# Patient Record
Sex: Male | Born: 1950 | Race: Black or African American | Hispanic: No | Smoking: Former smoker
Health system: Southern US, Community
[De-identification: ages and names within clinical notes are randomized; demographics above are authoritative.]

## PROBLEM LIST (undated history)

## (undated) DIAGNOSIS — E119 Type 2 diabetes mellitus without complications: Secondary | ICD-10-CM

## (undated) DIAGNOSIS — E669 Obesity, unspecified: Secondary | ICD-10-CM

## (undated) DIAGNOSIS — G4730 Sleep apnea, unspecified: Secondary | ICD-10-CM

## (undated) DIAGNOSIS — I4892 Unspecified atrial flutter: Principal | ICD-10-CM

## (undated) DIAGNOSIS — Z992 Dependence on renal dialysis: Secondary | ICD-10-CM

## (undated) DIAGNOSIS — K5790 Diverticulosis of intestine, part unspecified, without perforation or abscess without bleeding: Secondary | ICD-10-CM

## (undated) DIAGNOSIS — D649 Anemia, unspecified: Secondary | ICD-10-CM

## (undated) DIAGNOSIS — K922 Gastrointestinal hemorrhage, unspecified: Secondary | ICD-10-CM

## (undated) DIAGNOSIS — I480 Paroxysmal atrial fibrillation: Secondary | ICD-10-CM

## (undated) DIAGNOSIS — G8929 Other chronic pain: Secondary | ICD-10-CM

## (undated) DIAGNOSIS — M549 Dorsalgia, unspecified: Secondary | ICD-10-CM

## (undated) DIAGNOSIS — N186 End stage renal disease: Secondary | ICD-10-CM

## (undated) DIAGNOSIS — N289 Disorder of kidney and ureter, unspecified: Secondary | ICD-10-CM

## (undated) DIAGNOSIS — H269 Unspecified cataract: Secondary | ICD-10-CM

## (undated) DIAGNOSIS — I10 Essential (primary) hypertension: Secondary | ICD-10-CM

## (undated) DIAGNOSIS — E785 Hyperlipidemia, unspecified: Secondary | ICD-10-CM

## (undated) DIAGNOSIS — K219 Gastro-esophageal reflux disease without esophagitis: Secondary | ICD-10-CM

## (undated) HISTORY — DX: Paroxysmal atrial fibrillation: I48.0

## (undated) HISTORY — DX: Gastrointestinal hemorrhage, unspecified: K92.2

## (undated) HISTORY — DX: Sleep apnea, unspecified: G47.30

## (undated) HISTORY — PX: BACK SURGERY: SHX140

## (undated) HISTORY — DX: Anemia, unspecified: D64.9

## (undated) HISTORY — DX: End stage renal disease: N18.6

## (undated) HISTORY — DX: Diverticulosis of intestine, part unspecified, without perforation or abscess without bleeding: K57.90

## (undated) HISTORY — DX: Type 2 diabetes mellitus without complications: E11.9

## (undated) HISTORY — PX: EXPLORATORY LAPAROTOMY: SUR591

## (undated) HISTORY — DX: Unspecified cataract: H26.9

## (undated) HISTORY — PX: ATRIAL FLUTTER ABLATION: SHX5733

## (undated) HISTORY — DX: Essential (primary) hypertension: I10

## (undated) HISTORY — PX: SPINE SURGERY: SHX786

## (undated) HISTORY — DX: Obesity, unspecified: E66.9

## (undated) HISTORY — DX: Dorsalgia, unspecified: M54.9

## (undated) HISTORY — DX: Hyperlipidemia, unspecified: E78.5

## (undated) HISTORY — DX: Dependence on renal dialysis: Z99.2

## (undated) HISTORY — DX: Other chronic pain: G89.29

## (undated) HISTORY — DX: Gastro-esophageal reflux disease without esophagitis: K21.9

## (undated) HISTORY — PX: LUMBAR PERITONEAL SHUNT: SHX1984

---

## 1991-12-21 DIAGNOSIS — I10 Essential (primary) hypertension: Secondary | ICD-10-CM

## 1991-12-21 DIAGNOSIS — E119 Type 2 diabetes mellitus without complications: Secondary | ICD-10-CM

## 1991-12-21 HISTORY — DX: Type 2 diabetes mellitus without complications: E11.9

## 1991-12-21 HISTORY — DX: Essential (primary) hypertension: I10

## 1997-12-20 HISTORY — PX: CHOLECYSTECTOMY: SHX55

## 2001-04-28 ENCOUNTER — Emergency Department (HOSPITAL_COMMUNITY): Admission: EM | Admit: 2001-04-28 | Discharge: 2001-04-28 | Payer: Self-pay | Admitting: Emergency Medicine

## 2001-05-31 ENCOUNTER — Encounter (HOSPITAL_COMMUNITY): Admission: RE | Admit: 2001-05-31 | Discharge: 2001-06-30 | Payer: Self-pay | Admitting: Preventative Medicine

## 2001-10-18 ENCOUNTER — Emergency Department (HOSPITAL_COMMUNITY): Admission: EM | Admit: 2001-10-18 | Discharge: 2001-10-18 | Payer: Self-pay | Admitting: Emergency Medicine

## 2002-05-25 ENCOUNTER — Inpatient Hospital Stay (HOSPITAL_COMMUNITY): Admission: EM | Admit: 2002-05-25 | Discharge: 2002-05-28 | Payer: Self-pay | Admitting: Emergency Medicine

## 2002-05-25 ENCOUNTER — Encounter: Payer: Self-pay | Admitting: Emergency Medicine

## 2002-05-28 ENCOUNTER — Encounter: Payer: Self-pay | Admitting: Internal Medicine

## 2002-06-04 ENCOUNTER — Encounter: Payer: Self-pay | Admitting: Emergency Medicine

## 2002-06-04 ENCOUNTER — Emergency Department (HOSPITAL_COMMUNITY): Admission: EM | Admit: 2002-06-04 | Discharge: 2002-06-04 | Payer: Self-pay | Admitting: Emergency Medicine

## 2003-02-09 ENCOUNTER — Inpatient Hospital Stay (HOSPITAL_COMMUNITY): Admission: EM | Admit: 2003-02-09 | Discharge: 2003-02-12 | Payer: Self-pay | Admitting: Emergency Medicine

## 2003-02-09 ENCOUNTER — Encounter: Payer: Self-pay | Admitting: Emergency Medicine

## 2003-10-13 ENCOUNTER — Encounter: Payer: Self-pay | Admitting: Emergency Medicine

## 2003-10-13 ENCOUNTER — Emergency Department (HOSPITAL_COMMUNITY): Admission: EM | Admit: 2003-10-13 | Discharge: 2003-10-13 | Payer: Self-pay | Admitting: Emergency Medicine

## 2003-11-11 ENCOUNTER — Inpatient Hospital Stay (HOSPITAL_COMMUNITY): Admission: EM | Admit: 2003-11-11 | Discharge: 2003-11-13 | Payer: Self-pay | Admitting: Emergency Medicine

## 2004-05-20 ENCOUNTER — Ambulatory Visit (HOSPITAL_COMMUNITY): Admission: RE | Admit: 2004-05-20 | Discharge: 2004-05-20 | Payer: Self-pay | Admitting: Internal Medicine

## 2004-06-08 ENCOUNTER — Emergency Department (HOSPITAL_COMMUNITY): Admission: EM | Admit: 2004-06-08 | Discharge: 2004-06-08 | Payer: Self-pay | Admitting: Emergency Medicine

## 2005-02-26 ENCOUNTER — Emergency Department (HOSPITAL_COMMUNITY): Admission: EM | Admit: 2005-02-26 | Discharge: 2005-02-26 | Payer: Self-pay | Admitting: Emergency Medicine

## 2005-11-11 ENCOUNTER — Ambulatory Visit: Payer: Self-pay | Admitting: Cardiology

## 2005-11-11 ENCOUNTER — Inpatient Hospital Stay (HOSPITAL_COMMUNITY): Admission: EM | Admit: 2005-11-11 | Discharge: 2005-11-18 | Payer: Self-pay | Admitting: Emergency Medicine

## 2006-12-20 DIAGNOSIS — E785 Hyperlipidemia, unspecified: Secondary | ICD-10-CM

## 2006-12-20 HISTORY — DX: Hyperlipidemia, unspecified: E78.5

## 2006-12-20 HISTORY — PX: CATARACT EXTRACTION W/ INTRAOCULAR LENS  IMPLANT, BILATERAL: SHX1307

## 2006-12-21 ENCOUNTER — Ambulatory Visit: Payer: Self-pay | Admitting: Internal Medicine

## 2007-01-09 ENCOUNTER — Ambulatory Visit: Payer: Self-pay | Admitting: Internal Medicine

## 2007-01-09 ENCOUNTER — Encounter (INDEPENDENT_AMBULATORY_CARE_PROVIDER_SITE_OTHER): Payer: Self-pay | Admitting: *Deleted

## 2007-01-09 ENCOUNTER — Ambulatory Visit (HOSPITAL_COMMUNITY): Admission: RE | Admit: 2007-01-09 | Discharge: 2007-01-09 | Payer: Self-pay | Admitting: Internal Medicine

## 2007-02-27 ENCOUNTER — Ambulatory Visit (HOSPITAL_COMMUNITY): Admission: RE | Admit: 2007-02-27 | Discharge: 2007-02-27 | Payer: Self-pay | Admitting: Internal Medicine

## 2007-03-20 ENCOUNTER — Emergency Department (HOSPITAL_COMMUNITY): Admission: EM | Admit: 2007-03-20 | Discharge: 2007-03-20 | Payer: Self-pay | Admitting: Emergency Medicine

## 2007-07-02 ENCOUNTER — Ambulatory Visit: Payer: Self-pay | Admitting: Cardiology

## 2007-07-20 ENCOUNTER — Ambulatory Visit: Payer: Self-pay | Admitting: Cardiology

## 2007-07-25 ENCOUNTER — Ambulatory Visit (HOSPITAL_COMMUNITY): Admission: RE | Admit: 2007-07-25 | Discharge: 2007-07-25 | Payer: Self-pay | Admitting: Cardiology

## 2007-09-17 ENCOUNTER — Encounter: Admission: RE | Admit: 2007-09-17 | Discharge: 2007-09-17 | Payer: Self-pay | Admitting: Orthopedic Surgery

## 2007-11-10 ENCOUNTER — Emergency Department (HOSPITAL_COMMUNITY): Admission: EM | Admit: 2007-11-10 | Discharge: 2007-11-10 | Payer: Self-pay | Admitting: Emergency Medicine

## 2008-01-21 DIAGNOSIS — I480 Paroxysmal atrial fibrillation: Secondary | ICD-10-CM

## 2008-01-21 HISTORY — DX: Paroxysmal atrial fibrillation: I48.0

## 2008-01-30 ENCOUNTER — Ambulatory Visit: Payer: Self-pay | Admitting: Cardiology

## 2008-01-31 ENCOUNTER — Inpatient Hospital Stay (HOSPITAL_COMMUNITY): Admission: AD | Admit: 2008-01-31 | Discharge: 2008-02-02 | Payer: Self-pay | Admitting: Cardiology

## 2008-01-31 ENCOUNTER — Ambulatory Visit: Payer: Self-pay | Admitting: Cardiology

## 2008-02-08 ENCOUNTER — Emergency Department (HOSPITAL_COMMUNITY): Admission: EM | Admit: 2008-02-08 | Discharge: 2008-02-08 | Payer: Self-pay | Admitting: Emergency Medicine

## 2008-02-09 ENCOUNTER — Ambulatory Visit: Payer: Self-pay | Admitting: Cardiology

## 2008-03-12 ENCOUNTER — Ambulatory Visit: Payer: Self-pay | Admitting: Cardiology

## 2008-04-10 ENCOUNTER — Ambulatory Visit: Payer: Self-pay | Admitting: Cardiovascular Disease

## 2008-04-19 ENCOUNTER — Inpatient Hospital Stay (HOSPITAL_COMMUNITY): Admission: EM | Admit: 2008-04-19 | Discharge: 2008-04-20 | Payer: Self-pay | Admitting: Emergency Medicine

## 2008-04-19 ENCOUNTER — Ambulatory Visit: Payer: Self-pay | Admitting: Internal Medicine

## 2008-04-19 DIAGNOSIS — K922 Gastrointestinal hemorrhage, unspecified: Secondary | ICD-10-CM

## 2008-04-19 HISTORY — DX: Gastrointestinal hemorrhage, unspecified: K92.2

## 2008-05-14 ENCOUNTER — Ambulatory Visit: Payer: Self-pay | Admitting: Internal Medicine

## 2008-05-14 DIAGNOSIS — I959 Hypotension, unspecified: Secondary | ICD-10-CM

## 2008-05-14 DIAGNOSIS — E785 Hyperlipidemia, unspecified: Secondary | ICD-10-CM

## 2008-05-14 DIAGNOSIS — N2581 Secondary hyperparathyroidism of renal origin: Secondary | ICD-10-CM | POA: Insufficient documentation

## 2008-05-14 DIAGNOSIS — I10 Essential (primary) hypertension: Secondary | ICD-10-CM

## 2008-05-14 DIAGNOSIS — M545 Low back pain: Secondary | ICD-10-CM

## 2008-05-14 DIAGNOSIS — D638 Anemia in other chronic diseases classified elsewhere: Secondary | ICD-10-CM | POA: Insufficient documentation

## 2008-05-14 DIAGNOSIS — K219 Gastro-esophageal reflux disease without esophagitis: Secondary | ICD-10-CM | POA: Insufficient documentation

## 2008-05-14 DIAGNOSIS — J309 Allergic rhinitis, unspecified: Secondary | ICD-10-CM | POA: Insufficient documentation

## 2008-05-14 DIAGNOSIS — Z8719 Personal history of other diseases of the digestive system: Secondary | ICD-10-CM

## 2008-05-14 DIAGNOSIS — N186 End stage renal disease: Secondary | ICD-10-CM | POA: Insufficient documentation

## 2008-05-14 DIAGNOSIS — E1122 Type 2 diabetes mellitus with diabetic chronic kidney disease: Secondary | ICD-10-CM

## 2008-05-14 LAB — CONVERTED CEMR LAB
Blood Glucose, Fingerstick: 222
Hemoglobin: 12.6 g/dL

## 2008-05-21 ENCOUNTER — Ambulatory Visit (HOSPITAL_COMMUNITY): Admission: RE | Admit: 2008-05-21 | Discharge: 2008-05-21 | Payer: Self-pay | Admitting: Internal Medicine

## 2008-05-21 ENCOUNTER — Ambulatory Visit: Payer: Self-pay | Admitting: Internal Medicine

## 2008-05-29 ENCOUNTER — Encounter (INDEPENDENT_AMBULATORY_CARE_PROVIDER_SITE_OTHER): Payer: Self-pay | Admitting: Internal Medicine

## 2008-07-11 ENCOUNTER — Ambulatory Visit: Payer: Self-pay | Admitting: Cardiology

## 2008-07-11 ENCOUNTER — Ambulatory Visit (HOSPITAL_COMMUNITY): Admission: RE | Admit: 2008-07-11 | Discharge: 2008-07-11 | Payer: Self-pay | Admitting: Cardiology

## 2008-07-12 ENCOUNTER — Encounter (INDEPENDENT_AMBULATORY_CARE_PROVIDER_SITE_OTHER): Payer: Self-pay | Admitting: Internal Medicine

## 2008-07-12 ENCOUNTER — Ambulatory Visit: Payer: Self-pay | Admitting: Internal Medicine

## 2008-07-12 ENCOUNTER — Ambulatory Visit (HOSPITAL_COMMUNITY): Payer: Self-pay | Admitting: Internal Medicine

## 2008-07-12 ENCOUNTER — Encounter (HOSPITAL_COMMUNITY): Admission: RE | Admit: 2008-07-12 | Discharge: 2008-08-11 | Payer: Self-pay | Admitting: Internal Medicine

## 2008-07-12 DIAGNOSIS — N498 Inflammatory disorders of other specified male genital organs: Secondary | ICD-10-CM | POA: Insufficient documentation

## 2008-07-13 ENCOUNTER — Encounter (INDEPENDENT_AMBULATORY_CARE_PROVIDER_SITE_OTHER): Payer: Self-pay | Admitting: Internal Medicine

## 2008-07-23 ENCOUNTER — Ambulatory Visit: Payer: Self-pay | Admitting: Internal Medicine

## 2008-08-14 ENCOUNTER — Encounter (INDEPENDENT_AMBULATORY_CARE_PROVIDER_SITE_OTHER): Payer: Self-pay | Admitting: Internal Medicine

## 2008-09-24 ENCOUNTER — Ambulatory Visit: Payer: Self-pay | Admitting: Internal Medicine

## 2008-09-26 ENCOUNTER — Encounter (INDEPENDENT_AMBULATORY_CARE_PROVIDER_SITE_OTHER): Payer: Self-pay | Admitting: Internal Medicine

## 2008-10-10 ENCOUNTER — Ambulatory Visit: Payer: Self-pay | Admitting: Cardiology

## 2008-11-07 ENCOUNTER — Ambulatory Visit (HOSPITAL_COMMUNITY): Admission: RE | Admit: 2008-11-07 | Discharge: 2008-11-07 | Payer: Self-pay | Admitting: Cardiology

## 2008-11-19 ENCOUNTER — Encounter: Payer: Self-pay | Admitting: Orthopedic Surgery

## 2008-12-04 ENCOUNTER — Telehealth (INDEPENDENT_AMBULATORY_CARE_PROVIDER_SITE_OTHER): Payer: Self-pay | Admitting: *Deleted

## 2008-12-04 ENCOUNTER — Ambulatory Visit: Payer: Self-pay | Admitting: Internal Medicine

## 2008-12-04 DIAGNOSIS — M79609 Pain in unspecified limb: Secondary | ICD-10-CM

## 2008-12-20 DIAGNOSIS — G4730 Sleep apnea, unspecified: Secondary | ICD-10-CM

## 2008-12-20 HISTORY — DX: Sleep apnea, unspecified: G47.30

## 2008-12-30 ENCOUNTER — Encounter (INDEPENDENT_AMBULATORY_CARE_PROVIDER_SITE_OTHER): Payer: Self-pay

## 2008-12-30 LAB — CONVERTED CEMR LAB
ALT: 41 units/L
Alkaline Phosphatase: 101 units/L
Cholesterol: 155 mg/dL
Eosinophils Relative: 2 %
HDL: 26 mg/dL
Total Bilirubin: 0.6 mg/dL
Triglyceride fasting, serum: 531 mg/dL
WBC: 8 10*3/uL

## 2009-02-07 ENCOUNTER — Inpatient Hospital Stay (HOSPITAL_COMMUNITY): Admission: AD | Admit: 2009-02-07 | Discharge: 2009-02-11 | Payer: Self-pay

## 2009-02-07 ENCOUNTER — Ambulatory Visit: Payer: Self-pay | Admitting: Cardiology

## 2009-02-10 ENCOUNTER — Encounter (INDEPENDENT_AMBULATORY_CARE_PROVIDER_SITE_OTHER): Payer: Self-pay | Admitting: Family Medicine

## 2009-08-18 ENCOUNTER — Ambulatory Visit (HOSPITAL_COMMUNITY): Admission: RE | Admit: 2009-08-18 | Discharge: 2009-08-18 | Payer: Self-pay | Admitting: Cardiology

## 2009-08-18 ENCOUNTER — Ambulatory Visit: Payer: Self-pay | Admitting: Cardiology

## 2009-08-18 DIAGNOSIS — I4891 Unspecified atrial fibrillation: Secondary | ICD-10-CM | POA: Insufficient documentation

## 2009-08-18 DIAGNOSIS — M109 Gout, unspecified: Secondary | ICD-10-CM | POA: Insufficient documentation

## 2009-08-18 DIAGNOSIS — E669 Obesity, unspecified: Secondary | ICD-10-CM

## 2009-08-18 DIAGNOSIS — G4733 Obstructive sleep apnea (adult) (pediatric): Secondary | ICD-10-CM

## 2009-08-18 LAB — CONVERTED CEMR LAB
ALT: 22 units/L
Albumin: 4.1 g/dL
Alkaline Phosphatase: 90 units/L
Creatinine, Ser: 10.43 mg/dL
LDL Cholesterol: 22 mg/dL

## 2009-08-20 ENCOUNTER — Encounter (INDEPENDENT_AMBULATORY_CARE_PROVIDER_SITE_OTHER): Payer: Self-pay | Admitting: *Deleted

## 2009-08-20 LAB — CONVERTED CEMR LAB
AST: 20 units/L
Alkaline Phosphatase: 82 units/L
Bilirubin, Direct: 0.1 mg/dL
HDL: 27 mg/dL
Triglycerides: 373 mg/dL

## 2009-08-21 ENCOUNTER — Encounter: Payer: Self-pay | Admitting: Cardiology

## 2009-08-21 ENCOUNTER — Encounter (INDEPENDENT_AMBULATORY_CARE_PROVIDER_SITE_OTHER): Payer: Self-pay | Admitting: *Deleted

## 2009-08-21 LAB — CONVERTED CEMR LAB
Alkaline Phosphatase: 82 units/L
Bilirubin, Direct: 0.1 mg/dL
Cholesterol: 186 mg/dL
LDL Cholesterol: 84 mg/dL
Triglycerides: 373 mg/dL

## 2009-08-22 LAB — CONVERTED CEMR LAB
ALT: 18 units/L (ref 0–53)
Albumin: 4 g/dL (ref 3.5–5.2)
Alkaline Phosphatase: 82 units/L (ref 39–117)
Indirect Bilirubin: 0.2 mg/dL (ref 0.0–0.9)
Total Bilirubin: 0.3 mg/dL (ref 0.3–1.2)
Total CHOL/HDL Ratio: 6.9
Total Protein: 6.2 g/dL (ref 6.0–8.3)

## 2009-08-28 ENCOUNTER — Encounter (INDEPENDENT_AMBULATORY_CARE_PROVIDER_SITE_OTHER): Payer: Self-pay | Admitting: *Deleted

## 2009-09-02 ENCOUNTER — Encounter: Payer: Self-pay | Admitting: Family Medicine

## 2009-09-05 ENCOUNTER — Encounter: Payer: Self-pay | Admitting: Family Medicine

## 2009-09-10 ENCOUNTER — Ambulatory Visit: Payer: Self-pay | Admitting: Cardiology

## 2009-09-18 ENCOUNTER — Encounter (INDEPENDENT_AMBULATORY_CARE_PROVIDER_SITE_OTHER): Payer: Self-pay | Admitting: Internal Medicine

## 2009-09-24 ENCOUNTER — Ambulatory Visit: Payer: Self-pay | Admitting: Family Medicine

## 2009-09-24 LAB — CONVERTED CEMR LAB: Glucose, Bld: 264 mg/dL

## 2009-10-05 ENCOUNTER — Observation Stay (HOSPITAL_COMMUNITY): Admission: EM | Admit: 2009-10-05 | Discharge: 2009-10-07 | Payer: Self-pay | Admitting: Emergency Medicine

## 2009-10-05 DIAGNOSIS — B351 Tinea unguium: Secondary | ICD-10-CM | POA: Insufficient documentation

## 2009-10-06 ENCOUNTER — Telehealth: Payer: Self-pay | Admitting: Family Medicine

## 2009-10-21 ENCOUNTER — Ambulatory Visit: Payer: Self-pay | Admitting: Family Medicine

## 2009-12-02 ENCOUNTER — Ambulatory Visit: Payer: Self-pay | Admitting: Family Medicine

## 2010-01-06 ENCOUNTER — Ambulatory Visit: Payer: Self-pay | Admitting: Pulmonary Disease

## 2010-01-06 ENCOUNTER — Ambulatory Visit: Payer: Self-pay | Admitting: Cardiovascular Disease

## 2010-01-06 ENCOUNTER — Inpatient Hospital Stay (HOSPITAL_COMMUNITY): Admission: EM | Admit: 2010-01-06 | Discharge: 2010-01-12 | Payer: Self-pay | Admitting: Pulmonary Disease

## 2010-01-06 ENCOUNTER — Encounter: Payer: Self-pay | Admitting: Emergency Medicine

## 2010-01-06 ENCOUNTER — Ambulatory Visit: Payer: Self-pay | Admitting: Infectious Diseases

## 2010-01-09 ENCOUNTER — Encounter: Payer: Self-pay | Admitting: Internal Medicine

## 2010-01-16 ENCOUNTER — Encounter (INDEPENDENT_AMBULATORY_CARE_PROVIDER_SITE_OTHER): Payer: Self-pay | Admitting: *Deleted

## 2010-01-21 ENCOUNTER — Ambulatory Visit: Payer: Self-pay | Admitting: Family Medicine

## 2010-01-28 ENCOUNTER — Telehealth: Payer: Self-pay | Admitting: Family Medicine

## 2010-01-29 ENCOUNTER — Encounter: Payer: Self-pay | Admitting: Family Medicine

## 2010-01-30 ENCOUNTER — Encounter (INDEPENDENT_AMBULATORY_CARE_PROVIDER_SITE_OTHER): Payer: Self-pay | Admitting: *Deleted

## 2010-01-31 DIAGNOSIS — J018 Other acute sinusitis: Secondary | ICD-10-CM

## 2010-02-20 ENCOUNTER — Ambulatory Visit: Payer: Self-pay | Admitting: Physician Assistant

## 2010-02-20 DIAGNOSIS — J209 Acute bronchitis, unspecified: Secondary | ICD-10-CM | POA: Insufficient documentation

## 2010-05-05 ENCOUNTER — Ambulatory Visit: Payer: Self-pay | Admitting: Family Medicine

## 2010-05-05 DIAGNOSIS — R109 Unspecified abdominal pain: Secondary | ICD-10-CM

## 2010-05-08 ENCOUNTER — Ambulatory Visit (HOSPITAL_COMMUNITY): Admission: RE | Admit: 2010-05-08 | Discharge: 2010-05-08 | Payer: Self-pay | Admitting: Family Medicine

## 2010-06-11 ENCOUNTER — Ambulatory Visit: Payer: Self-pay | Admitting: Family Medicine

## 2010-06-11 DIAGNOSIS — R5383 Other fatigue: Secondary | ICD-10-CM

## 2010-06-11 DIAGNOSIS — R5381 Other malaise: Secondary | ICD-10-CM | POA: Insufficient documentation

## 2010-06-11 DIAGNOSIS — K649 Unspecified hemorrhoids: Secondary | ICD-10-CM | POA: Insufficient documentation

## 2010-07-07 ENCOUNTER — Telehealth: Payer: Self-pay | Admitting: Orthopedic Surgery

## 2010-07-14 ENCOUNTER — Encounter (INDEPENDENT_AMBULATORY_CARE_PROVIDER_SITE_OTHER): Payer: Self-pay | Admitting: *Deleted

## 2010-07-15 ENCOUNTER — Encounter (INDEPENDENT_AMBULATORY_CARE_PROVIDER_SITE_OTHER): Payer: Self-pay | Admitting: *Deleted

## 2010-08-26 LAB — CONVERTED CEMR LAB
ALT: 15 units/L
AST: 14 units/L
Albumin: 4.3 g/dL
Alkaline Phosphatase: 65 units/L
CO2: 20 meq/L
Creatinine, Ser: 10.52 mg/dL
HCT: 36.1 %
Hemoglobin: 11.8 g/dL
MCV: 91.8 fL
Platelets: 193 10*3/uL
WBC: 8.2 10*3/uL

## 2010-09-17 ENCOUNTER — Ambulatory Visit: Payer: Self-pay | Admitting: Cardiology

## 2010-09-17 ENCOUNTER — Encounter (INDEPENDENT_AMBULATORY_CARE_PROVIDER_SITE_OTHER): Payer: Self-pay | Admitting: *Deleted

## 2010-09-17 ENCOUNTER — Ambulatory Visit (HOSPITAL_COMMUNITY): Admission: RE | Admit: 2010-09-17 | Discharge: 2010-09-17 | Payer: Self-pay | Admitting: Cardiology

## 2010-09-18 ENCOUNTER — Encounter: Payer: Self-pay | Admitting: Cardiology

## 2010-09-24 ENCOUNTER — Ambulatory Visit: Payer: Self-pay | Admitting: Family Medicine

## 2010-09-24 DIAGNOSIS — M25519 Pain in unspecified shoulder: Secondary | ICD-10-CM

## 2010-09-25 ENCOUNTER — Telehealth (INDEPENDENT_AMBULATORY_CARE_PROVIDER_SITE_OTHER): Payer: Self-pay | Admitting: *Deleted

## 2010-09-28 ENCOUNTER — Encounter (INDEPENDENT_AMBULATORY_CARE_PROVIDER_SITE_OTHER): Payer: Self-pay | Admitting: *Deleted

## 2010-09-29 ENCOUNTER — Encounter: Payer: Self-pay | Admitting: Family Medicine

## 2010-10-02 ENCOUNTER — Encounter: Admission: RE | Admit: 2010-10-02 | Discharge: 2010-10-02 | Payer: Self-pay | Admitting: Family Medicine

## 2010-10-08 ENCOUNTER — Encounter: Payer: Self-pay | Admitting: Family Medicine

## 2010-10-19 ENCOUNTER — Emergency Department (HOSPITAL_COMMUNITY): Admission: EM | Admit: 2010-10-19 | Discharge: 2010-10-19 | Payer: Self-pay | Admitting: Emergency Medicine

## 2010-10-19 ENCOUNTER — Telehealth: Payer: Self-pay | Admitting: Family Medicine

## 2010-10-23 ENCOUNTER — Encounter: Admission: RE | Admit: 2010-10-23 | Discharge: 2010-10-23 | Payer: Self-pay | Admitting: Nephrology

## 2010-10-29 ENCOUNTER — Telehealth: Payer: Self-pay | Admitting: Family Medicine

## 2010-11-02 ENCOUNTER — Ambulatory Visit (HOSPITAL_COMMUNITY): Admission: RE | Admit: 2010-11-02 | Discharge: 2010-11-02 | Payer: Self-pay | Admitting: Family Medicine

## 2010-11-02 ENCOUNTER — Ambulatory Visit: Payer: Self-pay | Admitting: Family Medicine

## 2010-11-02 DIAGNOSIS — M25559 Pain in unspecified hip: Secondary | ICD-10-CM

## 2010-11-03 ENCOUNTER — Encounter: Payer: Self-pay | Admitting: Family Medicine

## 2010-11-06 ENCOUNTER — Encounter: Payer: Self-pay | Admitting: Family Medicine

## 2010-11-26 ENCOUNTER — Emergency Department (HOSPITAL_COMMUNITY)
Admission: EM | Admit: 2010-11-26 | Discharge: 2010-11-26 | Payer: Self-pay | Source: Home / Self Care | Admitting: Emergency Medicine

## 2010-12-02 IMAGING — CR DG CHEST 1V PORT
1 series · 1 of 1 positions shown · non-contrast
Comparison: Portable chest x-ray earlier today 9991 hours.

CLINICAL DATA: Central venous catheter placement.  Sepsis.

PORTABLE CHEST - 1 VIEW [DATE]/9166 6962 hours:

[AP]
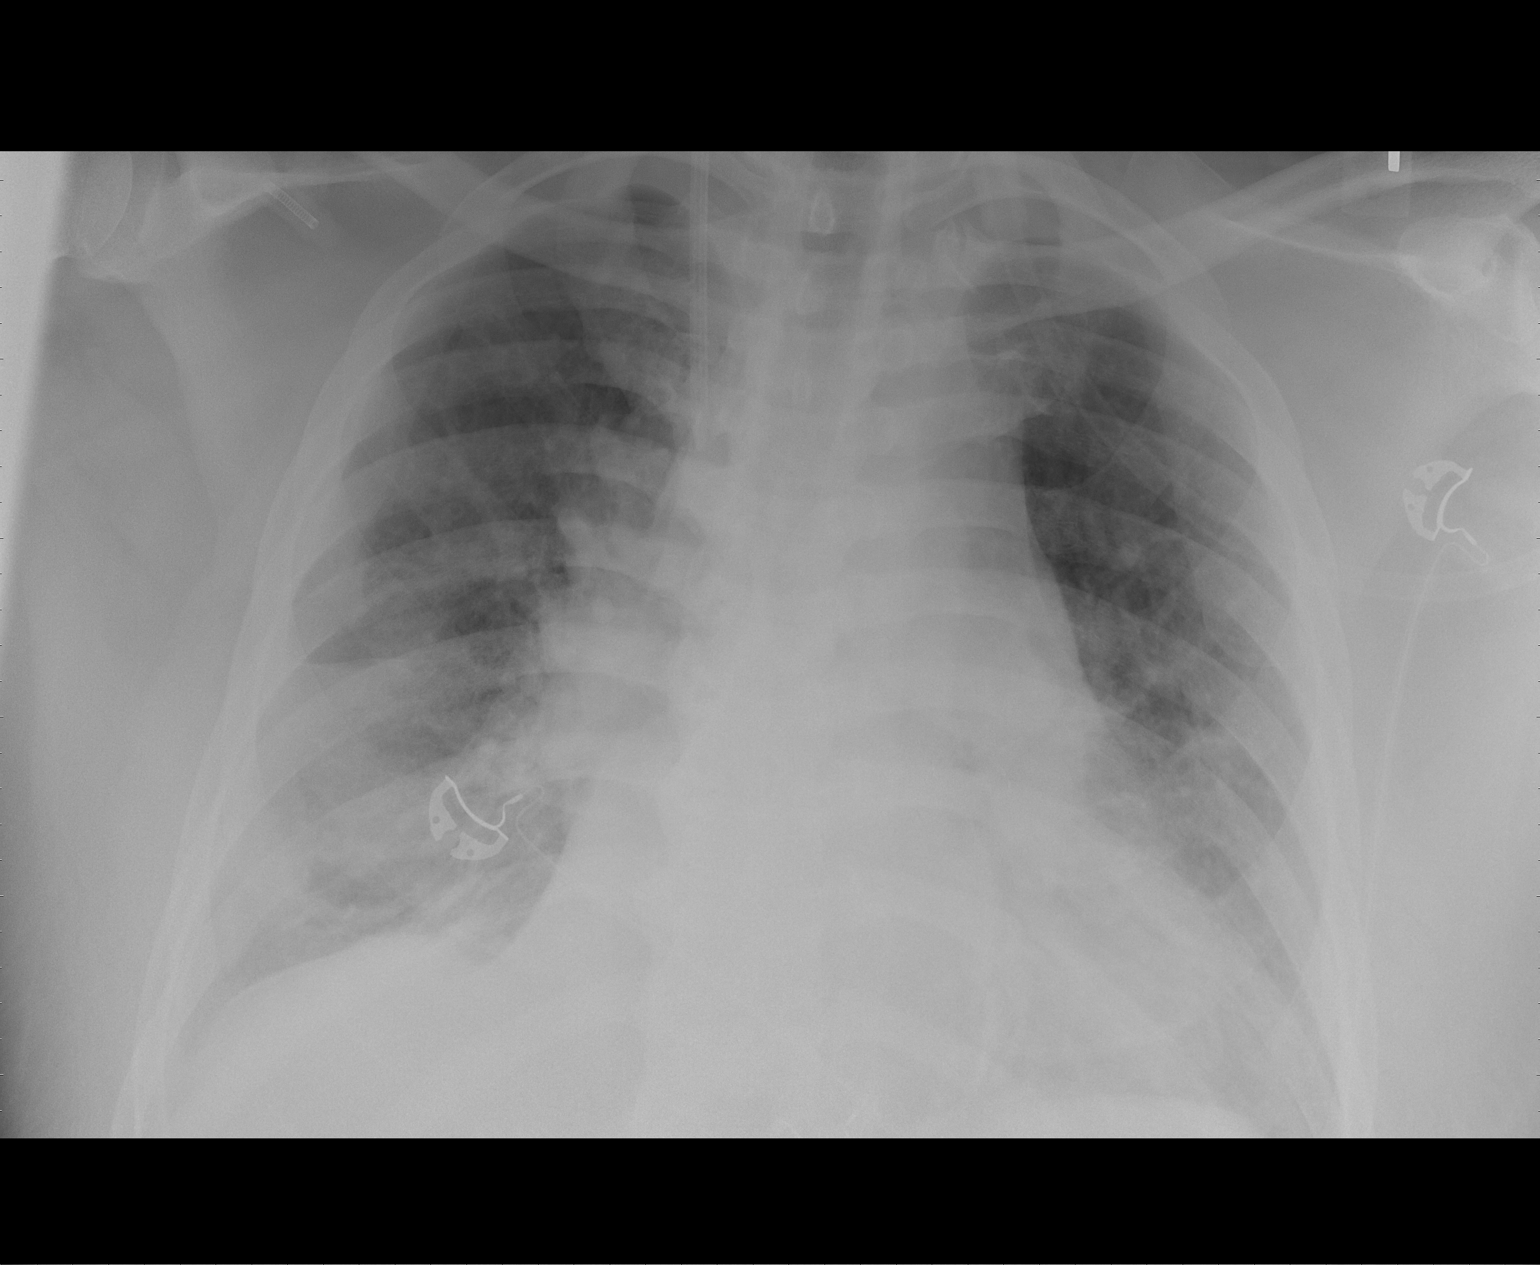

[1 of 1 positions shown; findings below may reference images not displayed]

FINDINGS: Right jugular central venous catheter tip in the upper
SVC.  No evidence of pneumothorax or mediastinal hematoma.
Increased asymmetric airspace opacities in the right lung base
since earlier in the morning.  Pulmonary venous hypertension
without overt edema currently.  Heart enlarged but stable.
IMPRESSION: 1.  Right jugular central venous catheter tip in the upper SVC.  No
acute complicating features.
2.  Worsening consolidation in the right lung base consistent with
pneumonia.

## 2010-12-03 IMAGING — US US ABDOMEN COMPLETE
1 series · 14 of 25 positions shown · non-contrast
Comparison: CT scan 01/06/2010

CLINICAL DATA: Sepsis.

COMPLETE ABDOMINAL ULTRASOUND

[Series 1: us abdomen complete · 0.30mm/px · 14 of 35 slices shown]
[im 1/35]
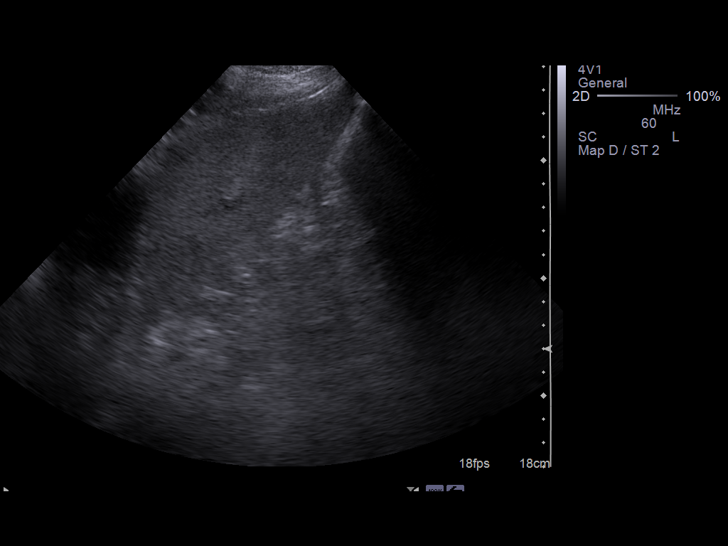
[im 3/35]
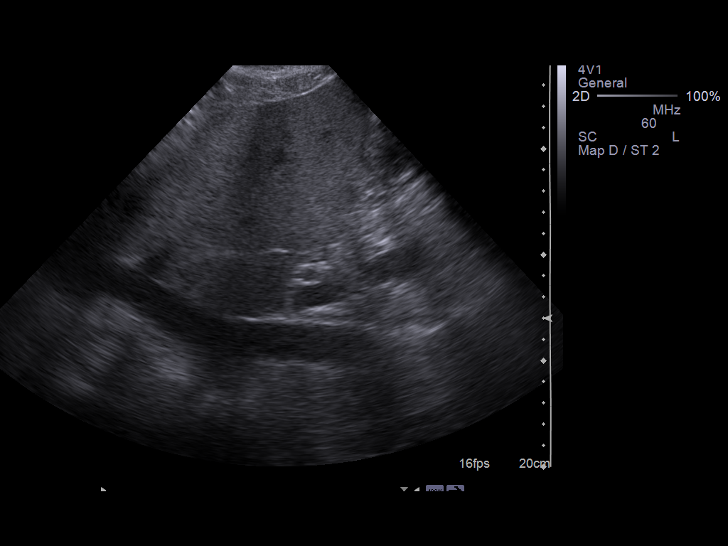
[im 6/35]
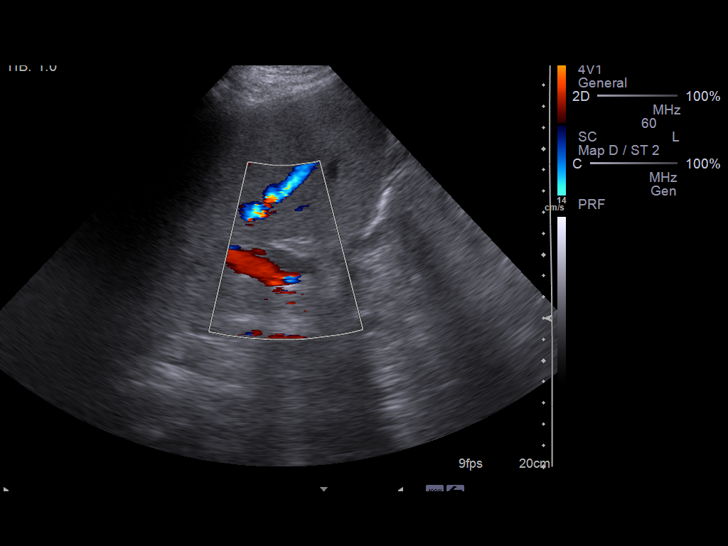
[im 9/35]
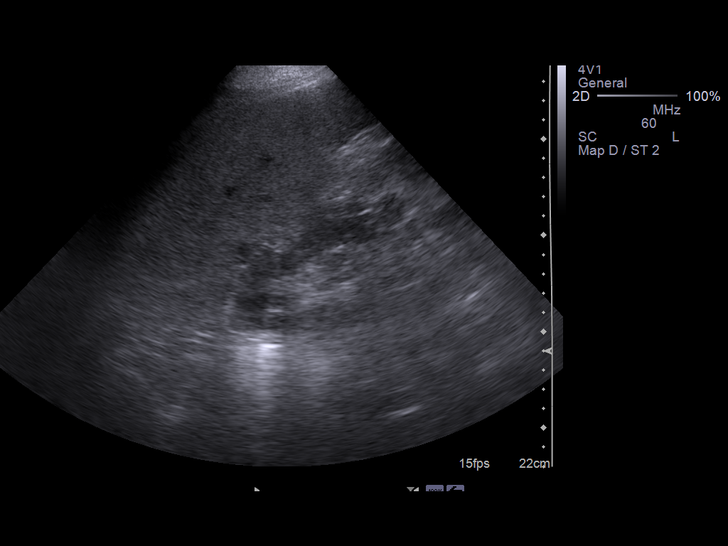
[im 12/35]
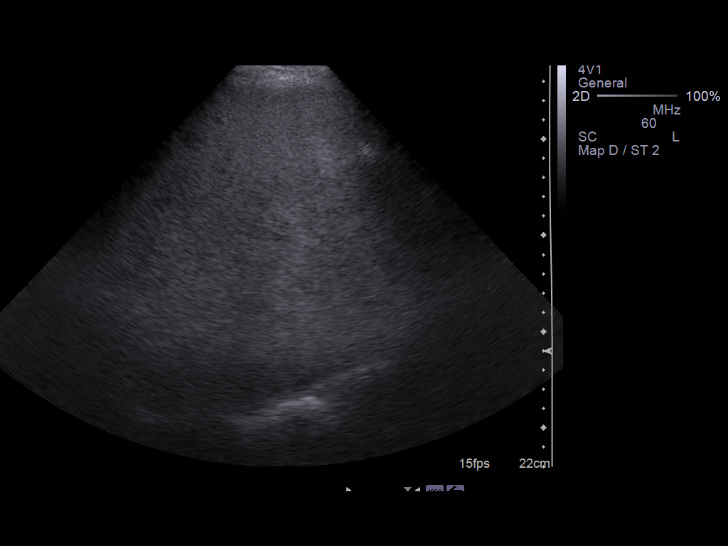
[im 13/35]
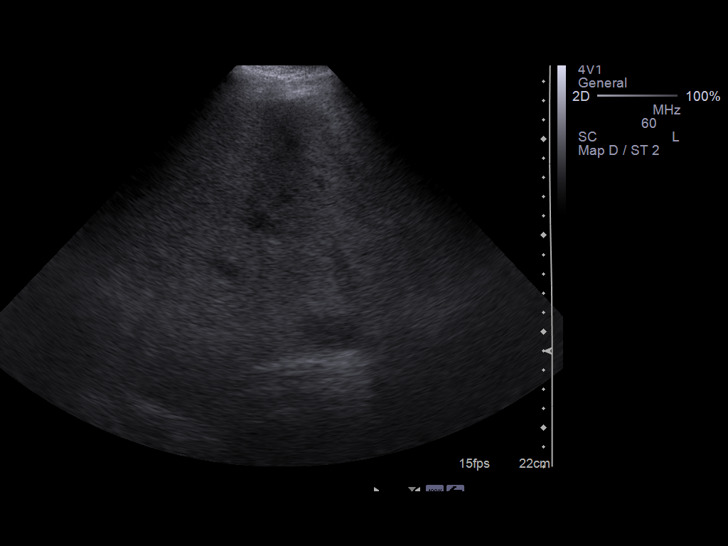
[im 16/35]
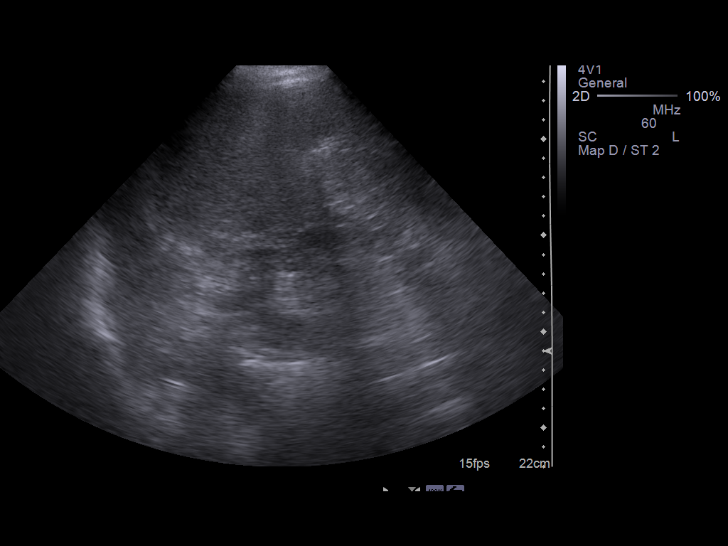
[im 19/35]
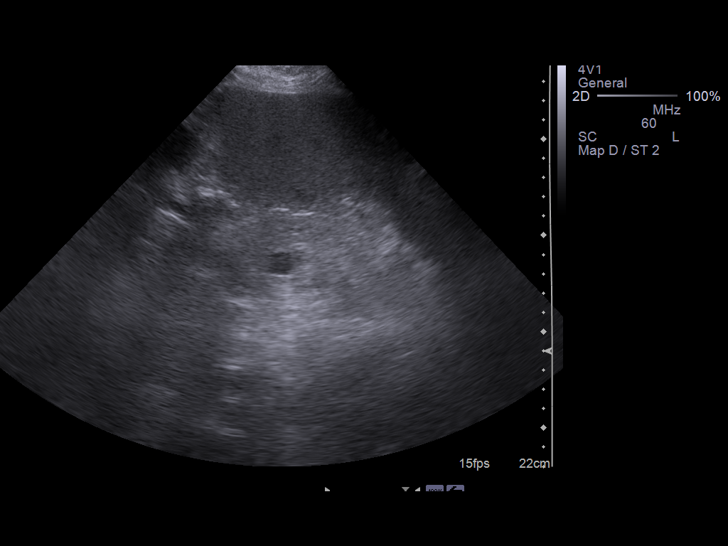
[im 22/35]
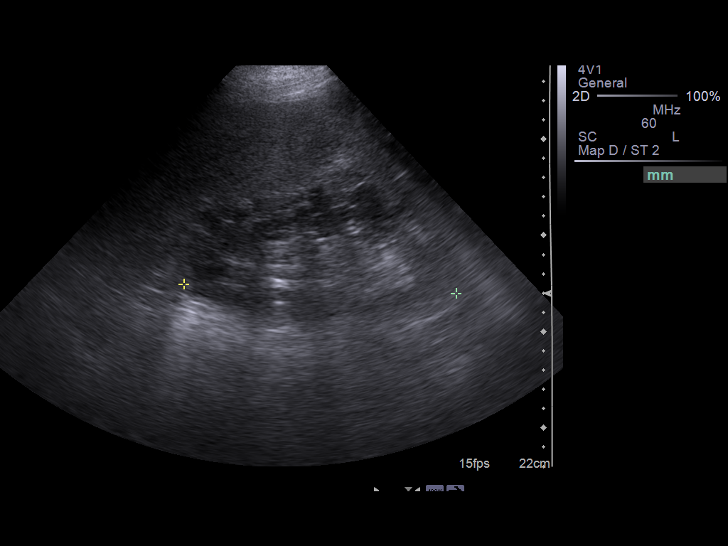
[im 23/35]
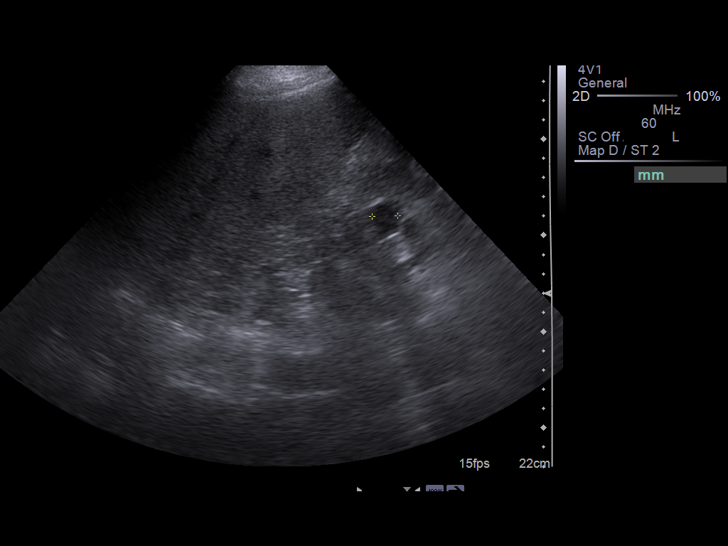
[im 26/35]
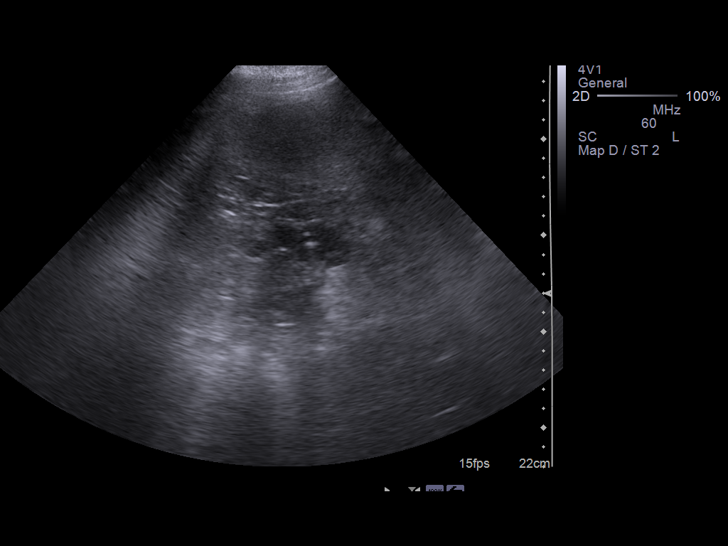
[im 29/35]
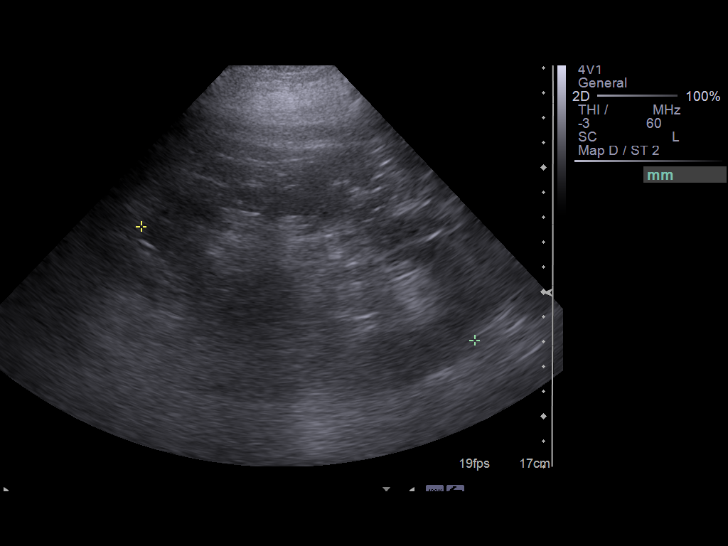
[im 32/35]
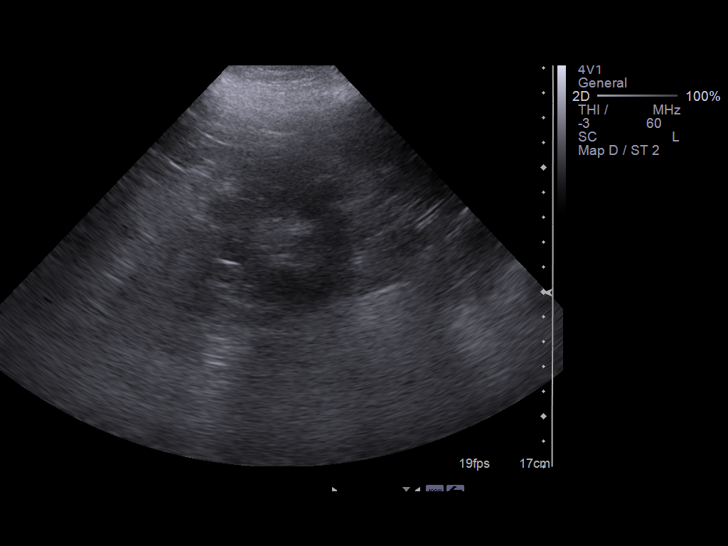
[im 35/35]
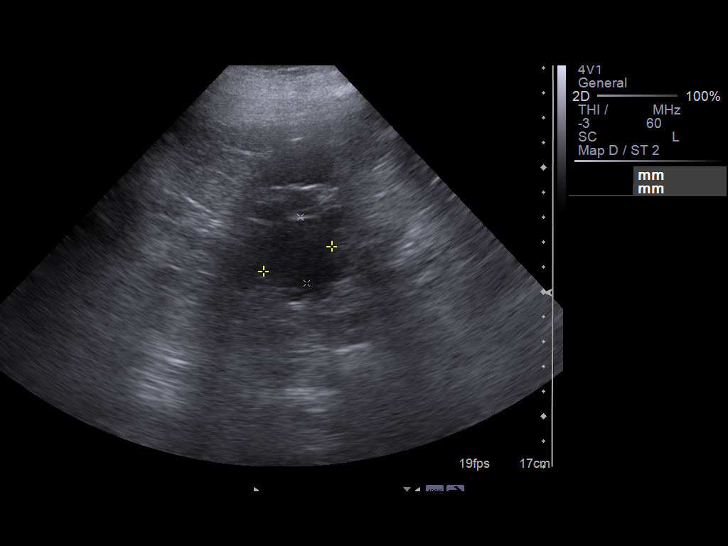

[14 of 25 positions shown; findings below may reference images not displayed]

FINDINGS: Gallbladder:  Status post cholecystectomy.

Common bile duct:  7.5 mm in caliber.  This is within normal limits
given the prior cholecystectomy.

Liver:  Diffuse fatty infiltration of the liver.  No focal hepatic
lesions.  No intrahepatic duct dilatation.

IVC:  Normal caliber.

Pancreas:  Incompletely visualized but normal on the recent CT
scan.

Spleen:  Normal in size and echogenicity without focal lesions.

Right Kidney:  14.8 cm in length.  Increased echogenicity and
numerous cysts.  No hydronephrosis.

Left Kidney:  14.1 cm in length.  Increased echogenicity.  No
hydronephrosis.  Numerous renal cysts and small calcifications.

Abdominal aorta:  Normal in caliber.
IMPRESSION: 1.  Status post cholecystectomy.
2.  Normal caliber common bile duct.
3.  Diffuse fatty infiltration of the liver.
4.  Enlarged echogenic kidneys with numerous cysts and
calcifications.  No hydronephrosis.

## 2011-01-12 ENCOUNTER — Ambulatory Visit
Admission: RE | Admit: 2011-01-12 | Discharge: 2011-01-12 | Payer: Self-pay | Source: Home / Self Care | Attending: Family Medicine | Admitting: Family Medicine

## 2011-01-13 ENCOUNTER — Encounter: Payer: Self-pay | Admitting: Family Medicine

## 2011-01-19 NOTE — Assessment & Plan Note (Signed)
Summary: ov   Vital Signs:  Patient profile:   60 year old male Height:      74 inches Weight:      284 pounds BMI:     36.60 O2 Sat:      97 % Pulse rate:   110 / minute Pulse rhythm:   regular Resp:     16 per minute BP sitting:   130 / 58  Vitals Entered By: Everitt Amber (January 21, 2010 3:57 PM)  Nutrition Counseling: Patient's BMI is greater than 25 and therefore counseled on weight management options. CC: Follow up chronic problems Is Patient Diabetic? Yes   Primary Care Provider:  Syliva Overman MD  CC:  Follow up chronic problems.  History of Present Illness: pt hospitalised for approx 10 days with pneumonia was d/c  approx 2 days ago andis now on oxygen. He still ahs a cough, no fever or chills,  He continues to experience wide fluctuations in his blood sugars, he does not eat consistentluy, and he is follwed by endo for this. 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 He denies any falls.  Current Medications (verified): 1)  Omeprazole 20 Mg  Cpdr (Omeprazole) .Marland Kitchen.. 1 By Mouth Once Daily 2)  Baby Aspirin 81 Mg  Chew (Aspirin) .Marland Kitchen.. 1 By Mouth Once Daily 3)  Sensipar 30 Mg  Tabs (Cinacalcet Hcl) .Marland Kitchen.. 1 By Mouth Once Daily 4)  Senna Concentrate 8.6 Mg  Tabs (Sennosides) .Marland Kitchen.. 1 By Mouth Two Times A Day 5)  Gabapentin 300 Mg  Caps (Gabapentin) .Marland Kitchen.. 1 By Mouth Two Times A Day 6)  Nephro-Vite Rx 1 Mg  Tabs (B Complex-C-Folic Acid) .Marland Kitchen.. 1 By Mouth Once Daily 7)  Glipizide 10 Mg Tabs (Glipizide) .... Take 1 Tab Bid 8)  Allopurinol 100 Mg  Tabs (Allopurinol) .Marland Kitchen.. 1 By Mouth Once Daily 9)  Sea-Omega 300 Mg  Caps (Omega-3 Fatty Acids) .... 2 By Mouth Three Times A Day 10)  Lisinopril 40 Mg Tabs (Lisinopril) .... 1/2 Tab Two Times A Day 11)  Gemfibrozil 600 Mg Tabs (Gemfibrozil) .... Take 1 Tab Two Times A Day 12)  Trental 400 Mg Cr-Tabs (Pentoxifylline) .... Take 1 Tab Three Times A Day 13)  Renvela 800 Mg Tabs  (Sevelamer Carbonate) .... Take 5 Tabs Three Times A Day Before Meals 14)  Zyrtec Allergy 10 Mg Tabs (Cetirizine Hcl) .... One Tab By Mouth Qd 15)  Nephro-Vite 0.8 Mg Tabs (B Complex-C-Folic Acid) .... One Tab By Mouth Qd 16)  Lancets and Strips For Precison Meter .... Two Times A Day Testing 17)  Tramadol Hcl 50 Mg Tabs (Tramadol Hcl) .... One Tablet Three Times Daily As Needed 18)  Pacerone 200 Mg Tabs (Amiodarone Hcl) .... Take 1 Tablet By Mouth Once A Day  Allergies (verified): 1)  Pcn  Review of Systems      See HPI General:  Complains of fatigue, sweats, and weakness; denies chills and fever. Eyes:  Denies discharge and light sensitivity. ENT:  Complains of nasal congestion and sinus pressure; green nasal drainage persits. CV:  Denies chest pain or discomfort, palpitations, and swelling of feet. GI:  Denies abdominal pain, constipation, diarrhea, nausea, and vomiting. GU:  esrd on dialysis. MS:  Complains of joint pain, muscle aches, and muscle weakness. Derm:  Denies itching and rash. Neuro:  Complains of poor balance, tingling, and weakness; denies falling down. Psych:  Complains of anxiety and depression; denies sense of great  danger, suicidal thoughts/plans, thoughts of violence, and unusual visions or sounds. Endo:  See HPI. Heme:  Denies abnormal bruising and bleeding. Allergy:  Denies hives or rash.  Physical Exam  General:  Well-developed, obese,in no acute distress; alert,appropriate and cooperative throughout examination HEENT: No facial asymmetry,  EOMI, frontal and maxillary  sinus tenderness, TM's Clear, oropharynx  pink and moist.   Chest: decreased air entry with bilateral crackles , no wheezes CVS: S1, S2, No murmurs, No S3.   Abd: Soft, Nontender.  MS: decreased  ROM spine, hips, shoulders and knees.  Ext: No edema.   CNS: CN 2-12 intact, power tone and sensation normal throughout.   Skin: Intact, no visible lesions or rashes.  Psych: Good eye contact,  normal affect.  Memory intact, not anxious or depressed appearing.    Impression & Recommendations:  Problem # 1:  OTHER ACUTE SINUSITIS (ICD-461.8) Assessment Comment Only doxycycline fopr an additional 2 weeks prescribed  Problem # 2:  DIABETES MELLITUS, TYPE II (ICD-250.00) Assessment: Comment Only  His updated medication list for this problem includes:    Baby Aspirin 81 Mg Chew (Aspirin) .Marland Kitchen... 1 by mouth once daily    Glipizide 10 Mg Tabs (Glipizide) .Marland Kitchen... Take 1 tab bid    Lisinopril 40 Mg Tabs (Lisinopril) .Marland Kitchen... 1/2 tab two times a day  Orders: Glucose, (CBG) (82962) Hemoglobin A1C (83036) Insulin 5 units (Z6109) Admin of Therapeutic Inj  intramuscular or subcutaneous (60454)  Labs Reviewed: Creat: 10.43 (08/18/2009)    Reviewed HgBA1c results: 7.9 (01/21/2010)  Problem # 3:  HYPERTENSION (ICD-401.9) Assessment: Unchanged  His updated medication list for this problem includes:    Lisinopril 40 Mg Tabs (Lisinopril) .Marland Kitchen... 1/2 tab two times a day  BP today: 130/58 Prior BP: 124/60 (12/02/2009)  Labs Reviewed: K+: 3.9 (08/18/2009) Creat: : 10.43 (08/18/2009)   Chol: 186 (08/21/2009)   HDL: 27 (08/21/2009)   LDL: 84 (08/21/2009)   TG: 373 (08/21/2009)  Problem # 4:  OBESITY (ICD-278.00) Assessment: Unchanged  Ht: 74 (01/21/2010)   Wt: 284 (01/21/2010)   BMI: 36.60 (01/21/2010)  Complete Medication List: 1)  Omeprazole 20 Mg Cpdr (Omeprazole) .Marland Kitchen.. 1 by mouth once daily 2)  Baby Aspirin 81 Mg Chew (Aspirin) .Marland Kitchen.. 1 by mouth once daily 3)  Sensipar 30 Mg Tabs (Cinacalcet hcl) .Marland Kitchen.. 1 by mouth once daily 4)  Senna Concentrate 8.6 Mg Tabs (Sennosides) .Marland Kitchen.. 1 by mouth two times a day 5)  Gabapentin 300 Mg Caps (Gabapentin) .Marland Kitchen.. 1 by mouth two times a day 6)  Nephro-vite Rx 1 Mg Tabs (B complex-c-folic acid) .Marland Kitchen.. 1 by mouth once daily 7)  Glipizide 10 Mg Tabs (Glipizide) .... Take 1 tab bid 8)  Allopurinol 100 Mg Tabs (Allopurinol) .Marland Kitchen.. 1 by mouth once daily 9)   Sea-omega 300 Mg Caps (Omega-3 fatty acids) .... 2 by mouth three times a day 10)  Lisinopril 40 Mg Tabs (Lisinopril) .... 1/2 tab two times a day 11)  Gemfibrozil 600 Mg Tabs (Gemfibrozil) .... Take 1 tab two times a day 12)  Trental 400 Mg Cr-tabs (Pentoxifylline) .... Take 1 tab three times a day 13)  Renvela 800 Mg Tabs (Sevelamer carbonate) .... Take 5 tabs three times a day before meals 14)  Zyrtec Allergy 10 Mg Tabs (Cetirizine hcl) .... One tab by mouth qd 15)  Nephro-vite 0.8 Mg Tabs (B complex-c-folic acid) .... One tab by mouth qd 16)  Lancets and Strips For Precison Meter  .... Two times a day testing 17)  Tramadol Hcl 50 Mg Tabs (Tramadol hcl) .... One tablet three times daily as needed 18)  Pacerone 200 Mg Tabs (Amiodarone hcl) .... Take 1 tablet by mouth once a day 19)  Doxycycline Hyclate 100 Mg Caps (Doxycycline hyclate) .... Take 1 capsule by mouth two times a day  Patient Instructions: 1)  F/u 2 nd week inn March 2)  cXR 2nd week in march. 3)  no med changes at this time doxycyline is prescribed for an additional 2 weeks for your sinus infection Prescriptions: DOXYCYCLINE HYCLATE 100 MG CAPS (DOXYCYCLINE HYCLATE) Take 1 capsule by mouth two times a day  #28 x 0   Entered and Authorized by:   Syliva Overman MD   Signed by:   Syliva Overman MD on 01/21/2010   Method used:   Electronically to        [facility] (retail)       [address]       [place], [place]  [address]       Ph: [phone]       Fax: [phone]   RxID:   [id number]   Laboratory Results   Blood Tests   Date/Time Received: January 21, 2010  Date/Time Reported: January 21, 2010   Glucose (random): 358 mg/dL   (Normal Range: 43-329) HGBA1C: 7.9%   (Normal Range: Non-Diabetic - 3-6%   Control Diabetic - 6-8%)      Medication Administration  Injection # 1:    Medication: Insulin 5 units    Diagnosis: DIABETES MELLITUS, TYPE II (ICD-250.00)    Route:  SQ    Site: R deltoid    Exp Date: 3/12    Lot #: [id number]    Mfr: novo nordisk    Comments: 5 units given    Patient tolerated injection without complications    Given by: Worthy Keeler LPN (January 21, 2010 5:00 PM)  Orders Added: 1)  Glucose, (CBG) [82962] 2)  Hemoglobin A1C [83036] 3)  Est. Patient Level IV [60630] 4)  Insulin 5 units [J1815] 5)  Admin of Therapeutic Inj  intramuscular or subcutaneous [16010]

## 2011-01-19 NOTE — Assessment & Plan Note (Signed)
Summary: office visit   Vital Signs:  Patient profile:   60 year old male Height:      74 inches Weight:      285.75 pounds BMI:     36.82 O2 Sat:      96 % on Room air Pulse rate:   86 / minute Pulse rhythm:   regular Resp:     16 per minute BP sitting:   124 / 60  (right arm) Cuff size:   large  Vitals Entered By: Everitt Amber LPN (June 11, 2010 9:45 AM)  Nutrition Counseling: Patient's BMI is greater than 25 and therefore counseled on weight management options.  O2 Flow:  Room air CC: Follow up chronic problems   Primary Care Provider:  Syliva Overman MD  CC:  Follow up chronic problems.  History of Present Illness: Wants closer endo specialist,he is aware that his blood sugars are uncontrolled, and that he needs the help of a specialoist.  Wants to stop oxygen, alreadty on CPAP   painful swellin no blood with straining at stool x 1 week he denies any rrecent fever or chills. He denies head or chest congestion. He denies depression or anxiety. He denies skin breakdown.  Current Medications (verified): 1)  Omeprazole 20 Mg  Cpdr (Omeprazole) .Marland Kitchen.. 1 By Mouth Once Daily 2)  Baby Aspirin 81 Mg  Chew (Aspirin) .Marland Kitchen.. 1 By Mouth Once Daily 3)  Sensipar 30 Mg  Tabs (Cinacalcet Hcl) .Marland Kitchen.. 1 By Mouth Once Daily 4)  Senna Concentrate 8.6 Mg  Tabs (Sennosides) .Marland Kitchen.. 1 By Mouth Two Times A Day 5)  Gabapentin 300 Mg  Caps (Gabapentin) .Marland Kitchen.. 1 By Mouth Two Times A Day 6)  Nephro-Vite Rx 1 Mg  Tabs (B Complex-C-Folic Acid) .Marland Kitchen.. 1 By Mouth Once Daily 7)  Glipizide 10 Mg Tabs (Glipizide) .... Take 1 Tab Bid 8)  Allopurinol 100 Mg  Tabs (Allopurinol) .Marland Kitchen.. 1 By Mouth Once Daily 9)  Sea-Omega 300 Mg  Caps (Omega-3 Fatty Acids) .... 2 By Mouth Three Times A Day 10)  Gemfibrozil 600 Mg Tabs (Gemfibrozil) .... Take 1 Tab Two Times A Day 11)  Trental 400 Mg Cr-Tabs (Pentoxifylline) .... Take 1 Tab Three Times A Day 12)  Renvela 800 Mg Tabs (Sevelamer Carbonate) .... Take 5 Tabs Three Times  A Day Before Meals 13)  Zyrtec Allergy 10 Mg Tabs (Cetirizine Hcl) .... One Tab By Mouth Qd 14)  Nephro-Vite 0.8 Mg Tabs (B Complex-C-Folic Acid) .... One Tab By Mouth Qd 15)  Lancets and Strips For Precison Meter .... Two Times A Day Testing 16)  Pacerone 200 Mg Tabs (Amiodarone Hcl) .... Take 1 Tablet By Mouth Once A Day 17)  Tramadol Hcl 50 Mg Tabs (Tramadol Hcl) .... Take 1 Every 12 Hours As Needed For Pain. 18)  Losartan Potassium 100 Mg Tabs (Losartan Potassium) .... Take 1 Tablet By Mouth Once A Day  Allergies (verified): 1)  Pcn  Review of Systems      See HPI General:  Complains of fatigue; denies chills and fever. ENT:  Complains of nasal congestion and postnasal drainage; post nasal drainage. Resp:  Complains of cough and sputum productive; denies shortness of breath and wheezing; coughing up white sputum for a long time no fever or chills. GI:  Complains of constipation; mil;d constipation with straining at stool aggravating hemmorhoids. Endo:  Complains of excessive thirst and excessive urination; reports continued fluctuations in blood sugars with poor control. Heme:  Denies abnormal bruising and bleeding.  Allergy:  Denies hives or rash and itching eyes.  Physical Exam  General:  Well-developed,obesein no acute distress; alert,appropriate and cooperative throughout examination HEENT: No facial asymmetry,  EOMI, No sinus tenderness, TM's Clear, oropharynx  pink and moist.   Chest: Clear to auscultation bilaterally.  CVS: S1, S2, No murmurs, No S3.   Abd: Soft, obese, Nontender.  ZO:[id number]  ROM spine, hips, shoulders and knees.  Ext: No edema.   CNS: CN 2-12 intact, power tone and sensation normal throughout.   Skin: Intact, no visible lesions or rashes.  Psych: Good eye contact, normal affect.  Memory intact, not anxious or depressed appearing.    Impression & Recommendations:  Problem # 1:  FATIGUE (ICD-780.79) Assessment Deteriorated  Orders: T-CBC w/Diff  (98119-14782)  Problem # 2:  HEMORRHOIDS (ICD-455.6) Assessment: Deteriorated proctocort and styool softeners recommended  Problem # 3:  DIABETES MELLITUS, TYPE II (ICD-250.00) Assessment: Unchanged  The following medications were removed from the medication list:    Lisinopril 40 Mg Tabs (Lisinopril) .Marland Kitchen... 1/2 tab two times a day His updated medication list for this problem includes:    Baby Aspirin 81 Mg Chew (Aspirin) .Marland Kitchen... 1 by mouth once daily    Glipizide 10 Mg Tabs (Glipizide) .Marland Kitchen... Take 1 tab bid    Losartan Potassium 100 Mg Tabs (Losartan potassium) .Marland Kitchen... Take 1 tablet by mouth once a day  Orders: T- Hemoglobin A1C (95621-30865), pt has uncontrolled blood sugar with epiusodes of life threATENING HYPOGLYCEMOIA REQUIRING HOSPITALISATION, HE WISHE TO LOCATE an endocrinologist nearer home, this will be facilitated.  Endocrinology Referral (Endocrine)  Problem # 4:  HYPERTENSION (ICD-401.9) Assessment: Unchanged  The following medications were removed from the medication list:    Lisinopril 40 Mg Tabs (Lisinopril) .Marland Kitchen... 1/2 tab two times a day His updated medication list for this problem includes:    Losartan Potassium 100 Mg Tabs (Losartan potassium) .Marland Kitchen... Take 1 tablet by mouth once a day  Orders: T-Basic Metabolic Panel (78469-62952)  BP today: 124/60 Prior BP: 132/58 (05/05/2010)  Labs Reviewed: K+: 3.9 (08/18/2009) Creat: : 10.43 (08/18/2009)   Chol: 186 (08/21/2009)   HDL: 27 (08/21/2009)   LDL: 84 (08/21/2009)   TG: 373 (08/21/2009)  Problem # 5:  ESRD (ICD-585.6) Assessment: Comment Only on a regular dialysis schedule  Complete Medication List: 1)  Omeprazole 20 Mg Cpdr (Omeprazole) .Marland Kitchen.. 1 by mouth once daily 2)  Baby Aspirin 81 Mg Chew (Aspirin) .Marland Kitchen.. 1 by mouth once daily 3)  Sensipar 30 Mg Tabs (Cinacalcet hcl) .Marland Kitchen.. 1 by mouth once daily 4)  Senna Concentrate 8.6 Mg Tabs (Sennosides) .Marland Kitchen.. 1 by mouth two times a day 5)  Gabapentin 300 Mg Caps (Gabapentin)  .Marland Kitchen.. 1 by mouth two times a day 6)  Nephro-vite Rx 1 Mg Tabs (B complex-c-folic acid) .Marland Kitchen.. 1 by mouth once daily 7)  Glipizide 10 Mg Tabs (Glipizide) .... Take 1 tab bid 8)  Allopurinol 100 Mg Tabs (Allopurinol) .Marland Kitchen.. 1 by mouth once daily 9)  Sea-omega 300 Mg Caps (Omega-3 fatty acids) .... 2 by mouth three times a day 10)  Gemfibrozil 600 Mg Tabs (Gemfibrozil) .... Take 1 tab two times a day 11)  Trental 400 Mg Cr-tabs (Pentoxifylline) .... Take 1 tab three times a day 12)  Renvela 800 Mg Tabs (Sevelamer carbonate) .... Take 5 tabs three times a day before meals 13)  Zyrtec Allergy 10 Mg Tabs (Cetirizine hcl) .... One tab by mouth qd 14)  Nephro-vite 0.8 Mg Tabs (B complex-c-folic acid) .Marland KitchenMarland KitchenMarland Kitchen  One tab by mouth qd 15)  Lancets and Strips For Precison Meter  .... Two times a day testing 16)  Pacerone 200 Mg Tabs (Amiodarone hcl) .... Take 1 tablet by mouth once a day 17)  Tramadol Hcl 50 Mg Tabs (Tramadol hcl) .... Take 1 every 12 hours as needed for pain. 18)  Losartan Potassium 100 Mg Tabs (Losartan potassium) .... Take 1 tablet by mouth once a day 19)  Proctocort 30 Mg Supp (Hydrocortisone acetate) .... Insert one suppositiry 3 times daily as for 1 week,then as needed 20)  Flonase 50 Mcg/act Susp (Fluticasone propionate) .... One puff per nosrtril daily for allergies  Other Orders: T-Hepatic Function [id number]) T-Lipid Profile [phone])  Patient Instructions: 1)  Please schedule a follow-up appointment in 3.58months. 2)  You will be referred to an endocrinologist in [place]. 3)  BMP prior to visit, ICD-9: 4)  Hepatic Panel prior to visit, ICD-9: 5)  Lipid Panel prior to visit, ICD-9:   today, fasting 6)  CBC w/ Diff prior to visit, ICD-9: 7)  HbgA1C prior to visit, ICD-9: 8)  It is impt to use stool softener every day Prescriptions: FLONASE 50 MCG/ACT SUSP (FLUTICASONE PROPIONATE) one puff per nosrtril daily for allergies  #1 x 3   Entered and Authorized by:   Syliva Overman MD   Signed by:   Syliva Overman MD on 06/11/2010   Method used:   Electronically to        [facility] (retail)       [address]       [place], [place]  [address]       Ph: [phone]       Fax: [phone]   RxID:   [id number] PROCTOCORT 30 MG SUPP (HYDROCORTISONE ACETATE) insert one suppositiry 3 times daily as for 1 week,then as needed  #20 x 1   Entered and Authorized by:   Syliva Overman MD   Signed by:   Syliva Overman MD on 06/11/2010   Method used:   Electronically to        [facility] (retail)       [address]       [place], [place]  [address]       Ph: [phone]       Fax: [phone]   RxID:   [id number]

## 2011-01-19 NOTE — Progress Notes (Signed)
  Phone Note From Other Clinic   Caller: Nurse Call For: lipids and tsh Summary of Call: pt is unable to have labs drawn at dialysis until December for lipid profile and tsh, cmp will be faxed to our office on Monday.  Pt states he is unable to have his blood drawn by anyone else but his diallysis center. Initial call taken by: Teressa Lower RN,  September 25, 2010 4:50 PM

## 2011-01-19 NOTE — Progress Notes (Signed)
Summary: in pain  Phone Note Call from Patient   Summary of Call: just left dr. Lottie Rater and said it was not his back it is his kidneys and needs something for pain [facility] [place] and does he need to be seen somewhere else the pain is terrilble effecting his whole body call back at [phone] Initial call taken by: Lind Guest,  October 19, 2010 12:14 PM  Follow-up for Phone Call        pls advise pt to inc tramadol 50mg  to one tab 3 times daily, and pls fax in with new rx written historically, his opharmacy. He needs to get the shoulder taken care of that is where the problem is Follow-up by: Syliva Overman MD,  October 20, 2010 4:17 AM  Additional Follow-up for Phone Call Additional follow up Details #1::        called patient, no answer Additional Follow-up by: Adella Hare LPN,  October 20, 2010 9:00 AM    Additional Follow-up for Phone Call Additional follow up Details #2::    called patient, no answer Follow-up by: Adella Hare LPN,  October 22, 2010 10:30 AM  New/Updated Medications: TRAMADOL HCL 50 MG TABS (TRAMADOL HCL) one tablet 3 times daily Prescriptions: TRAMADOL HCL 50 MG TABS (TRAMADOL HCL) one tablet 3 times daily  #90 x 0   Entered and Authorized by:   Syliva Overman MD   Signed by:   Syliva Overman MD on 10/20/2010   Method used:   Historical   RxID:   [id number]

## 2011-01-19 NOTE — Letter (Signed)
Summary: [facility]   Imported By: Lind Guest 11/03/2010 10:04:01  _____________________________________________________________________  External Attachment:    Type:   Image     Comment:   External Document

## 2011-01-19 NOTE — Miscellaneous (Signed)
Summary: LABS LIPIDS,LIVER,TSH 08/20/2009  Clinical Lists Changes  Observations: Added new observation of ALBUMIN: 4.0 g/dL (16/09/9603 54:09) Added new observation of PROTEIN, TOT: 6.2 g/dL (81/19/1478 29:56) Added new observation of SGPT (ALT): 18 units/L (08/20/2009 11:35) Added new observation of SGOT (AST): 20 units/L (08/20/2009 11:35) Added new observation of ALK PHOS: 82 units/L (08/20/2009 11:35) Added new observation of BILI DIRECT: 0.1 mg/dL (21/30/8657 84:69) Added new observation of LDL: 84 mg/dL (62/95/2841 32:44) Added new observation of HDL: 27 mg/dL (12/22/7251 66:44) Added new observation of TRIGLYC TOT: 373 mg/dL (03/47/4259 56:38) Added new observation of CHOLESTEROL: 186 mg/dL (75/64/3329 51:88) Added new observation of TSH: 2.439 microintl units/mL (08/20/2009 11:35)

## 2011-01-19 NOTE — Medication Information (Signed)
Summary: [employer]   Imported By: Lind Guest 01/29/2010 13:42:10  _____________________________________________________________________  External Attachment:    Type:   Image     Comment:   External Document

## 2011-01-19 NOTE — Miscellaneous (Signed)
Summary: CHEST XRAY 01/08/2010,ECHO 01/09/2010  Clinical Lists Changes  Observations: Added new observation of ECHOINTERP:  Study Conclusions    - Left ventricle: The cavity size was normal. Wall thickness was     normal. Systolic function was normal. The estimated ejection     fraction was in the range of 55% to 65%.   - Mitral valve: Calcified annulus.   - Right ventricle: The cavity size was mildly dilated.   - Right atrium: The atrium was mildly dilated.   Transthoracic echocardiography. M-mode, complete 2D, spectral   Doppler, and color Doppler. Height: Height: 188cm. Height: 74in.   Weight: Weight: 130kg. Weight: 286lb. Body mass index: BMI:   36.8kg/m^2. Body surface area: BSA: 2.22m^2. Blood pressure: 91/31.   Patient status: Inpatient. Location: ICU/CCU    --------------------------------------------------------------------  (01/09/2010 17:32) Added new observation of CXR RESULTS:  Clinical Data: Sepsis.  Pulmonary edema.    PORTABLE CHEST - 1 VIEW    Comparison: 01/07/2010.    Findings: Central venous catheter tip is in the superior SVC,   unchanged.  There is no pneumothorax.    The cardiac enlargement with mild vascular congestion, unchanged.   No significant edema or effusion.    Right lower lobe airspace disease is unchanged and  may be   atelectasis or infiltrate.    IMPRESSION:   Cardiac enlargement and vascular congestion are unchanged.    Right lower lobe airspace disease is unchanged.    Read By:  Camelia Phenes,  M.D.   Released By:  Camelia Phenes,  M.D.  (01/08/2010 17:31)      CXR  Procedure date:  01/08/2010  Findings:       Clinical Data: Sepsis.  Pulmonary edema.    PORTABLE CHEST - 1 VIEW    Comparison: 01/07/2010.    Findings: Central venous catheter tip is in the superior SVC,   unchanged.  There is no pneumothorax.    The cardiac enlargement with mild vascular congestion, unchanged.   No significant edema or effusion.   Right lower lobe airspace disease is unchanged and  may be   atelectasis or infiltrate.    IMPRESSION:   Cardiac enlargement and vascular congestion are unchanged.    Right lower lobe airspace disease is unchanged.    Read By:  Camelia Phenes,  M.D.   Released By:  Camelia Phenes,  M.D.   Echocardiogram  Procedure date:  01/09/2010  Findings:       Study Conclusions    - Left ventricle: The cavity size was normal. Wall thickness was     normal. Systolic function was normal. The estimated ejection     fraction was in the range of 55% to 65%.   - Mitral valve: Calcified annulus.   - Right ventricle: The cavity size was mildly dilated.   - Right atrium: The atrium was mildly dilated.   Transthoracic echocardiography. M-mode, complete 2D, spectral   Doppler, and color Doppler. Height: Height: 188cm. Height: 74in.   Weight: Weight: 130kg. Weight: 286lb. Body mass index: BMI:   36.8kg/m^2. Body surface area: BSA: 2.42m^2. Blood pressure: 91/31.   Patient status: Inpatient. Location: ICU/CCU    --------------------------------------------------------------------

## 2011-01-19 NOTE — Assessment & Plan Note (Signed)
Summary: PAIN   Vital Signs:  Patient profile:   60 year old male Height:      74 inches Weight:      278.75 pounds BMI:     35.92 O2 Sat:      95 % on Room air Pulse rate:   87 / minute Pulse rhythm:   regular Resp:     16 per minute BP sitting:   120 / 58  (left arm)  Vitals Entered By: Adella Hare LPN (September 24, 2010 1:33 PM)  Nutrition Counseling: Patient's BMI is greater than 25 and therefore counseled on weight management options.  O2 Flow:  Room air CC: back and shoulder pain Is Patient Diabetic? Yes Did you bring your meter with you today? No CBG Result 87 Comments DID NOT BRING MEDICATIONS TO OV   Primary Care Provider:  Syliva Overman MD  CC:  back and shoulder pain.  History of Present Illness: 2 week h/o uncontrolled low back pai radiating to right lower abdomen, states he is getting hydrocodone from the [place] this is not helping he is requesting referral to a specialist for further eval and mx states he has disc disease. States he was told by the nephrologist that the shoulder and back pain may be related, Baesed on history and exam I think he needs imaging of the shoulder. he is tolerating dialysis well. His nlood sugar cntrol is improved, and he sees endo in [place]  Allergies (verified): 1)  Pcn  Review of Systems      See HPI General:  Complains of fatigue. Eyes:  Denies discharge and eye pain. ENT:  Denies earache, hoarseness, nasal congestion, and sinus pressure. CV:  Denies chest pain or discomfort, palpitations, and swelling of feet. Resp:  Denies cough and sputum productive. GI:  Denies abdominal pain, constipation, diarrhea, nausea, and vomiting. GU:  dialysis. MS:  Complains of joint pain, low back pain, mid back pain, and stiffness. Derm:  Denies itching and rash. Psych:  Denies anxiety and depression. Endo:  Denies excessive thirst and excessive urination. Heme:  Denies abnormal bruising and bleeding. Allergy:  Denies hives or rash  and itching eyes.  Physical Exam  General:  Well-developed,obesein no acute distress; alert,appropriate and cooperative throughout examination HEENT: No facial asymmetry,  EOMI, No sinus tenderness, TM's Clear, oropharynx  pink and moist.   Chest: Clear to auscultation bilaterally.  CVS: S1, S2, No murmurs, No S3.   Abd: Soft, obese, Nontender.  ZO:[id number]  ROM spine, and left shoulders   Ext: No edema.   CNS: CN 2-12 intact, power tone and sensation normal throughout.   Skin: Intact, no visible lesions or rashes.  Psych: Good eye contact, normal affect.  Memory intact, not anxious or depressed appearing.    Impression & Recommendations:  Problem # 1:  SHOULDER PAIN, LEFT (ICD-719.41) Assessment Deteriorated  His updated medication list for this problem includes:    Baby Aspirin 81 Mg Chew (Aspirin) .Marland Kitchen... 1 by mouth once daily    Tramadol Hcl 50 Mg Tabs (Tramadol hcl) .Marland Kitchen... Take 1 every 12 hours as needed for pain.  Future Orders: Radiology Referral (Radiology) ... 09/25/2010  Problem # 2:  ATRIAL FIBRILLATION, PAROXYSMAL (ICD-427.31) Assessment: Comment Only  His updated medication list for this problem includes:    Baby Aspirin 81 Mg Chew (Aspirin) .Marland Kitchen... 1 by mouth once daily    Pacerone 200 Mg Tabs (Amiodarone hcl) .Marland Kitchen... Take 1 tablet by mouth once a day rate control is excellent  Problem # 3:  HYPERLIPIDEMIA (ICD-272.4) Assessment: Comment Only  His updated medication list for this problem includes:    Gemfibrozil 600 Mg Tabs (Gemfibrozil) .Marland Kitchen... Take 1 tab two times a day [facility] followed Low fat dietdiscussed and encouraged  Labs Reviewed: SGOT: 14 (08/26/2010)   SGPT: 15 (08/26/2010)   HDL:27 (08/21/2009), 27 (08/21/2009)  LDL:154 (08/26/2010), 84 (98/10/9146)  Chol:186 (08/21/2009), 186 (08/21/2009)  Trig:373 (08/21/2009), 373 (08/21/2009)  Problem # 4:  HYPERTENSION (ICD-401.9) Assessment: Unchanged  His updated  medication list for this problem includes:    Losartan Potassium 100 Mg Tabs (Losartan potassium) .Marland Kitchen... Take 1 tablet by mouth once a day  BP today: 120/58 Prior BP: 124/60 (06/11/2010)  Labs Reviewed: K+: 4.3 (08/26/2010) Creat: : 10.52 (08/26/2010)   Chol: 186 (08/21/2009)   HDL: 27 (08/21/2009)   LDL: 154 (08/26/2010)   TG: 373 (08/21/2009)  Problem # 5:  OBESITY (ICD-278.00) Assessment: Unchanged  Ht: 74 (09/24/2010)   Wt: 278.75 (09/24/2010)   BMI: 35.92 (09/24/2010)  Complete Medication List: 1)  Omeprazole 20 Mg Cpdr (Omeprazole) .Marland Kitchen.. 1 by mouth once daily 2)  Baby Aspirin 81 Mg Chew (Aspirin) .Marland Kitchen.. 1 by mouth once daily 3)  Sensipar 30 Mg Tabs (Cinacalcet hcl) .Marland Kitchen.. 1 by mouth once daily 4)  Senna Concentrate 8.6 Mg Tabs (Sennosides) .Marland Kitchen.. 1 by mouth two times a day 5)  Gabapentin 300 Mg Caps (Gabapentin) .Marland Kitchen.. 1 by mouth two times a day 6)  Nephro-vite Rx 1 Mg Tabs (B complex-c-folic acid) .Marland Kitchen.. 1 by mouth once daily 7)  Glipizide 10 Mg Tabs (Glipizide) .... Take 1 tab bid 8)  Allopurinol 100 Mg Tabs (Allopurinol) .Marland Kitchen.. 1 by mouth once daily 9)  Sea-omega 300 Mg Caps (Omega-3 fatty acids) .... 2 by mouth three times a day 10)  Gemfibrozil 600 Mg Tabs (Gemfibrozil) .... Take 1 tab two times a day 11)  Trental 400 Mg Cr-tabs (Pentoxifylline) .... Take 1 tab three times a day 12)  Renvela 800 Mg Tabs (Sevelamer carbonate) .... Take 5 tabs three times a day before meals 13)  Zyrtec Allergy 10 Mg Tabs (Cetirizine hcl) .... One tab by mouth qd 14)  Nephro-vite 0.8 Mg Tabs (B complex-c-folic acid) .... One tab by mouth qd 15)  Lancets and Strips For Precison Meter  .... Two times a day testing 16)  Pacerone 200 Mg Tabs (Amiodarone hcl) .... Take 1 tablet by mouth once a day 17)  Tramadol Hcl 50 Mg Tabs (Tramadol hcl) .... Take 1 every 12 hours as needed for pain. 18)  Losartan Potassium 100 Mg Tabs (Losartan potassium) .... Take 1 tablet by mouth once a day 19)  Proctocort 30 Mg Supp  (Hydrocortisone acetate) .... Insert one suppositiry 3 times daily as for 1 week,then as needed 20)  Flonase 50 Mcg/act Susp (Fluticasone propionate) .... One puff per nosrtril daily for allergies  Other Orders: Glucose, (CBG) ([id number]) Neurosurgeon Referral (Neurosurgeon)  Patient Instructions: 1)  Please schedule a follow-up appointment in 4 months. 2)  It is important that you exercise regularly at least 20 minutes 5 times a week. If you develop chest pain, have severe difficulty breathing, or feel very tired , stop exercising immediately and seek medical attention. 3)  You need to lose weight. Consider a lower calorie diet and regular exercise.  4)  You will be referred for an MRI of the left shoulder. 5)  We will refer you to dr Jeral Fruit for further eval  of back pain  Laboratory Results   Blood Tests   Date/Time Received: September 24, 2010 3:22 PM  Date/Time Reported: September 24, 2010 3:22 PM   CBG Random:: 87  Comments: first check 54 gave some yogurt and sweet tea last check [address]. Adella Hare LPN  September 24, 2010 3:22 PM

## 2011-01-19 NOTE — Letter (Signed)
Summary: [facility] / [facility]   [facility] / [facility]   Imported By: Lind Guest 10/16/2010 13:59:56  _____________________________________________________________________  External Attachment:    Type:   Image     Comment:   External Document

## 2011-01-19 NOTE — Letter (Signed)
Summary: medical releases  medical releases   Imported By: Lind Guest 09/30/2010 11:55:50  _____________________________________________________________________  External Attachment:    Type:   Image     Comment:   External Document

## 2011-01-19 NOTE — Letter (Signed)
Summary: [facility]/ [facility]  [facility]/ [facility]   Imported By: Lind Guest 11/06/2010 10:38:31  _____________________________________________________________________  External Attachment:    Type:   Image     Comment:   External Document

## 2011-01-19 NOTE — Letter (Signed)
Summary: Appointment - Missed   [facility]  [address], [place] [address]   Phone: [phone]  Fax: [phone]     July 15, 2010 MRN: [id number]   KAILAND SEDA [address] [place], [place]  [address]   Dear Mr. STASH,  Our records indicate you missed your appointment on       07/15/10                 with Dr.       .   ROTHBART                                 It is very important that we reach you to reschedule this appointment. We look forward to participating in your health care needs. Please contact us at the number listed above at your earliest convenience to reschedule this appointment.     Sincerely,    Glass blower/designer

## 2011-01-19 NOTE — Progress Notes (Signed)
Summary: calls to patient for referral appt,wishes to hold  Phone Note Outgoing Call   Call placed to: Patient Summary of Call: Calls made to patient re: referral appointment per Dr Lodema Hong for back pain (5/27 - 05/26/10). Patient states he did not wish to schedule at this time, and would call back when ready to schedule.   Initial call taken by: Cammie Sickle,  July 07, 2010 6:51 PM  Follow-up for Phone Call        noted, thanks Follow-up by: Syliva Overman MD,  July 07, 2010 7:53 PM

## 2011-01-19 NOTE — Assessment & Plan Note (Signed)
Summary: right flank pain -room 3   Vital Signs:  Patient profile:   60 year old male Height:      74 inches Weight:      284 pounds BMI:     36.60 O2 Sat:      95 % on Room air Pulse rate:   81 / minute Resp:     16 per minute BP sitting:   132 / 58  (left arm)  Vitals Entered By: Adella Hare LPN (May 05, 2010 11:29 AM) CC: right flank pain Is Patient Diabetic? No Pain Assessment Patient in pain? yes     Location: right flank Intensity: 6 Type: aching Onset of pain  Constant Comments did not bring meds to ov   Primary Provider:  Syliva Overman MD  CC:  right flank pain.  History of Present Illness: Pt is here today with c/o Rt flank/back pain x 1 week.  No prev hx of.  He denies any injury, heavy lifing, etc. Hurts to move certain way, or lie on Rt side.  Pain comes around to rt side of abd.  No radiation to LE.  No numbness or tingling of LE.  Pt states Tylenol provides little relief.  Hx of ESRD.  Pt states he hasnt urinated since he began dialysis few yrs ago.  Also has a hx of renal cysts.  No fever or chills.  No change of BMs.  No cough or chest congestion.  Was inpt for pneumonia a couple mos ago but syptoms resolved.   Allergies (verified): 1)  Pcn  Past History:  Past medical history reviewed for relevance to current acute and chronic problems.  Past Medical History: Reviewed history from 09/24/2009 and no changes required. Obesity; sleep apnea; treatment with BiPAP Anemia-NOS Diabetes mellitus, type II-no insulin  since 1993 GERD ESRD-on dialysis  since 2007 hyperparathyroidism, secondary Allergic rhinitis Hyperlipidemia since 2008 Hypertension   since 1993 Evaluation for possible coronary disease-normal coronary angiography in 2/09 Lower gastrointestinal bleed in 5/09: Diverticular disease and hemorrhoids identified constipation Hospitalised for 4 days at [facility] in 08/2009 wit bronchitis, presented with chest pain [facility] Chronic back pain, established in [place] that he has  disc disease.  Review of Systems General:  Denies chills and fever. CV:  Denies chest pain or discomfort. Resp:  Denies cough. GI:  Denies change in bowel habits, nausea, and vomiting. GU:  Denies dysuria and hematuria.  Physical Exam  General:  Well-developed,well-nourished,in no acute distress; alert,appropriate and cooperative throughout examination Head:  Normocephalic and atraumatic without obvious abnormalities. No apparent alopecia or balding. Ears:  External ear exam shows no significant lesions or deformities.  Otoscopic examination reveals clear canals, tympanic membranes are intact bilaterally without bulging, retraction, inflammation or discharge. Hearing is grossly normal bilaterally. Nose:  External nasal examination shows no deformity or inflammation. Nasal mucosa are pink and moist without lesions or exudates. Mouth:  Oral mucosa and oropharynx without lesions or exudates.  Teeth in good repair. Neck:  No deformities, masses, or tenderness noted. Lungs:  Normal respiratory effort, chest expands symmetrically. Lungs are clear to auscultation, no crackles or wheezes. Heart:  Normal rate and regular rhythm. S1 and S2 normal without gallop, murmur, click, rub or other extra sounds. Abdomen:  Obese, rounded abd.  No mass though exam difficult due to body habitus.  Pt does report tenderness with palp Rt mid abd.  Seems to be more painful when I tried to specifically feel for  his Rt kidney.  No guarding, rebound or rigidity. Msk:  LS Spine:  TTP Rt L2-L4 paraspinal muscles, with spasm noted.  Pain reproduced in this area with pt mvmt & change of positions. Neurologic:  alert & oriented X3 and gait normal.  Bilat patellar DTR +1/4 Cervical Nodes:  No lymphadenopathy noted Psych:  normally interactive, good eye contact, and not anxious appearing.     Impression & Recommendations:  Problem # 1:  FLANK  PAIN, RIGHT (ICD-789.09) Assessment New Discussed with pt that his pain seems to be musculoskeletal, but that because of his abd tenderness will evaluate his kidneys. Recommend pt use heat to the area of his back.  His updated medication list for this problem includes:    Baby Aspirin 81 Mg Chew (Aspirin) .Marland Kitchen... 1 by mouth once daily    Tramadol Hcl 50 Mg Tabs (Tramadol hcl) ..... One tablet three times daily as needed    Tramadol Hcl 50 Mg Tabs (Tramadol hcl) .Marland Kitchen... Take 1 every 12 hours as needed for pain.  Orders: CT Abdomen/Pelvis Without Contrast (CT A/P w/o)  Problem # 2:  ESRD (ICD-585.6) Assessment: Comment Only  Complete Medication List: 1)  Omeprazole 20 Mg Cpdr (Omeprazole) .Marland Kitchen.. 1 by mouth once daily 2)  Baby Aspirin 81 Mg Chew (Aspirin) .Marland Kitchen.. 1 by mouth once daily 3)  Sensipar 30 Mg Tabs (Cinacalcet hcl) .Marland Kitchen.. 1 by mouth once daily 4)  Senna Concentrate 8.6 Mg Tabs (Sennosides) .Marland Kitchen.. 1 by mouth two times a day 5)  Gabapentin 300 Mg Caps (Gabapentin) .Marland Kitchen.. 1 by mouth two times a day 6)  Nephro-vite Rx 1 Mg Tabs (B complex-c-folic acid) .Marland Kitchen.. 1 by mouth once daily 7)  Glipizide 10 Mg Tabs (Glipizide) .... Take 1 tab bid 8)  Allopurinol 100 Mg Tabs (Allopurinol) .Marland Kitchen.. 1 by mouth once daily 9)  Sea-omega 300 Mg Caps (Omega-3 fatty acids) .... 2 by mouth three times a day 10)  Lisinopril 40 Mg Tabs (Lisinopril) .... 1/2 tab two times a day 11)  Gemfibrozil 600 Mg Tabs (Gemfibrozil) .... Take 1 tab two times a day 12)  Trental 400 Mg Cr-tabs (Pentoxifylline) .... Take 1 tab three times a day 13)  Renvela 800 Mg Tabs (Sevelamer carbonate) .... Take 5 tabs three times a day before meals 14)  Zyrtec Allergy 10 Mg Tabs (Cetirizine hcl) .... One tab by mouth qd 15)  Nephro-vite 0.8 Mg Tabs (B complex-c-folic acid) .... One tab by mouth qd 16)  Lancets and Strips For Precison Meter  .... Two times a day testing 17)  Tramadol Hcl 50 Mg Tabs (Tramadol hcl) .... One tablet three times daily as  needed 18)  Pacerone 200 Mg Tabs (Amiodarone hcl) .... Take 1 tablet by mouth once a day 19)  Doxycycline Hyclate 100 Mg Caps (Doxycycline hyclate) .... Take 1 capsule by mouth two times a day 20)  Tramadol Hcl 50 Mg Tabs (Tramadol hcl) .... Take 1 every 12 hours as needed for pain.  Patient Instructions: 1)  Please schedule a follow-up appointment in 1 month with Dr Lodema Hong. 2)  I have ordered a cat scan of your kidneys. 3)  I have prescribed Tramadol for pain.  You should not take more than 1 tablet every 12 hours, unless advised, due to your dialysis. Prescriptions: TRAMADOL HCL 50 MG TABS (TRAMADOL HCL) take 1 every 12 hours as needed for pain.  #20 x 0   Entered and Authorized by:   Esperanza Sheets PA   Signed by:  Esperanza Sheets PA on 05/05/2010   Method used:   Electronically to        [facility]  [address] (retail)       [address]       [place], [place]  [address]       Ph: [phone]       Fax: [phone]   RxID:   [id number]

## 2011-01-19 NOTE — Letter (Signed)
Summary: [facility] medical clearance  [facility] medical clearance   Imported By: Lind Guest 11/06/2010 14:51:49  _____________________________________________________________________  External Attachment:    Type:   Image     Comment:   External Document

## 2011-01-19 NOTE — Letter (Signed)
Summary: [facility]  [address], [place] [address]   Phone: [phone]  Fax: [phone]     September 17, 2010 MRN: [id number]   ARIF AMENDOLA [address] [place], [place]  [address]      YOUR LAB WORK IS DUE   September 21, 2010  Please go to [facility], located across the street from [facility] on the second floor.  Hours are Monday - Friday 7am until 7:30pm         Saturday 8am until 12noon    _X_  DO NOT EAT OR DRINK AFTER MIDNIGHT EVENING PRIOR TO LABWORK  __ YOUR LABWORK IS NOT FASTING --YOU MAY EAT PRIOR TO LABWORK

## 2011-01-19 NOTE — Letter (Signed)
Summary: [facility] [facility]  [address] [place] [address]   Phone: [phone]  Fax: [phone]     September 28, 2010 MRN: [id number]   Albert Lewis [address] [place], [place]  [address]      YOUR LAB WORK IS DUE   November 19, 2010  Please go to [facility], located across the street from [facility] on the second floor.  Hours are Monday - Friday 7am until 7:30pm         Saturday 8am until 12noon    _X_  DO NOT EAT OR DRINK AFTER MIDNIGHT EVENING PRIOR TO LABWORK  __ YOUR LABWORK IS NOT FASTING --YOU MAY EAT PRIOR TO LABWORK

## 2011-01-19 NOTE — Assessment & Plan Note (Signed)
Summary: OV   Vital Signs:  Patient profile:   60 year old male Height:      74 inches Weight:      273 pounds BMI:     35.18 O2 Sat:      94 % on Room air Pulse rate:   99 / minute Pulse rhythm:   regular Resp:     16 per minute BP sitting:   126 / 50  (right arm)  Vitals Entered By: Mauricia Area CMA (November 02, 2010 3:07 PM)  Nutrition Counseling: Patient's BMI is greater than 25 and therefore counseled on weight management options.  O2 Flow:  Room air CC: Lower back pain on right side   Primary Care Provider:  Syliva Overman MD  CC:  Lower back pain on right side.  History of Present Illness: Pt has upcoming right shoulder surgery, but has to wait on new shunt placement , since currently in his right arm. Pt denies any current chest pain, or palpitations and was in to see card for a fib in October. C/o uncontrolled back pain and hip pain, tender to palpation limiting sleep and mobility. pt seeing endo, he has less fluctuation in his bloodsugars and reports improvedcontrol. he denies depression or anxiety. he denies head or chest congestion or any recent fever or chills.   Allergies (verified): 1)  Pcn  Review of Systems      See HPI General:  Complains of fatigue and sleep disorder. Eyes:  Denies discharge and red eye. ENT:  Denies hoarseness, nasal congestion, postnasal drainage, sinus pressure, and sore throat. CV:  Denies chest pain or discomfort, palpitations, and swelling of feet. Resp:  Denies cough and sputum productive. GI:  Denies abdominal pain, constipation, diarrhea, nausea, and vomiting. MS:  localised reproducible right hip tenderness x  2weeks, unable to lie flat, recent  renal scan done pt not informed of abnormality . Derm:  Denies itching and rash. Neuro:  Denies headaches and seizures. Psych:  Denies anxiety and depression. Endo:  Denies excessive thirst and excessive urination. Heme:  Denies abnormal bruising and bleeding. Allergy:   Denies hives or rash and itching eyes.  Physical Exam  General:  Well-developed,obese,in no acute distress; alert,appropriate and cooperative throughout examination HEENT: No facial asymmetry,  EOMI, No sinus tenderness, TM's Clear, oropharynx  pink and moist.   Chest: Clear to auscultation bilaterally.  CVS: S1, S2, No murmurs, No S3.   Abd: Soft, Nontender.  MS: Adequate ROM spine, hips, shoulders and knees. tender to palpation over the right hip Ext: No edema.   CNS: CN 2-12 intact, power tone and sensation normal throughout.   Skin: Intact, no visible lesions or rashes.  Psych: Good eye contact, normal affect.  Memory intact, not anxious or depressed appearing.    Impression & Recommendations:  Problem # 1:  HIP PAIN, RIGHT (ICD-719.45) Assessment Deteriorated  The following medications were removed from the medication list:    Tramadol Hcl 50 Mg Tabs (Tramadol hcl) ..... One tablet 3 times daily His updated medication list for this problem includes:    Baby Aspirin 81 Mg Chew (Aspirin) .Marland Kitchen... 1 by mouth once daily    Oxycodone-acetaminophen 5-325 Mg Tabs (Oxycodone-acetaminophen) .Marland Kitchen... Take two tablets by mouth as needed for pain    Cyclobenzaprine Hcl 10 Mg Tabs (Cyclobenzaprine hcl) .Marland Kitchen... Take 1 tab by mouth at bedtime as needed fro right hip pain  Orders: Radiology other (Radiology Other) Medicare Electronic Prescription [phone]) Orthopedic Referral (Ortho)  Problem #  2:  DIABETES MELLITUS, TYPE II (ICD-250.00) Assessment: Comment Only  His updated medication list for this problem includes:    Baby Aspirin 81 Mg Chew (Aspirin) .Marland Kitchen... 1 by mouth once daily    Glipizide 10 Mg Tabs (Glipizide) .Marland Kitchen... Take 1 tab bid    Losartan Potassium 100 Mg Tabs (Losartan potassium) .Marland Kitchen... Take 1 tablet by mouth once a day    Humalog Mix 50/50 Kwikpen 50-50 % Susp (Insulin lispro prot & lispro) .Marland KitchenMarland KitchenMarland KitchenMarland Kitchen 5 units can titrate up to 12  Labs Reviewed: Creat: 10.52 (08/26/2010)    Reviewed  HgBA1c results: 7.9 (01/21/2010)  Problem # 3:  HYPERTENSION (ICD-401.9) Assessment: Unchanged  His updated medication list for this problem includes:    Losartan Potassium 100 Mg Tabs (Losartan potassium) .Marland Kitchen... Take 1 tablet by mouth once a day    Norvasc 10 Mg Tabs (Amlodipine besylate) .Marland Kitchen... Take 1/2 tab daily  BP today: 126/50 Prior BP: 120/58 (09/24/2010)  Labs Reviewed: K+: 4.3 (08/26/2010) Creat: : 10.52 (08/26/2010)   Chol: 186 (08/21/2009)   HDL: 27 (08/21/2009)   LDL: 154 (08/26/2010)   TG: 373 (08/21/2009)  Problem # 4:  OBESITY (ICD-278.00) Assessment: Unchanged  Ht: 74 (11/02/2010)   Wt: 273 (11/02/2010)   BMI: 35.18 (11/02/2010)  Complete Medication List: 1)  Omeprazole 20 Mg Cpdr (Omeprazole) .Marland Kitchen.. 1 by mouth once daily 2)  Baby Aspirin 81 Mg Chew (Aspirin) .Marland Kitchen.. 1 by mouth once daily 3)  Sensipar 30 Mg Tabs (Cinacalcet hcl) .Marland Kitchen.. 1 by mouth once daily 4)  Senna Concentrate 8.6 Mg Tabs (Sennosides) .... 2 by mouth two times a day 5)  Gabapentin 300 Mg Caps (Gabapentin) .Marland Kitchen.. 1 by mouth two times a day 6)  Nephro-vite Rx 1 Mg Tabs (B complex-c-folic acid) .Marland Kitchen.. 1 by mouth once daily 7)  Glipizide 10 Mg Tabs (Glipizide) .... Take 1 tab bid 8)  Allopurinol 100 Mg Tabs (Allopurinol) .Marland Kitchen.. 1 by mouth once daily 9)  Sea-omega 300 Mg Caps (Omega-3 fatty acids) .... 2 by mouth three times a day 10)  Gemfibrozil 600 Mg Tabs (Gemfibrozil) .... Take 1 tab two times a day 11)  Trental 400 Mg Cr-tabs (Pentoxifylline) .... Take 1 tab three times a day 12)  Renvela 800 Mg Tabs (Sevelamer carbonate) .... Take 5 tabs three times a day before meals 13)  Zyrtec Allergy 10 Mg Tabs (Cetirizine hcl) .... One tab by mouth qd 14)  Nephro-vite 0.8 Mg Tabs (B complex-c-folic acid) .... One tab by mouth qd 15)  Lancets and Strips For Precison Meter  .... Two times a day testing 16)  Losartan Potassium 100 Mg Tabs (Losartan potassium) .... Take 1 tablet by mouth once a day 17)  Proctocort 30 Mg  Supp (Hydrocortisone acetate) .... Insert one suppositiry 3 times daily as for 1 week,then as needed 18)  Flonase 50 Mcg/act Susp (Fluticasone propionate) .... One puff per nosrtril daily for allergies 19)  Norvasc 10 Mg Tabs (Amlodipine besylate) .... Take 1/2 tab daily 20)  Humalog Mix 50/50 Kwikpen 50-50 % Susp (Insulin lispro prot & lispro) .... 5 units can titrate up to 12 21)  Oxycodone-acetaminophen 5-325 Mg Tabs (Oxycodone-acetaminophen) .... Take two tablets by mouth as needed for pain 22)  Voltaren 1 % Gel (Diclofenac sodium) .... Apply three times daily to affected hip as needed 23)  Cyclobenzaprine Hcl 10 Mg Tabs (Cyclobenzaprine hcl) .... Take 1 tab by mouth at bedtime as needed fro right hip pain  Patient Instructions: 1)  Please schedule a  follow-up appointment in 2 months. 2)  Gel sent in for local use and you may also try a muscle relaxant to see if this helps the right hip pain. 3)  Xray today, and a referral to ortho. 4)  PLS STOP TRAMADOL 5)  Hope you find out the prob soon and feel better soon Prescriptions: CYCLOBENZAPRINE HCL 10 MG TABS (CYCLOBENZAPRINE HCL) Take 1 tab by mouth at bedtime as needed fro right hip pain  #20 x 0   Entered and Authorized by:   Syliva Overman MD   Signed by:   Syliva Overman MD on 11/02/2010   Method used:   Electronically to        [facility] (retail)       [address]       [place], [place]  [address]       Ph: [phone]       Fax: [phone]   RxID:   [id number] VOLTAREN 1 % GEL (DICLOFENAC SODIUM) apply three times daily to affected hip as needed  #45 gm x 2   Entered and Authorized by:   Syliva Overman MD   Signed by:   Syliva Overman MD on 11/02/2010   Method used:   Electronically to        [facility] (retail)       [address]       [place], [place]  [address]       Ph: [phone]       Fax: [phone]   RxID:   [id number]    Orders  Added: 1)  Radiology other [Radiology Other] 2)  Est. Patient Level IV [35573] 3)  Medicare Electronic Prescription [G8553] 4)  Orthopedic Referral [Ortho]

## 2011-01-19 NOTE — Medication Information (Signed)
Summary: Tax adviser   Imported By: Lind Guest 06/12/2010 10:26:05  _____________________________________________________________________  External Attachment:    Type:   Image     Comment:   External Document

## 2011-01-19 NOTE — Progress Notes (Signed)
Summary: oxygen  Phone Note Call from Patient   Summary of Call: needs a form for a humdifer to put on his oxygen  tank  advanced  call back at [phone] Initial call taken by: Lind Guest,  January 28, 2010 4:50 PM  Follow-up for Phone Call        prescription written and in your box, let him know pls Follow-up by: Syliva Overman MD,  January 28, 2010 5:07 PM  Additional Follow-up for Phone Call Additional follow up Details #1::        returned call, no answer order sent Additional Follow-up by: Worthy Keeler LPN,  January 29, 2010 8:43 AM     Appended Document: oxygen patient aware

## 2011-01-19 NOTE — Miscellaneous (Signed)
Summary: labs lipid,liver,08/21/2009  Clinical Lists Changes  Observations: Added new observation of ALBUMIN: 4.0 g/dL (16/09/9603 54:09) Added new observation of PROTEIN, TOT: 6.2 g/dL (81/19/1478 29:56) Added new observation of SGPT (ALT): 18 units/L (08/21/2009 16:12) Added new observation of SGOT (AST): 20 units/L (08/21/2009 16:12) Added new observation of ALK PHOS: 82 units/L (08/21/2009 16:12) Added new observation of BILI DIRECT: 0.1 mg/dL (21/30/8657 84:69) Added new observation of LDL: 84 mg/dL (62/95/2841 32:44) Added new observation of HDL: 27 mg/dL (12/22/7251 66:44) Added new observation of TRIGLYC TOT: 373 mg/dL (03/47/4259 56:38) Added new observation of CHOLESTEROL: 186 mg/dL (75/64/3329 51:88)

## 2011-01-19 NOTE — Assessment & Plan Note (Signed)
Summary: sick - room 1   Vital Signs:  Patient profile:   60 year old male Height:      74 inches Weight:      279.75 pounds BMI:     36.05 O2 Sat:      92 % on Room air Pulse rate:   116 / minute Resp:     16 per minute BP sitting:   140 / 60  (right arm)  Vitals Entered By: Adella Hare LPN (February 20, 1609 11:03 AM) CC: chills, cough, chest congestion x 1 day Is Patient Diabetic? Yes Did you bring your meter with you today? No Pain Assessment Patient in pain? no        Primary Provider:  Syliva Overman MD  CC:  chills, cough, and chest congestion x 1 day.  History of Present Illness: Pt is here today with c/o chills & nonprod cough today.  His chest feels congested. He has had a little nasal contestion too.  No sinus pressure or sore throat.  He was at dialysis today & they prescribed a ZPak for him.  He has not picked it up yet from the pharm.  He is not taking any cough medication.   Current Medications (verified): 1)  Omeprazole 20 Mg  Cpdr (Omeprazole) .Marland Kitchen.. 1 By Mouth Once Daily 2)  Baby Aspirin 81 Mg  Chew (Aspirin) .Marland Kitchen.. 1 By Mouth Once Daily 3)  Sensipar 30 Mg  Tabs (Cinacalcet Hcl) .Marland Kitchen.. 1 By Mouth Once Daily 4)  Senna Concentrate 8.6 Mg  Tabs (Sennosides) .Marland Kitchen.. 1 By Mouth Two Times A Day 5)  Gabapentin 300 Mg  Caps (Gabapentin) .Marland Kitchen.. 1 By Mouth Two Times A Day 6)  Nephro-Vite Rx 1 Mg  Tabs (B Complex-C-Folic Acid) .Marland Kitchen.. 1 By Mouth Once Daily 7)  Glipizide 10 Mg Tabs (Glipizide) .... Take 1 Tab Bid 8)  Allopurinol 100 Mg  Tabs (Allopurinol) .Marland Kitchen.. 1 By Mouth Once Daily 9)  Sea-Omega 300 Mg  Caps (Omega-3 Fatty Acids) .... 2 By Mouth Three Times A Day 10)  Lisinopril 40 Mg Tabs (Lisinopril) .... 1/2 Tab Two Times A Day 11)  Gemfibrozil 600 Mg Tabs (Gemfibrozil) .... Take 1 Tab Two Times A Day 12)  Trental 400 Mg Cr-Tabs (Pentoxifylline) .... Take 1 Tab Three Times A Day 13)  Renvela 800 Mg Tabs (Sevelamer Carbonate) .... Take 5 Tabs Three Times A Day Before  Meals 14)  Zyrtec Allergy 10 Mg Tabs (Cetirizine Hcl) .... One Tab By Mouth Qd 15)  Nephro-Vite 0.8 Mg Tabs (B Complex-C-Folic Acid) .... One Tab By Mouth Qd 16)  Lancets and Strips For Precison Meter .... Two Times A Day Testing 17)  Tramadol Hcl 50 Mg Tabs (Tramadol Hcl) .... One Tablet Three Times Daily As Needed 18)  Pacerone 200 Mg Tabs (Amiodarone Hcl) .... Take 1 Tablet By Mouth Once A Day 19)  Doxycycline Hyclate 100 Mg Caps (Doxycycline Hyclate) .... Take 1 Capsule By Mouth Two Times A Day  Allergies (verified): 1)  Pcn  Past History:  Past medical history reviewed for relevance to current acute and chronic problems.  Past Medical History: Reviewed history from 09/24/2009 and no changes required. Obesity; sleep apnea; treatment with BiPAP Anemia-NOS Diabetes mellitus, type II-no insulin  since 1993 GERD ESRD-on dialysis  since 2007 hyperparathyroidism, secondary Allergic rhinitis Hyperlipidemia since 2008 Hypertension   since 1993 Evaluation for possible coronary disease-normal coronary angiography in 2/09 Lower gastrointestinal bleed in 5/09: Diverticular disease and hemorrhoids identified constipation Hospitalised  for 4 days at [facility] in 08/2009 wit bronchitis, presented with chest pain [facility] normal Chronic back pain, established in [place] that he has  disc disease.  Review of Systems General:  Complains of chills and fever. ENT:  Complains of nasal congestion; denies earache, postnasal drainage, sinus pressure, and sore throat. CV:  Denies chest pain or discomfort. Resp:  Complains of cough; denies shortness of breath, sputum productive, and wheezing. Heme:  Denies enlarge lymph nodes.  Physical Exam  General:  Well-developed,well-nourished,in no acute distress; alert,appropriate and cooperative throughout examination Head:  Normocephalic and atraumatic without obvious abnormalities. No apparent alopecia or balding. Ears:   External ear exam shows no significant lesions or deformities.  Otoscopic examination reveals clear canals, tympanic membranes are intact bilaterally without bulging, retraction, inflammation or discharge. Hearing is grossly normal bilaterally. Nose:  External nasal examination shows no deformity or inflammation. Nasal mucosa are pink and moist without lesions or exudates.no sinus percussion tenderness.   Mouth:  Oral mucosa and oropharynx without lesions or exudates.  Teeth in good repair. Neck:  No deformities, masses, or tenderness noted. Lungs:  Normal respiratory effort, chest expands symmetrically. Lungs are clear to auscultation, no crackles or wheezes. Heart:  Normal rate and regular rhythm. S1 and S2 normal without gallop, murmur, click, rub or other extra sounds. Cervical Nodes:  No lymphadenopathy noted Psych:  Cognition and judgment appear intact. Alert and cooperative with normal attention span and concentration. No apparent delusions, illusions, hallucinations   Impression & Recommendations:  Problem # 1:  ACUTE BRONCHITIS (ICD-466.0) Assessment New Advised pt to pick up & start the Z-pak that was prescribed for him today. Recommend he use Mucinex DM to help with chest congestion & cough.  His updated medication list for this problem includes:    Doxycycline Hyclate 100 Mg Caps (Doxycycline hyclate) .Marland Kitchen... Take 1 capsule by mouth two times a day  Problem # 2:  ESRD (ICD-585.6) Assessment: Unchanged  Labs Reviewed: BUN: 15 (12/30/2008)   Cr: 10.43 (08/18/2009)    Hgb: 10.8 (08/18/2009)   Hct: 45.5 (12/30/2008)    TP: 6.2 (08/21/2009)   Alb: 4.0 (08/21/2009)  Problem # 3:  HYPERTENSION (ICD-401.9) Assessment: Unchanged  His updated medication list for this problem includes:    Lisinopril 40 Mg Tabs (Lisinopril) .Marland Kitchen... 1/2 tab two times a day  BP today: 140/60 Prior BP: 130/58 (01/21/2010)  Labs Reviewed: K+: 3.9 (08/18/2009) Creat: : 10.43 (08/18/2009)   Chol: 186  (08/21/2009)   HDL: 27 (08/21/2009)   LDL: 84 (08/21/2009)   TG: 373 (08/21/2009)  Complete Medication List: 1)  Omeprazole 20 Mg Cpdr (Omeprazole) .Marland Kitchen.. 1 by mouth once daily 2)  Baby Aspirin 81 Mg Chew (Aspirin) .Marland Kitchen.. 1 by mouth once daily 3)  Sensipar 30 Mg Tabs (Cinacalcet hcl) .Marland Kitchen.. 1 by mouth once daily 4)  Senna Concentrate 8.6 Mg Tabs (Sennosides) .Marland Kitchen.. 1 by mouth two times a day 5)  Gabapentin 300 Mg Caps (Gabapentin) .Marland Kitchen.. 1 by mouth two times a day 6)  Nephro-vite Rx 1 Mg Tabs (B complex-c-folic acid) .Marland Kitchen.. 1 by mouth once daily 7)  Glipizide 10 Mg Tabs (Glipizide) .... Take 1 tab bid 8)  Allopurinol 100 Mg Tabs (Allopurinol) .Marland Kitchen.. 1 by mouth once daily 9)  Sea-omega 300 Mg Caps (Omega-3 fatty acids) .... 2 by mouth three times a day 10)  Lisinopril 40 Mg Tabs (Lisinopril) .... 1/2 tab two times a day 11)  Gemfibrozil 600 Mg Tabs (Gemfibrozil) .... Take  1 tab two times a day 12)  Trental 400 Mg Cr-tabs (Pentoxifylline) .... Take 1 tab three times a day 13)  Renvela 800 Mg Tabs (Sevelamer carbonate) .... Take 5 tabs three times a day before meals 14)  Zyrtec Allergy 10 Mg Tabs (Cetirizine hcl) .... One tab by mouth qd 15)  Nephro-vite 0.8 Mg Tabs (B complex-c-folic acid) .... One tab by mouth qd 16)  Lancets and Strips For Precison Meter  .... Two times a day testing 17)  Tramadol Hcl 50 Mg Tabs (Tramadol hcl) .... One tablet three times daily as needed 18)  Pacerone 200 Mg Tabs (Amiodarone hcl) .... Take 1 tablet by mouth once a day 19)  Doxycycline Hyclate 100 Mg Caps (Doxycycline hyclate) .... Take 1 capsule by mouth two times a day  Patient Instructions: 1)  Get plenty of rest, drink lots of clear liquids, and use Tylenol or Ibuprofen for fever and comfort. Return in 7-10 days if you're not better:sooner if you're feeling worse. 2)  Pick up the Z-Pak prescription that was prescribed for you. 3)  You may use Mucinex DM as needed for cough.  Talk to the pharmacist.

## 2011-01-19 NOTE — Progress Notes (Signed)
Summary: hurts kidney area  Phone Note Call from Patient   Summary of Call:  hurts bad in his back in his kidney area dr Orie Fisherman ruled it was not his back it is in the kidney area what should he do ? call back at  [phone] he has been to the ed  and gave him shots and the pain came back next day Initial call taken by: Lind Guest,  October 29, 2010 3:53 PM  Follow-up for Phone Call        he needs to keep the appts he keeps missing here so that i can see how i can help him, pls let him know in a nice way Follow-up by: Syliva Overman MD,  October 29, 2010 5:20 PM  Additional Follow-up for Phone Call Additional follow up Details #1::        LEFT MESSAGE Additional Follow-up by: Lind Guest,  October 30, 2010 11:37 AM    Additional Follow-up for Phone Call Additional follow up Details #2::    will come in monday for an appoinment Follow-up by: Lind Guest,  October 30, 2010 3:25 PM

## 2011-01-19 NOTE — Assessment & Plan Note (Signed)
Summary: ROV   Visit Type:  Follow-up Primary Provider:  Syliva Overman MD   History of Present Illness: [name] returns to the office the beyond his anticipated appointment for continuing evaluation and treatment of paroxysmal atrial fibrillation, hypertension and hyperlipidemia.  Since his previous visit, he has done extremely well from a symptomatic standpoint.  Dialysis has proceeded without incident.  He has had no dyspnea, orthopnea, PND or chest discomfort.  He does note intermittent pedal edema and decreased sensation in his lower extremities.  EKG  Procedure date:  09/17/2010  Findings:      Rhythm Strip  Normal sinus rhythm with borderline first degree AV block   Current Medications (verified): 1)  Omeprazole 20 Mg  Cpdr (Omeprazole) .Marland Kitchen.. 1 By Mouth Once Daily 2)  Baby Aspirin 81 Mg  Chew (Aspirin) .Marland Kitchen.. 1 By Mouth Once Daily 3)  Sensipar 30 Mg  Tabs (Cinacalcet Hcl) .Marland Kitchen.. 1 By Mouth Once Daily 4)  Senna Concentrate 8.6 Mg  Tabs (Sennosides) .... 2 By Mouth Two Times A Day 5)  Gabapentin 300 Mg  Caps (Gabapentin) .Marland Kitchen.. 1 By Mouth Two Times A Day 6)  Nephro-Vite Rx 1 Mg  Tabs (B Complex-C-Folic Acid) .Marland Kitchen.. 1 By Mouth Once Daily 7)  Glipizide 10 Mg Tabs (Glipizide) .... Take 1 Tab Bid 8)  Allopurinol 100 Mg  Tabs (Allopurinol) .Marland Kitchen.. 1 By Mouth Once Daily 9)  Sea-Omega 300 Mg  Caps (Omega-3 Fatty Acids) .... 2 By Mouth Three Times A Day 10)  Gemfibrozil 600 Mg Tabs (Gemfibrozil) .... Take 1 Tab Two Times A Day 11)  Trental 400 Mg Cr-Tabs (Pentoxifylline) .... Take 1 Tab Three Times A Day 12)  Renvela 800 Mg Tabs (Sevelamer Carbonate) .... Take 5 Tabs Three Times A Day Before Meals 13)  Zyrtec Allergy 10 Mg Tabs (Cetirizine Hcl) .... One Tab By Mouth Qd 14)  Nephro-Vite 0.8 Mg Tabs (B Complex-C-Folic Acid) .... One Tab By Mouth Qd 15)  Lancets and Strips For Precison Meter .... Two Times A Day Testing 16)  Tramadol Hcl 50 Mg Tabs (Tramadol Hcl) .... Take 1 Every 12  Hours As Needed For Pain. 17)  Losartan Potassium 100 Mg Tabs (Losartan Potassium) .... Take 1 Tablet By Mouth Once A Day 18)  Proctocort 30 Mg Supp (Hydrocortisone Acetate) .... Insert One Suppositiry 3 Times Daily As For 1 Week,then As Needed 19)  Flonase 50 Mcg/act Susp (Fluticasone Propionate) .... One Puff Per Nosrtril Daily For Allergies 20)  Norvasc 10 Mg Tabs (Amlodipine Besylate) .... Take 1/2 Tab Daily 21)  Humalog Mix 50/50 Kwikpen 50-50 % Susp (Insulin Lispro Prot & Lispro) .... 5 Units Can Titrate Up To 12  Allergies (verified): 1)  Pcn  Comments:  Nurse/Medical Assistant: patient brought med bottles also stated amiodarone was stopped  norvasc started per Dr.Simpson .called Dr.preston clark to verify insulin type and dose.humalog 5 units can titrate up to 12 units.  Past History:  PMH, FH, and Social History reviewed and updated.  Past Medical History: Paroxysmal atrial fibrillation: normal coronary angiography in 2/09 Hyperlipidemia since 2008 Hypertension   since 1993 Diabetes mellitus, type II-no insulin  since 1993 ESRD-on dialysis  since 2007 Obesity; sleep apnea; treatment with BiPAP Anemia-NOS Gastroesophageal reflux disease Hyperparathyroidism, secondary Allergic rhinitis Lower gastrointestinal bleed in 5/09: Diverticular disease and hemorrhoids identified; constipation Prostate followed at the [facility] anually, normal Chronic back pain-degenerative disc disease  Past Surgical History: Back surgery-shunt palcement Cholecystectomy-1999 Abdominal surgery-secondary to shunt perforation Bilateral cataract surgwery  with implants 2008  Review of Systems       See history of present illness.  Vital Signs:  Patient profile:   60 year old male Weight:      278 pounds Pulse rate:   98 / minute BP sitting:   137 / 62  (right arm)  Vitals Entered By: Dreama Saa, CNA (September 17, 2010 1:13 PM)  Physical Exam  General:  Obese; well developed; no acute  distress:   Neck-No JVD; no carotid bruits: Lungs-No tachypnea, no rales; no rhonchi; no wheezes: Cardiovascular-normal PMI; normal S1 and S2; modest systolic ejection murmur at the base Abdomen-BS normal; soft and non-tender without masses or organomegaly:  Musculoskeletal-No deformities, no cyanosis or clubbing: Neurologic-Normal cranial nerves; symmetric strength and tone:  Skin-Warm, dry, especially over lower extremities Extremities-Nl distal pulses; trace edema:      Impression & Recommendations:  Problem # 1:  ATRIAL FIBRILLATION, PAROXYSMAL (ICD-427.31) No symptoms to suggest recurrent AF.   On ASA as an anti-thrombotic.  Problem # 2:  HYPERLIPIDEMIA (ICD-272.4) Most recent lipid profile showed adequate control of Total and LDL cholesterol but suboptimal HDL and TGs.  This will be updated with a repeat lipid profile.  Complete metabolic profile and TSH will also be assessed.  CHOL: 186 (08/21/2009)   LDL: 84 (08/21/2009)   HDL: 27 (08/21/2009)   TG: 373 (08/21/2009)  Problem # 3:  DIABETES MELLITUS, TYPE II (ICD-250.00) Low HDL and high triglycerides suggest suboptimal control of diabetes.  Problem # 4:  HYPERTENSION (ICD-401.9) BP control is good with current meds.  Most recent CXR in January showed a RLL infiltrate.  Repeat exam will be obtained.  I will reassess this nice gentleman in 8 months.  Other Orders: T-Chest x-ray, 2 views ([id number]) Future Orders: T-Comprehensive Metabolic Panel ([id number]) ... 09/21/2010 T-Lipid Profile [phone]) ... 11/19/2010 T-TSH [phone]) ... 11/19/2010  Patient Instructions: 1)  Your physician recommends that you schedule a follow-up appointment in: 8 months 2)  Your physician recommends that you return for lab work in: next week  3)  A chest x-ray takes a picture of the organs and structures inside the chest, including the heart, lungs, and blood vessels. This test can show several things, including, whether the heart is  enlarged; whether fluid is building up in the lungs; and whether pacemaker / defibrillator leads are still in place.

## 2011-01-21 NOTE — Letter (Signed)
Summary: diabetic shoes  diabetic shoes   Imported By: Lind Guest 01/13/2011 14:29:19  _____________________________________________________________________  External Attachment:    Type:   Image     Comment:   External Document

## 2011-01-26 ENCOUNTER — Encounter: Payer: Self-pay | Admitting: Family Medicine

## 2011-02-04 NOTE — Letter (Signed)
Summary: THERAPEUTIC SHOES  THERAPEUTIC SHOES   Imported By: Lind Guest 01/26/2011 14:55:34  _____________________________________________________________________  External Attachment:    Type:   Image     Comment:   External Document

## 2011-02-04 NOTE — Assessment & Plan Note (Signed)
Summary: follow up 2 month/slj   Vital Signs:  Patient profile:   60 year old male Height:      74 inches Weight:      270 pounds BMI:     34.79 O2 Sat:      93 % Pulse rate:   77 / minute Pulse rhythm:   regular Resp:     16 per minute BP sitting:   128 / 52  (left arm)  Vitals Entered By: Everitt Amber LPN (January 12, 2011 3:00 PM)  Nutrition Counseling: Patient's BMI is greater than 25 and therefore counseled on weight management options.   Primary Care Provider:  Syliva Overman MD   History of Present Illness: Reports  that he is doing fairly well. Denies recent fever or chills. Denies sinus pressure, nasal congestion , ear pain or sore throat. Denies chest congestion, or cough productive of sputum. Denies chest pain, palpitations, PND, orthopnea or leg swelling. Denies abdominal pain, nausea, vomitting, diarrhea or constipation. Denies change in bowel movements or bloody stool. Denies dysuria , frequency, incontinence or hesitancy.  Denies headaches, vertigo, seizures. Denies depression, anxiety or insomnia.   Allergies (verified): 1)  Pcn  Review of Systems      See HPI General:  Complains of fatigue. Eyes:  Denies discharge, eye pain, and red eye. MS:  Complains of joint pain, low back pain, mid back pain, and stiffness. Endo:  Denies excessive thirst and excessive urination; blood sugars tested at least once daily and are within range reportedly. Heme:  Denies abnormal bruising and bleeding. Allergy:  Complains of seasonal allergies; denies hives or rash and itching eyes.  Physical Exam  General:  Well-developed,well-nourished,in no acute distress; alert,appropriate and cooperative throughout examination HEENT: No facial asymmetry,  EOMI, No sinus tenderness, TM's Clear, oropharynx  pink and moist.   Chest: Clear to auscultation bilaterally.  CVS: S1, S2, No murmurs, No S3.   Abd: Soft, Nontender.  MS: decreased  ROM spine, hips, shoulders and knees.    Ext: No edema.   CNS: CN 2-12 intact, power tone and sensation normal throughout.   Skin: Intact, bilaterasl onychomycosis  Psych: Good eye contact, normal affect.  Memory intact, not anxious or depressed appearing.   Diabetes Management Exam:    Foot Exam (with socks and/or shoes not present):       Sensory-Monofilament:          Left foot: absent          Right foot: absent       Inspection:          Left foot: normal          Right foot: normal       Nails:          Left foot: normal          Right foot: normal   Impression & Recommendations:  Problem # 1:  HYPERLIPIDEMIA (ICD-272.4) Assessment Comment Only  His updated medication list for this problem includes:    Gemfibrozil 600 Mg Tabs (Gemfibrozil) .Marland Kitchen... Take 1 tab two times a day Low fat dietdiscussed and encouraged  Labs Reviewed: SGOT: 14 (08/26/2010)   SGPT: 15 (08/26/2010)   HDL:27 (08/21/2009), 27 (08/21/2009)  LDL:154 (08/26/2010), 84 (78/46/9629)  Chol:186 (08/21/2009), 186 (08/21/2009)  Trig:373 (08/21/2009), 373 (08/21/2009)  Problem # 2:  DIABETES MELLITUS, TYPE II (ICD-250.00) Assessment: Comment Only  His updated medication list for this problem includes:    Baby Aspirin 81 Mg Chew (Aspirin) .Marland KitchenMarland KitchenMarland KitchenMarland Kitchen  1 by mouth once daily    Glipizide 10 Mg Tabs (Glipizide) .Marland Kitchen... Take 1 tab bid    Losartan Potassium 100 Mg Tabs (Losartan potassium) .Marland Kitchen... Take 1 tablet by mouth once a day    Humalog Mix 50/50 Kwikpen 50-50 % Susp (Insulin lispro prot & lispro) .Marland KitchenMarland KitchenMarland KitchenMarland Kitchen 5 units can titrate up to 12 followed by endocrine Orders: Medicare Electronic Prescription [phone])  Labs Reviewed: Creat: 10.52 (08/26/2010)    Reviewed HgBA1c results: 7.9 (01/21/2010)  Problem # 3:  HEMORRHOIDS (ICD-455.6) Assessment: Unchanged proctocort prescribed  Problem # 4:  ONYCHOMYCOSIS, BILATERAL (ICD-110.1) Assessment: Comment Only  podiatry to cut toenails  Orders: Podiatry Referral (Podiatry)  Problem # 5:  LEG PAIN, BILATERAL  (ICD-729.5) Assessment: Unchanged  Complete Medication List: 1)  Omeprazole 20 Mg Cpdr (Omeprazole) .Marland Kitchen.. 1 by mouth once daily 2)  Baby Aspirin 81 Mg Chew (Aspirin) .Marland Kitchen.. 1 by mouth once daily 3)  Sensipar 30 Mg Tabs (Cinacalcet hcl) .Marland Kitchen.. 1 by mouth once daily 4)  Senna Concentrate 8.6 Mg Tabs (Sennosides) .... 2 by mouth two times a day 5)  Gabapentin 300 Mg Caps (Gabapentin) .Marland Kitchen.. 1 by mouth two times a day 6)  Glipizide 10 Mg Tabs (Glipizide) .... Take 1 tab bid 7)  Allopurinol 100 Mg Tabs (Allopurinol) .Marland Kitchen.. 1 by mouth once daily 8)  Sea-omega 300 Mg Caps (Omega-3 fatty acids) .... 2 by mouth three times a day 9)  Gemfibrozil 600 Mg Tabs (Gemfibrozil) .... Take 1 tab two times a day 10)  Trental 400 Mg Cr-tabs (Pentoxifylline) .... Take 1 tab three times a day 11)  Renvela 800 Mg Tabs (Sevelamer carbonate) .... Take 5 tabs three times a day before meals 12)  Loratadine 10 Mg Tabs (Loratadine) .... Take 1 tablet by mouth once a day 13)  Nephro-vite 0.8 Mg Tabs (B complex-c-folic acid) .... One tab by mouth qd 14)  Lancets and Strips For Precison Meter  .... Two times a day testing 15)  Losartan Potassium 100 Mg Tabs (Losartan potassium) .... Take 1 tablet by mouth once a day 16)  Proctocort 30 Mg Supp (Hydrocortisone acetate) .... Insert one suppositiry 3 times daily as for 1 week,then as needed 17)  Flonase 50 Mcg/act Susp (Fluticasone propionate) .... One puff per nosrtril daily for allergies 18)  Norvasc 10 Mg Tabs (Amlodipine besylate) .... Take 1/2 tab daily 19)  Humalog Mix 50/50 Kwikpen 50-50 % Susp (Insulin lispro prot & lispro) .... 5 units can titrate up to 12 20)  Oxycodone-acetaminophen 5-325 Mg Tabs (Oxycodone-acetaminophen) .... Take two tablets by mouth as needed for pain 21)  Voltaren 1 % Gel (Diclofenac sodium) .... Apply three times daily to affected hip as needed 22)  Cyclobenzaprine Hcl 10 Mg Tabs (Cyclobenzaprine hcl) .... Take 1 tab by mouth at bedtime as needed fro  right hip pain  Patient Instructions: 1)  Please schedule a follow-up appointment in 4 months. 2)  You are referred to a skin doctor about the tag on your right forearm. 3)  You are  referred  to a podiatri ist for toenail cutting Prescriptions: PROCTOCORT 30 MG SUPP (HYDROCORTISONE ACETATE) insert one suppositiry 3 times daily as for 1 week,then as needed  #20 x 3   Entered by:   Adella Hare LPN   Authorized by:   Syliva Overman MD   Signed by:   Adella Hare LPN on 60/45/4098   Method used:   Electronically to        [facility] (retail)       [address]       [place], [place]  [address]       Ph: [phone]       Fax: [phone]   RxID:   [id number] TRENTAL 400 MG CR-TABS (PENTOXIFYLLINE) take 1 tab three times a day  #90 x 3   Entered by:   Adella Hare LPN   Authorized by:   Syliva Overman MD   Signed by:   Adella Hare LPN on 18/84/1660   Method used:   Electronically to        [facility] (retail)       [address]       [place], [place]  [address]       Ph: [phone]       Fax: [phone]   RxID:   [id number] GEMFIBROZIL 600 MG TABS (GEMFIBROZIL) take 1 tab two times a day  #60 x 3   Entered by:   Adella Hare LPN   Authorized by:   Syliva Overman MD   Signed by:   Adella Hare LPN on 73/71/0626   Method used:   Electronically to        [facility] (retail)       [address]       [place], [place]  [address]       Ph: [phone]       Fax: [phone]   RxID:   [id number]    Orders Added: 1)  Est. Patient Level IV [85277] 2)  Medicare Electronic Prescription [G8553] 3)  Podiatry Referral [Podiatry]

## 2011-02-16 ENCOUNTER — Ambulatory Visit: Payer: Medicare Other | Admitting: Family Medicine

## 2011-02-16 ENCOUNTER — Encounter: Payer: Self-pay | Admitting: Family Medicine

## 2011-02-25 NOTE — Letter (Signed)
Summary: 1st no show  1st no show   Imported By: Lind Guest 02/16/2011 13:54:47  _____________________________________________________________________  External Attachment:    Type:   Image     Comment:   External Document

## 2011-02-26 ENCOUNTER — Encounter: Payer: Self-pay | Admitting: Family Medicine

## 2011-03-01 LAB — CBC
Platelets: 149 10*3/uL — ABNORMAL LOW (ref 150–400)
RBC: 3.47 MIL/uL — ABNORMAL LOW (ref 4.22–5.81)
RDW: 13.5 % (ref 11.5–15.5)
WBC: 6.1 10*3/uL (ref 4.0–10.5)

## 2011-03-01 LAB — BASIC METABOLIC PANEL
BUN: 18 mg/dL (ref 6–23)
Calcium: 9.7 mg/dL (ref 8.4–10.5)
Creatinine, Ser: 8.67 mg/dL — ABNORMAL HIGH (ref 0.4–1.5)
GFR calc Af Amer: 8 mL/min — ABNORMAL LOW (ref >60)
GFR calc non Af Amer: 6 mL/min — ABNORMAL LOW (ref >60)

## 2011-03-01 LAB — DIFFERENTIAL
Eosinophils Absolute: 0 10*3/uL (ref 0.0–0.7)
Lymphs Abs: 1.6 10*3/uL (ref 0.7–4.0)
Monocytes Relative: 14 % — ABNORMAL HIGH (ref 3–12)
Neutrophils Relative %: 60 % (ref 43–77)

## 2011-03-03 LAB — CBC
HCT: 33.5 % — ABNORMAL LOW (ref 39.0–52.0)
Hemoglobin: 11.3 g/dL — ABNORMAL LOW (ref 13.0–17.0)
MCH: 31.8 pg (ref 26.0–34.0)
MCHC: 33.7 g/dL (ref 30.0–36.0)
MCV: 94.5 fL (ref 78.0–100.0)

## 2011-03-03 LAB — BASIC METABOLIC PANEL
CO2: 27 mEq/L (ref 19–32)
Glucose, Bld: 89 mg/dL (ref 70–99)
Potassium: 4 mEq/L (ref 3.5–5.1)
Sodium: 137 mEq/L (ref 135–145)

## 2011-03-03 LAB — DIFFERENTIAL
Basophils Relative: 0 % (ref 0–1)
Eosinophils Absolute: 0 10*3/uL (ref 0.0–0.7)
Monocytes Absolute: 0.6 10*3/uL (ref 0.1–1.0)
Monocytes Relative: 12 % (ref 3–12)

## 2011-03-04 ENCOUNTER — Encounter: Payer: Self-pay | Admitting: Family Medicine

## 2011-03-04 ENCOUNTER — Ambulatory Visit (INDEPENDENT_AMBULATORY_CARE_PROVIDER_SITE_OTHER): Payer: Medicare Other | Admitting: Family Medicine

## 2011-03-04 DIAGNOSIS — R1310 Dysphagia, unspecified: Secondary | ICD-10-CM | POA: Insufficient documentation

## 2011-03-04 DIAGNOSIS — K219 Gastro-esophageal reflux disease without esophagitis: Secondary | ICD-10-CM

## 2011-03-04 DIAGNOSIS — I10 Essential (primary) hypertension: Secondary | ICD-10-CM

## 2011-03-04 DIAGNOSIS — E119 Type 2 diabetes mellitus without complications: Secondary | ICD-10-CM

## 2011-03-04 DIAGNOSIS — E049 Nontoxic goiter, unspecified: Secondary | ICD-10-CM | POA: Insufficient documentation

## 2011-03-05 ENCOUNTER — Other Ambulatory Visit: Payer: Self-pay | Admitting: Family Medicine

## 2011-03-05 ENCOUNTER — Encounter: Payer: Self-pay | Admitting: Family Medicine

## 2011-03-05 DIAGNOSIS — E049 Nontoxic goiter, unspecified: Secondary | ICD-10-CM

## 2011-03-07 LAB — CBC
HCT: 38.4 % — ABNORMAL LOW (ref 39.0–52.0)
HCT: 32.2 % — ABNORMAL LOW (ref 39.0–52.0)
HCT: 33 % — ABNORMAL LOW (ref 39.0–52.0)
HCT: 36.2 % — ABNORMAL LOW (ref 39.0–52.0)
Hemoglobin: 11.2 g/dL — ABNORMAL LOW (ref 13.0–17.0)
MCHC: 33.1 g/dL (ref 30.0–36.0)
MCHC: 33.8 g/dL (ref 30.0–36.0)
MCHC: 34 g/dL (ref 30.0–36.0)
MCV: 95.5 fL (ref 78.0–100.0)
MCV: 96 fL (ref 78.0–100.0)
MCV: 96.4 fL (ref 78.0–100.0)
Platelets: 103 10*3/uL — ABNORMAL LOW (ref 150–400)
Platelets: 108 10*3/uL — ABNORMAL LOW (ref 150–400)
Platelets: 143 10*3/uL — ABNORMAL LOW (ref 150–400)
RBC: 3.26 MIL/uL — ABNORMAL LOW (ref 4.22–5.81)
RBC: 3.76 MIL/uL — ABNORMAL LOW (ref 4.22–5.81)
RDW: 14.5 % (ref 11.5–15.5)
RDW: 14.7 % (ref 11.5–15.5)
RDW: 14.8 % (ref 11.5–15.5)
RDW: 14.9 % (ref 11.5–15.5)
WBC: 14.8 10*3/uL — ABNORMAL HIGH (ref 4.0–10.5)
WBC: 20 10*3/uL — ABNORMAL HIGH (ref 4.0–10.5)

## 2011-03-07 LAB — RENAL FUNCTION PANEL
Albumin: 2.4 g/dL — ABNORMAL LOW (ref 3.5–5.2)
BUN: 33 mg/dL — ABNORMAL HIGH (ref 6–23)
BUN: 43 mg/dL — ABNORMAL HIGH (ref 6–23)
CO2: 26 mEq/L (ref 19–32)
Calcium: 8.3 mg/dL — ABNORMAL LOW (ref 8.4–10.5)
Chloride: 91 mEq/L — ABNORMAL LOW (ref 96–112)
Chloride: 94 mEq/L — ABNORMAL LOW (ref 96–112)
Creatinine, Ser: 12.58 mg/dL — ABNORMAL HIGH (ref 0.4–1.5)
Creatinine, Ser: 8.99 mg/dL — ABNORMAL HIGH (ref 0.4–1.5)
GFR calc Af Amer: 5 mL/min — ABNORMAL LOW (ref >60)
GFR calc non Af Amer: 5 mL/min — ABNORMAL LOW (ref >60)
Glucose, Bld: 159 mg/dL — ABNORMAL HIGH (ref 70–99)
Glucose, Bld: 228 mg/dL — ABNORMAL HIGH (ref 70–99)
Phosphorus: 8.1 mg/dL — ABNORMAL HIGH (ref 2.3–4.6)
Potassium: 3.7 mEq/L (ref 3.5–5.1)
Sodium: 132 mEq/L — ABNORMAL LOW (ref 135–145)

## 2011-03-07 LAB — ABO/RH: ABO/RH(D): B POS

## 2011-03-07 LAB — GLUCOSE, CAPILLARY
Glucose-Capillary: 79 mg/dL (ref 70–99)
Glucose-Capillary: 111 mg/dL — ABNORMAL HIGH (ref 70–99)
Glucose-Capillary: 120 mg/dL — ABNORMAL HIGH (ref 70–99)
Glucose-Capillary: 146 mg/dL — ABNORMAL HIGH (ref 70–99)
Glucose-Capillary: 152 mg/dL — ABNORMAL HIGH (ref 70–99)
Glucose-Capillary: 157 mg/dL — ABNORMAL HIGH (ref 70–99)
Glucose-Capillary: 167 mg/dL — ABNORMAL HIGH (ref 70–99)
Glucose-Capillary: 174 mg/dL — ABNORMAL HIGH (ref 70–99)
Glucose-Capillary: 212 mg/dL — ABNORMAL HIGH (ref 70–99)
Glucose-Capillary: 219 mg/dL — ABNORMAL HIGH (ref 70–99)
Glucose-Capillary: 72 mg/dL (ref 70–99)
Glucose-Capillary: 94 mg/dL (ref 70–99)
Glucose-Capillary: 96 mg/dL (ref 70–99)

## 2011-03-07 LAB — BLOOD GAS, ARTERIAL
Acid-base deficit: 7.1 mmol/L — ABNORMAL HIGH (ref 0.0–2.0)
Acid-base deficit: 6.3 mmol/L — ABNORMAL HIGH (ref 0.0–2.0)
FIO2: 0.5 %
O2 Saturation: 94.4 %
Patient temperature: 37
Patient temperature: 98.5
TCO2: 15.6 mmol/L (ref 0–100)
pO2, Arterial: 86.1 mmHg (ref 80.0–100.0)

## 2011-03-07 LAB — CARDIAC PANEL(CRET KIN+CKTOT+MB+TROPI)
CK, MB: 4.2 ng/mL — ABNORMAL HIGH (ref 0.3–4.0)
CK, MB: 4.9 ng/mL — ABNORMAL HIGH (ref 0.3–4.0)
CK, MB: 9.4 ng/mL (ref 0.3–4.0)
Relative Index: 1.9 (ref 0.0–2.5)
Relative Index: 3 — ABNORMAL HIGH (ref 0.0–2.5)
Relative Index: 3.8 — ABNORMAL HIGH (ref 0.0–2.5)
Total CK: 182 U/L (ref 7–232)
Total CK: 319 U/L — ABNORMAL HIGH (ref 7–232)
Total CK: 375 U/L — ABNORMAL HIGH (ref 7–232)
Total CK: 544 U/L — ABNORMAL HIGH (ref 7–232)
Troponin I: 0.32 ng/mL — ABNORMAL HIGH (ref 0.00–0.06)
Troponin I: 0.9 ng/mL (ref 0.00–0.06)
Troponin I: 1.07 ng/mL (ref 0.00–0.06)
Troponin I: 2.16 ng/mL (ref 0.00–0.06)

## 2011-03-07 LAB — FECAL LACTOFERRIN, QUANT

## 2011-03-07 LAB — D-DIMER, QUANTITATIVE: D-Dimer, Quant: 5.26 ug/mL-FEU — ABNORMAL HIGH (ref 0.00–0.48)

## 2011-03-07 LAB — CLOSTRIDIUM DIFFICILE EIA: C difficile Toxins A+B, EIA: NEGATIVE

## 2011-03-07 LAB — DIFFERENTIAL
Basophils Absolute: 0 10*3/uL (ref 0.0–0.1)
Basophils Absolute: 0 10*3/uL (ref 0.0–0.1)
Basophils Relative: 0 % (ref 0–1)
Eosinophils Absolute: 0 10*3/uL (ref 0.0–0.7)
Eosinophils Relative: 0 % (ref 0–5)
Eosinophils Relative: 0 % (ref 0–5)
Lymphocytes Relative: 6 % — ABNORMAL LOW (ref 12–46)
Monocytes Absolute: 1.9 10*3/uL — ABNORMAL HIGH (ref 0.1–1.0)
Monocytes Relative: 10 % (ref 3–12)
Neutrophils Relative %: 84 % — ABNORMAL HIGH (ref 43–77)

## 2011-03-07 LAB — WOUND CULTURE: Gram Stain: NONE SEEN

## 2011-03-07 LAB — CULTURE, BLOOD (ROUTINE X 2)
Culture: NO GROWTH
Culture: NO GROWTH
Report Status: 1232011
Report Status: 1232011

## 2011-03-07 LAB — CARBOXYHEMOGLOBIN
O2 Saturation: 57.7 %
Total hemoglobin: 12 g/dL — ABNORMAL LOW (ref 13.5–18.0)

## 2011-03-07 LAB — COMPREHENSIVE METABOLIC PANEL
ALT: 25 U/L (ref 0–53)
AST: 35 U/L (ref 0–37)
AST: 36 U/L (ref 0–37)
Albumin: 2.9 g/dL — ABNORMAL LOW (ref 3.5–5.2)
Albumin: 2.7 g/dL — ABNORMAL LOW (ref 3.5–5.2)
Alkaline Phosphatase: 62 U/L (ref 39–117)
Alkaline Phosphatase: 71 U/L (ref 39–117)
Alkaline Phosphatase: 72 U/L (ref 39–117)
BUN: 67 mg/dL — ABNORMAL HIGH (ref 6–23)
CO2: 20 mEq/L (ref 19–32)
CO2: 24 mEq/L (ref 19–32)
Calcium: 8.3 mg/dL — ABNORMAL LOW (ref 8.4–10.5)
Chloride: 93 mEq/L — ABNORMAL LOW (ref 96–112)
Chloride: 96 mEq/L (ref 96–112)
GFR calc Af Amer: 3 mL/min — ABNORMAL LOW (ref >60)
GFR calc Af Amer: 7 mL/min — ABNORMAL LOW (ref >60)
GFR calc non Af Amer: 3 mL/min — ABNORMAL LOW (ref >60)
GFR calc non Af Amer: 5 mL/min — ABNORMAL LOW (ref >60)
Potassium: 4.9 mEq/L (ref 3.5–5.1)
Potassium: 5 mEq/L (ref 3.5–5.1)
Sodium: 135 mEq/L (ref 135–145)
Total Bilirubin: 0.4 mg/dL (ref 0.3–1.2)
Total Bilirubin: 0.8 mg/dL (ref 0.3–1.2)
Total Protein: 7 g/dL (ref 6.0–8.3)

## 2011-03-07 LAB — STOOL CULTURE

## 2011-03-07 LAB — LIPID PANEL
LDL Cholesterol: UNDETERMINED mg/dL (ref 0–99)
VLDL: UNDETERMINED mg/dL (ref 0–40)

## 2011-03-07 LAB — EHEC TOXIN BY EIA, STOOL

## 2011-03-07 LAB — TYPE AND SCREEN
ABO/RH(D): B POS
Antibody Screen: NEGATIVE

## 2011-03-07 LAB — LACTIC ACID, PLASMA: Lactic Acid, Venous: 1.8 mmol/L (ref 0.5–2.2)

## 2011-03-09 ENCOUNTER — Ambulatory Visit (HOSPITAL_COMMUNITY): Payer: Medicare Other

## 2011-03-09 NOTE — Letter (Signed)
Summary: medical release  medical release   Imported By: Lind Guest 03/05/2011 08:10:23  _____________________________________________________________________  External Attachment:    Type:   Image     Comment:   External Document

## 2011-03-18 NOTE — Assessment & Plan Note (Signed)
Summary: missed appt. 2.28.12 and rscheduled   Vital Signs:  Patient profile:   60 year old male Height:      74 inches Weight:      270.25 pounds BMI:     34.82 O2 Sat:      97 % on Room air Pulse rate:   98 / minute Pulse rhythm:   regular Resp:     16 per minute BP sitting:   128 / 52  (right arm)  Vitals Entered By: Adella Hare LPN (March 04, 2011 3:12 PM)  Nutrition Counseling: Patient's BMI is greater than 25 and therefore counseled on weight management options.  O2 Flow:  Room air CC: follow-up visit Is Patient Diabetic? Yes   CC:  follow-up visit.  Current Medications (verified): 1)  Omeprazole 20 Mg  Cpdr (Omeprazole) .Marland Kitchen.. 1 By Mouth Once Daily 2)  Baby Aspirin 81 Mg  Chew (Aspirin) .Marland Kitchen.. 1 By Mouth Once Daily 3)  Sensipar 30 Mg  Tabs (Cinacalcet Hcl) .Marland Kitchen.. 1 By Mouth Once Daily 4)  Senna Concentrate 8.6 Mg  Tabs (Sennosides) .... 2 By Mouth Two Times A Day 5)  Gabapentin 300 Mg  Caps (Gabapentin) .Marland Kitchen.. 1 By Mouth Two Times A Day 6)  Glipizide 10 Mg Tabs (Glipizide) .... Take 1 Tab Bid 7)  Allopurinol 100 Mg  Tabs (Allopurinol) .Marland Kitchen.. 1 By Mouth Once Daily 8)  Sea-Omega 300 Mg  Caps (Omega-3 Fatty Acids) .... 2 By Mouth Three Times A Day 9)  Gemfibrozil 600 Mg Tabs (Gemfibrozil) .... Take 1 Tab Two Times A Day 10)  Trental 400 Mg Cr-Tabs (Pentoxifylline) .... Take 1 Tab Three Times A Day 11)  Renvela 800 Mg Tabs (Sevelamer Carbonate) .... Take 5 Tabs Three Times A Day Before Meals 12)  Loratadine 10 Mg Tabs (Loratadine) .... Take 1 Tablet By Mouth Once A Day 13)  Nephro-Vite 0.8 Mg Tabs (B Complex-C-Folic Acid) .... One Tab By Mouth Qd 14)  Lancets and Strips For Precison Meter .... Two Times A Day Testing 15)  Losartan Potassium 100 Mg Tabs (Losartan Potassium) .... Take 1 Tablet By Mouth Once A Day 16)  Flonase 50 Mcg/act Susp (Fluticasone Propionate) .... One Puff Per Nosrtril Daily For Allergies 17)  Norvasc 10 Mg Tabs (Amlodipine Besylate) .... Take 1/2 Tab  Daily 18)  Humalog Mix 50/50 Kwikpen 50-50 % Susp (Insulin Lispro Prot & Lispro) .... 5 Units Can Titrate Up To 12 19)  Oxycodone-Acetaminophen 5-325 Mg Tabs (Oxycodone-Acetaminophen) .... Take Two Tablets By Mouth As Needed For Pain  Allergies (verified): 1)  Pcn  Review of Systems      See HPI GI:  Complains of abdominal pain; uncontrolled reflux symptoms wakes with food in his throat, also having solid dysphagia x 1 month. MS:  Complains of joint pain and stiffness; right hip pain has resolved since started walking sincce january.. Derm:  Complains of lesion(s); burning swelling on right side of neck x 2 days. Endo:  Denies cold intolerance, excessive hunger, excessive thirst, and excessive urination; twice daily testing mornings 120's post supper 118.  Physical Exam  General:  Well-developed,well-nourished,in no acute distress; alert,appropriate and cooperative throughout examination HEENT: No facial asymmetry, goiter EOMI, No sinus tenderness, TM's Clear, oropharynx  pink and moist.   Chest: Clear to auscultation bilaterally.  CVS: S1, S2, No murmurs, No S3.   Abd: Soft, Nontender.  MS: decreased  ROM spine, hips, shoulders and knees.  Ext: No edema.   CNS: CN 2-12 intact, power tone  and sensation normal throughout.   Skin: Intact, bilaterasl onychomycosis  Psych: Good eye contact, normal affect.  Memory intact, not anxious or depressed appearing.   Diabetes Management Exam:    Foot Exam (with socks and/or shoes not present):       Sensory-Monofilament:          Left foot: diminished    Eye Exam:       Eye Exam done elsewhere          Done by: vA   due in may   Impression & Recommendations:  Problem # 1:  GOITER, UNSPECIFIED (ICD-240.9) Assessment Comment Only  Future Orders: Radiology Referral (Radiology) ... 03/05/2011  Problem # 2:  DYSPHAGIA UNSPECIFIED (ICD-787.20) Assessment: Deteriorated  Orders: Gastroenterology Referral (GI)  Problem # 3:  HIP PAIN,  RIGHT (ICD-719.45) Assessment: Improved  The following medications were removed from the medication list:    Cyclobenzaprine Hcl 10 Mg Tabs (Cyclobenzaprine hcl) .Marland Kitchen... Take 1 tab by mouth at bedtime as needed fro right hip pain His updated medication list for this problem includes:    Baby Aspirin 81 Mg Chew (Aspirin) .Marland Kitchen... 1 by mouth once daily    Oxycodone-acetaminophen 5-325 Mg Tabs (Oxycodone-acetaminophen) .Marland Kitchen... Take two tablets by mouth as needed for pain  Problem # 4:  HYPERLIPIDEMIA (ICD-272.4) Assessment: Comment Only  His updated medication list for this problem includes:    Gemfibrozil 600 Mg Tabs (Gemfibrozil) .Marland Kitchen... Take 1 tab two times a day Low fat dietdiscussed and encouraged , labs are drawn at dialysis Labs Reviewed: SGOT: 14 (08/26/2010)   SGPT: 15 (08/26/2010)   HDL:27 (08/21/2009), 27 (08/21/2009)  LDL:154 (08/26/2010), 84 (16/09/9603)  Chol:186 (08/21/2009), 186 (08/21/2009)  Trig:373 (08/21/2009), 373 (08/21/2009)  Problem # 5:  DIABETES MELLITUS, TYPE II (ICD-250.00) Assessment: Comment Only  His updated medication list for this problem includes:    Baby Aspirin 81 Mg Chew (Aspirin) .Marland Kitchen... 1 by mouth once daily    Glipizide 10 Mg Tabs (Glipizide) .Marland Kitchen... Take 1 tab bid    Losartan Potassium 100 Mg Tabs (Losartan potassium) .Marland Kitchen... Take 1 tablet by mouth once a day    Humalog Mix 50/50 Kwikpen 50-50 % Susp (Insulin lispro prot & lispro) .Marland KitchenMarland KitchenMarland KitchenMarland Kitchen 5 units can titrate up to 12 followed by endo, due to recurrent hypoglycemic episodes Labs Reviewed: Creat: 10.52 (08/26/2010)    Reviewed HgBA1c results: 7.9 (01/21/2010)  Complete Medication List: 1)  Baby Aspirin 81 Mg Chew (Aspirin) .Marland Kitchen.. 1 by mouth once daily 2)  Sensipar 30 Mg Tabs (Cinacalcet hcl) .Marland Kitchen.. 1 by mouth once daily 3)  Senna Concentrate 8.6 Mg Tabs (Sennosides) .... 2 by mouth two times a day 4)  Gabapentin 300 Mg Caps (Gabapentin) .Marland Kitchen.. 1 by mouth two times a day 5)  Glipizide 10 Mg Tabs (Glipizide) .... Take  1 tab bid 6)  Allopurinol 100 Mg Tabs (Allopurinol) .Marland Kitchen.. 1 by mouth once daily 7)  Sea-omega 300 Mg Caps (Omega-3 fatty acids) .... 2 by mouth three times a day 8)  Gemfibrozil 600 Mg Tabs (Gemfibrozil) .... Take 1 tab two times a day 9)  Trental 400 Mg Cr-tabs (Pentoxifylline) .... Take 1 tab three times a day 10)  Renvela 800 Mg Tabs (Sevelamer carbonate) .... Take 5 tabs three times a day before meals 11)  Loratadine 10 Mg Tabs (Loratadine) .... Take 1 tablet by mouth once a day 12)  Nephro-vite 0.8 Mg Tabs (B complex-c-folic acid) .... One tab by mouth qd 13)  Lancets and Strips For Precison  Meter  .... Two times a day testing 14)  Losartan Potassium 100 Mg Tabs (Losartan potassium) .... Take 1 tablet by mouth once a day 15)  Flonase 50 Mcg/act Susp (Fluticasone propionate) .... One puff per nosrtril daily for allergies 16)  Norvasc 10 Mg Tabs (Amlodipine besylate) .... Take 1/2 tab daily 17)  Humalog Mix 50/50 Kwikpen 50-50 % Susp (Insulin lispro prot & lispro) .... 5 units can titrate up to 12 18)  Oxycodone-acetaminophen 5-325 Mg Tabs (Oxycodone-acetaminophen) .... Take two tablets by mouth as needed for pain 19)  Pantoprazole Sodium 40 Mg Tbec (Pantoprazole sodium) .... Take 1 tablet by mouth once a day , discontinue omeprazole effective 03/04/2011  Other Orders: Medicare Electronic Prescription [phone])  Patient Instructions: 1)  Please schedule a follow-up appointment in 4 months. 2)  It is important that you exercise regularly at least 30 minutes 5 times a week. If you develop chest pain, have severe difficulty breathing, or feel very tired , stop exercising immediately and seek medical attention. 3)  You need to lose weight. Consider a lower calorie diet and regular exercise.  4)  I am happy that yoour hip pain has resolved , and that walking healed the problem 5)  I think the swelling in your neck is your thyroid gland, I will get an ultrasound done. 6)  Your BP is great, no med  changes 7)  Pls keep your eye appt. 8)  You will start a new med for reflux, pls stop caffeine, and you will be refered to GI since you have probs swallowing solid foods Prescriptions: PANTOPRAZOLE SODIUM 40 MG TBEC (PANTOPRAZOLE SODIUM) Take 1 tablet by mouth once a day , discontinue omeprazole effective 03/04/2011  #30 x 3   Entered and Authorized by:   Syliva Overman MD   Signed by:   Syliva Overman MD on 03/04/2011   Method used:   Printed then faxed to ...       [facility] (retail)       [address]       [place], [place]  [address]       Ph: [phone]       Fax: [phone]   RxID:   [id number]    Orders Added: 1)  Est. Patient Level IV [03500] 2)  Medicare Electronic Prescription [G8553] 3)  Gastroenterology Referral [GI] 4)  Radiology Referral [Radiology]

## 2011-03-25 LAB — DIFFERENTIAL
Basophils Absolute: 0 10*3/uL (ref 0.0–0.1)
Basophils Absolute: 0 10*3/uL (ref 0.0–0.1)
Basophils Relative: 0 % (ref 0–1)
Eosinophils Absolute: 0 10*3/uL (ref 0.0–0.7)
Eosinophils Absolute: 0 10*3/uL (ref 0.0–0.7)
Eosinophils Relative: 0 % (ref 0–5)
Lymphocytes Relative: 12 % (ref 12–46)
Lymphocytes Relative: 16 % (ref 12–46)
Lymphocytes Relative: 20 % (ref 12–46)
Lymphs Abs: 1 10*3/uL (ref 0.7–4.0)
Lymphs Abs: 1 10*3/uL (ref 0.7–4.0)
Monocytes Absolute: 0.6 10*3/uL (ref 0.1–1.0)
Monocytes Relative: 15 % — ABNORMAL HIGH (ref 3–12)
Neutro Abs: 3.3 10*3/uL (ref 1.7–7.7)
Neutrophils Relative %: 65 % (ref 43–77)
Neutrophils Relative %: 73 % (ref 43–77)

## 2011-03-25 LAB — GLUCOSE, CAPILLARY
Glucose-Capillary: 111 mg/dL — ABNORMAL HIGH (ref 70–99)
Glucose-Capillary: 116 mg/dL — ABNORMAL HIGH (ref 70–99)
Glucose-Capillary: 132 mg/dL — ABNORMAL HIGH (ref 70–99)
Glucose-Capillary: 145 mg/dL — ABNORMAL HIGH (ref 70–99)
Glucose-Capillary: 150 mg/dL — ABNORMAL HIGH (ref 70–99)
Glucose-Capillary: 27 mg/dL — CL (ref 70–99)
Glucose-Capillary: 72 mg/dL (ref 70–99)
Glucose-Capillary: 86 mg/dL (ref 70–99)
Glucose-Capillary: 93 mg/dL (ref 70–99)
Glucose-Capillary: 94 mg/dL (ref 70–99)
Glucose-Capillary: 97 mg/dL (ref 70–99)
Glucose-Capillary: 97 mg/dL (ref 70–99)

## 2011-03-25 LAB — CBC
HCT: 37.5 % — ABNORMAL LOW (ref 39.0–52.0)
Hemoglobin: 12.8 g/dL — ABNORMAL LOW (ref 13.0–17.0)
MCHC: 34.1 g/dL (ref 30.0–36.0)
MCV: 95.9 fL (ref 78.0–100.0)
Platelets: 146 10*3/uL — ABNORMAL LOW (ref 150–400)
Platelets: 147 10*3/uL — ABNORMAL LOW (ref 150–400)
Platelets: 177 10*3/uL (ref 150–400)
RBC: 3.74 MIL/uL — ABNORMAL LOW (ref 4.22–5.81)
RDW: 14.6 % (ref 11.5–15.5)
RDW: 14.9 % (ref 11.5–15.5)
WBC: 5.1 10*3/uL (ref 4.0–10.5)
WBC: 6.1 10*3/uL (ref 4.0–10.5)

## 2011-03-25 LAB — HEMOGLOBIN A1C
Hgb A1c MFr Bld: 5.9 % (ref 4.6–6.1)
Mean Plasma Glucose: 123 mg/dL

## 2011-03-25 LAB — BASIC METABOLIC PANEL
BUN: 28 mg/dL — ABNORMAL HIGH (ref 6–23)
BUN: 35 mg/dL — ABNORMAL HIGH (ref 6–23)
BUN: 40 mg/dL — ABNORMAL HIGH (ref 6–23)
CO2: 26 mEq/L (ref 19–32)
Calcium: 9.1 mg/dL (ref 8.4–10.5)
Chloride: 99 mEq/L (ref 96–112)
Creatinine, Ser: 12.2 mg/dL — ABNORMAL HIGH (ref 0.4–1.5)
Creatinine, Ser: 9.47 mg/dL — ABNORMAL HIGH (ref 0.4–1.5)
GFR calc Af Amer: 7 mL/min — ABNORMAL LOW (ref >60)
GFR calc non Af Amer: 4 mL/min — ABNORMAL LOW (ref >60)
GFR calc non Af Amer: 5 mL/min — ABNORMAL LOW (ref >60)
GFR calc non Af Amer: 6 mL/min — ABNORMAL LOW (ref >60)
Glucose, Bld: 39 mg/dL — CL (ref 70–99)
Potassium: 4.6 mEq/L (ref 3.5–5.1)

## 2011-03-25 LAB — HEPATIC FUNCTION PANEL
ALT: 17 U/L (ref 0–53)
Albumin: 3.3 g/dL — ABNORMAL LOW (ref 3.5–5.2)
Alkaline Phosphatase: 53 U/L (ref 39–117)
Total Protein: 6.2 g/dL (ref 6.0–8.3)

## 2011-04-06 LAB — BASIC METABOLIC PANEL
BUN: 61 mg/dL — ABNORMAL HIGH (ref 6–23)
Chloride: 95 mEq/L — ABNORMAL LOW (ref 96–112)
Creatinine, Ser: 11.06 mg/dL — ABNORMAL HIGH (ref 0.4–1.5)
Creatinine, Ser: 14.17 mg/dL — ABNORMAL HIGH (ref 0.4–1.5)
GFR calc Af Amer: 5 mL/min — ABNORMAL LOW (ref >60)
GFR calc Af Amer: 6 mL/min — ABNORMAL LOW (ref >60)
GFR calc non Af Amer: 4 mL/min — ABNORMAL LOW (ref >60)
GFR calc non Af Amer: 4 mL/min — ABNORMAL LOW (ref >60)
Potassium: 3.6 mEq/L (ref 3.5–5.1)
Sodium: 132 mEq/L — ABNORMAL LOW (ref 135–145)

## 2011-04-06 LAB — CULTURE, BLOOD (ROUTINE X 2): Report Status: 2272010

## 2011-04-06 LAB — CBC
HCT: 36.6 % — ABNORMAL LOW (ref 39.0–52.0)
HCT: 37.8 % — ABNORMAL LOW (ref 39.0–52.0)
Hemoglobin: 12.3 g/dL — ABNORMAL LOW (ref 13.0–17.0)
Hemoglobin: 12.6 g/dL — ABNORMAL LOW (ref 13.0–17.0)
MCV: 92.6 fL (ref 78.0–100.0)
MCV: 93.4 fL (ref 78.0–100.0)
Platelets: 135 10*3/uL — ABNORMAL LOW (ref 150–400)
Platelets: 155 10*3/uL (ref 150–400)
Platelets: 156 10*3/uL (ref 150–400)
RBC: 3.95 MIL/uL — ABNORMAL LOW (ref 4.22–5.81)
RBC: 4.27 MIL/uL (ref 4.22–5.81)
WBC: 5.9 10*3/uL (ref 4.0–10.5)
WBC: 6.3 10*3/uL (ref 4.0–10.5)
WBC: 7 10*3/uL (ref 4.0–10.5)
WBC: 7.5 10*3/uL (ref 4.0–10.5)

## 2011-04-06 LAB — GLUCOSE, CAPILLARY
Glucose-Capillary: 115 mg/dL — ABNORMAL HIGH (ref 70–99)
Glucose-Capillary: 130 mg/dL — ABNORMAL HIGH (ref 70–99)
Glucose-Capillary: 171 mg/dL — ABNORMAL HIGH (ref 70–99)
Glucose-Capillary: 239 mg/dL — ABNORMAL HIGH (ref 70–99)
Glucose-Capillary: 241 mg/dL — ABNORMAL HIGH (ref 70–99)
Glucose-Capillary: 242 mg/dL — ABNORMAL HIGH (ref 70–99)
Glucose-Capillary: 292 mg/dL — ABNORMAL HIGH (ref 70–99)
Glucose-Capillary: 71 mg/dL (ref 70–99)

## 2011-04-06 LAB — DIFFERENTIAL
Eosinophils Absolute: 0 10*3/uL (ref 0.0–0.7)
Eosinophils Absolute: 0 10*3/uL (ref 0.0–0.7)
Eosinophils Absolute: 0 10*3/uL (ref 0.0–0.7)
Eosinophils Relative: 0 % (ref 0–5)
Eosinophils Relative: 0 % (ref 0–5)
Lymphocytes Relative: 13 % (ref 12–46)
Lymphocytes Relative: 22 % (ref 12–46)
Lymphocytes Relative: 8 % — ABNORMAL LOW (ref 12–46)
Lymphs Abs: 0.5 10*3/uL — ABNORMAL LOW (ref 0.7–4.0)
Lymphs Abs: 0.7 10*3/uL (ref 0.7–4.0)
Lymphs Abs: 1 10*3/uL (ref 0.7–4.0)
Lymphs Abs: 1.7 10*3/uL (ref 0.7–4.0)
Monocytes Absolute: 0.5 10*3/uL (ref 0.1–1.0)
Monocytes Absolute: 1.1 10*3/uL — ABNORMAL HIGH (ref 0.1–1.0)
Monocytes Relative: 17 % — ABNORMAL HIGH (ref 3–12)
Monocytes Relative: 7 % (ref 3–12)
Neutro Abs: 4 10*3/uL (ref 1.7–7.7)
Neutro Abs: 4.7 10*3/uL (ref 1.7–7.7)
Neutro Abs: 4.9 10*3/uL (ref 1.7–7.7)
Neutrophils Relative %: 62 % (ref 43–77)
Neutrophils Relative %: 69 % (ref 43–77)

## 2011-04-06 LAB — HEPATIC FUNCTION PANEL
AST: 17 U/L (ref 0–37)
Alkaline Phosphatase: 57 U/L (ref 39–117)
Bilirubin, Direct: 0.1 mg/dL (ref 0.0–0.3)
Total Bilirubin: 0.6 mg/dL (ref 0.3–1.2)

## 2011-04-13 ENCOUNTER — Telehealth: Payer: Self-pay | Admitting: Family Medicine

## 2011-04-20 ENCOUNTER — Encounter: Payer: Self-pay | Admitting: Cardiology

## 2011-04-20 ENCOUNTER — Ambulatory Visit: Payer: Medicare Other | Admitting: Cardiology

## 2011-05-04 NOTE — Cardiovascular Report (Signed)
NAME:  TYMEER, VAQUERA               ACCOUNT NO.:  [id number]   MEDICAL RECORD NO.:  [id number]          PATIENT TYPE:  INP   LOCATION:  3741                         FACILITY:  [facility]   PHYSICIAN:  Everardo Beals. Juanda Chance, MD, FACCDATE OF BIRTH:  1951/06/16   DATE OF PROCEDURE:  02/01/2008  DATE OF DISCHARGE:  02/02/2008                            CARDIAC CATHETERIZATION   CLINICAL HISTORY:  Mr. Baumgardner is 60 years old and has end-stage renal  disease and is on dialysis.  He also has diabetes, hypertension,  hyperlipidemia and obesity.  He recently was admitted to the hospital  with chest pain and hypotension.  He was thought to have unstable angina  and was referred here for further evaluation and angiography.   PROCEDURE:  The procedure was performed via the right femoral arterial  sheath and 5 French preformed coronary catheters.  A front wall arterial  puncture was performed and Omnipaque contrast was used.  The patient  tolerated the procedure well and left the laboratory in satisfactory  condition.   RESULTS:  The aortic pressure was 120/70.   The left main coronary artery, left main coronary was free of disease.   The left anterior descending artery gave rise to two diagonal branches  and three septal perforators.  These and the LA proper were free of  symptom disease.   The circumflex artery, the circumflex artery gave rise to a large  marginal branch and a small posterolateral branch.  These vessels were  free of significant disease.   The right carotid artery, the right carotid was a moderate-sized vessel  and gave rise to a three ventricle branches and a posterior descending  branch and a posterolateral branch.  These vessels were free of symptom  disease.   The left ventriculogram performed in RAO projection showed good wall  motion with no areas of hypokinesis.  The estimated ejection fraction  was 60%.   CONCLUSION:  Normal coronary angiography and left ventricular wall  motion.   RECOMMENDATIONS:  Reassurance.  Will get a D-dimer to screen for  pulmonary embolism.  If this is negative we will plan discharge tomorrow  morning if we can arrange followup dialysis in [facility] tomorrow.      Bruce Elvera Lennox Juanda Chance, MD, [facility]  Electronically Signed     BRB/MEDQ  D:  02/01/2008  T:  02/03/2008  Job:  [id number]   cc:   [name], MD, [facility]  Learta Codding, MD,FACC  [name], M.D.  Florene Glen, Dr

## 2011-05-04 NOTE — Discharge Summary (Signed)
NAME:  Albert Lewis, Albert Lewis               ACCOUNT NO.:  [id number]   MEDICAL RECORD NO.:  [id number]          PATIENT TYPE:  INP   LOCATION:  A223                          FACILITY:  [facility]   PHYSICIAN:  Dorris Singh, DO    DATE OF BIRTH:  1951/06/19   DATE OF ADMISSION:  04/19/2008  DATE OF DISCHARGE:  05/02/2009LH                               DISCHARGE SUMMARY   ADMISSION DIAGNOSES:  1. Gastrointestinal bleed with bright red blood per rectum.  2. End-stage renal disease with dialysis.  3. Diabetes mellitus.   DISCHARGE DIAGNOSES:  1. Gastrointestinal bleed, resolved.  2. End-stage renal disease with dialysis.  3. Diabetes mellitus.  4. Obesity.   CONSULTATIONS:  [name], M.D., of [facility].   RADIOLOGY TESTING:  On May 1 a CT of the abdomen and pelvis without  contrast.  His CT of his pelvis demonstrated polycystic kidney disease  with numerous probable high and low-attenuated cysts throughout the  kidneys as well as nonobstructing calcifications.  No acute upper  abnormalities.  Noted in his pelvis questionable inguinal hernias, no  acute intrapelvic abnormalities.   His H&P was done by Dr. Dorris Singh.  Please refer, but to  summarize, the patient is a 60 year old [place] American male who  presented with a 2-day history of commode full of blood.  Also he was  complaining of some abdominal pain but noticed it was better post  defecation.  He denied any hematuria or hematemesis but did notice  hematochezia.  He was admitted the service of [facility] and he was made  n.p.o.  His hemoglobin at the time of admission was 11.3.  He had also  had a colonoscopy about 15 months ago, was found to have diverticula.  At that point in time he was made n.p.o.  Dr. Kristian Covey was consulted, GI  was consulted as well, and serial H&H's were obtained.  There is an  issue about IV access for the patient so a PICC line was recommended as  well and  essentially if he continued to have rectal bleeding, IV access  was important.  On his second day of admission the patient was seen by  the PICC nurse.  Apparently due to her explanation of what a PICC line  entailed, he refused PICC line.  At that point in time we had no IV  access for the patient.  After he was seen by GI they recommended that  we increase his diet to see how he did as long as his abdominal pain  continued to decrease and he was able to eat without any problems and to  give him MiraLax and stool softeners, that if he continued to improve  without incident, he could be sent home.  The patient was able to eat  lunch and dinner without any problems.  It was recommended that due to  the fact that (1) we could not obtain IV access on him, that if anything  was to happen we had no means of treating him via IV medications and (2)  since he was improving and hydrating  and there was no need for IV  medications, that at this point in time as long as he continued to  improve he would be sent home and he did, so at dinnertime after the  patient stayed for an hour or so, it was determined he could be  discharged to home with proper follow-up with GI.  It was recommended  that he follow up with GI in the office and he was sent home on his home  medications, which include:   1. Allopurinol 100 mg once a day.  2. Aspirin 81 mg once a day.  3. Glipizide 5 mg two in the morning and  one at night.  4. Metoprolol tartrate 100 mg once a day.  5. Colace 100 mg twice a day.  6. Renagel 100 mg 5 tablets t.i.d.  7. Fish oil 1200 mg two tablets t.i.d.  8. Sensipar 30 mg once a day.  9. Valsartan oral 160 mg once a day.  10.Zocor 80 mg at bedtime.  11.Also he was recommended by GI to begin stool softeners and to      continue to increase his diet slowly.   CONDITION:  Stable.   DISPOSITION:  To home.      Dorris Singh, DO  Electronically Signed     CB/MEDQ  D:  05/08/2008  T:   05/08/2008  Job:  [id number]

## 2011-05-04 NOTE — Op Note (Signed)
NAME:  Albert Lewis, Albert Lewis               ACCOUNT NO.:  [id number]   MEDICAL RECORD NO.:  [id number]          PATIENT TYPE:  AMB   LOCATION:  DAY                           FACILITY:  [facility]   PHYSICIAN:  R. Roetta Sessions, M.D. DATE OF BIRTH:  April 02, 1951   DATE OF PROCEDURE:  05/21/2008  DATE OF DISCHARGE:                               OPERATIVE REPORT   Flexible sigmoidoscopy to 40 cm.   INDICATIONS FOR PROCEDURE:  A 60 year old gentleman was admitted in the  hospital about a month ago with low-volume hematochezia.  Hemoglobin was  stable at 11.2, just undergone a colonoscopy some 18 months earlier  without any evidence of hemorrhoids and left-sided diverticula and  hyperplastic rectal polyp.  He has not had any bleeding since this  episode a month ago.  Sigmoidoscopy is now being done to look at his  very distal lower GI tract.  This approach has been discussed with the  patient.  The risks, benefits, alternative, and specific limitations  have been reviewed.  Questions are answered and all parties are  agreeable.   PROCEDURE NOTE:  The patient was placed in the left lateral decubitus  position.  No IV conscious sedation was given.   INSTRUMENT:  Pentax video chip system.   FINDINGS:  Digital rectal exam revealed no abnormalities.   ENDOSCOPIC FINDINGS:  The prep was poor with large bowlers of stool in  the rectum and sigmoid, which limited the exam.  I was able to pass the  scope around these pieces of stool to 40 cm, advanced the scope in nice  one-to-one fashion.  The colonic mucosa seen to 40 cm appeared normal.  Scope was pulled down to the rectum where I was able to retroflex and  look at the distal rectum and some minimal hemorrhoids.  All mucosa of  the rectum and sigmoid was not seen because the poor prep.  However, I  did not see any abnormalities.  Certainly, there was no blood in the  lower GI tract.  The patient tolerated brief procedure well and was  discharged.   Grossly normal rectum and sigmoid colon to 40 cm, exam incomplete  because of poor prep.  I suspect trivial anorectal bleeding recently  given the recent colonoscopy findings.   RECOMMENDATIONS:  1. CBC today.  2. [name] will let me know if he has any future GI symptoms      including bleeding.      [name], M.D.  Electronically Signed     RMR/MEDQ  D:  05/21/2008  T:  05/22/2008  Job:  [id number]   cc:   Tesfaye D. Felecia Shelling, MD  Fax: [phone]

## 2011-05-04 NOTE — H&P (Signed)
NAME:  Albert Lewis, HAACK               ACCOUNT NO.:  [id number]   MEDICAL RECORD NO.:  [id number]          PATIENT TYPE:  INP   LOCATION:  IC06                          FACILITY:  [facility]   PHYSICIAN:  Dorris Singh, DO    DATE OF BIRTH:  12/04/1951   DATE OF ADMISSION:  02/07/2009  DATE OF DISCHARGE:  LH                              HISTORY & PHYSICAL   PRIMARY CARE DOCTOR:  Loney Laurence, M.D.   CHIEF COMPLAINT:  1. He was a transfer here from [place], his continuing condition on      his discharge summary for hypotension felt to be multifactorial in      nature, including volume contraction associated with beta-blockade,      possible underlying neuropathy, diabetes, and other factors.  2. End-stage renal disease.  3. Diabetes mellitus type 2, non-insulin dependent.  4. History of paroxysmal atrial fibrillation.  5. Gastroesophageal reflux.  6. Morbid obesity.  7. Obstructive sleep apnea.   HISTORY OF PRESENT ILLNESS:  The patient was admitted on February 07, 2009.  He is currently on hemodialysis which he has been on for many  years.  He is dialyzed frequently without a problem.  Remainder of the  day, he says he felt well.  On the day of admission, he felt weak  especially on standing.  He checked his blood pressure and the top  number was in the 70s, so he came to the emergency room for further  evaluation.  In the emergency room, he had been hypotensive with the  initial blood pressure of 58/44.  The decision was made to admit him  initially for observation.   PAST MEDICAL HISTORY:  Significant for:  1. Paroxysmal atrial fibrillation.  He sees Dr. Dietrich Pates.  He has a      history of cardiac cath 3 years ago.  2. End-stage renal disease due to polycystic kidney disease.  He has      been on hemodialysis since 2008.  3. Diabetes mellitus type 2, non-insulin dependent.  4. History of hypertension.  5. Dyslipidemia  6. Obstructive sleep apnea for which he is on CPAP.  7. Morbid obesity.  8. Gastroesophageal reflux.  9. Gout.   PAST SURGICAL HISTORY:  1. He had bilateral cataract surgeries.  2. Cholecystectomy.  3. Lumbar disc surgery.   SOCIAL HISTORY:  He is married and lives with his wife; is on  disability, quit smoking in 1995, does not use alcohol.   FAMILY HISTORY:  None for coronary artery disease.   MEDICATIONS WHEN HE CAME FROM [place]:  1. Aspirin 81 mg daily.  2. Midodrine 10 mg p.o. 3 times a day which is a new medication.  3. Omeprazole 20 mg daily.  4. Renvela 800 mg p.o. t.i.d.  5. Glipizide 10 mg twice daily.  6. Imodium which has been given for one episode of diarrhea.  Those are the medications that they have placed him on.  The family is  supposed to bring more medications for me to update.   ALLERGIES:  PENICILLIN.   REVIEW OF SYSTEMS:  Review of  the 10-point review of systems, his worse  complaint  still some imbalance and weakness.   PHYSICAL EXAMINATION:  GENERAL:  The patient is a 60 year old [place]  American male who is well developed, well nourished who has been  sleeping currently in his room.  VITAL SIGNS:  Blood pressure 129/61; pulse ox, he is sating at 86%.  HEENT:  Head is normocephalic and atraumatic.  Eyes are PERRLA.  We can  see bilateral implants are present.  Throat:  He is edentulous, it is  clear, no erythema.  NECK:  Thick.  I am not able to ascertain any JVD.  There is no bruit  heard.  LUNGS:  Clear to auscultation bilaterally with diminished breath sounds.  HEART:  Regular rate and rhythm.  There is no murmur or gallop.  ABDOMEN:  Obese, soft, nontender, nondistended.  EXTREMITIES:  Positive pulses.  There is +/-1 pitting edema bilaterally.  NEUROLOGIC:  Cranial nerves II through XII are grossly intact.   Currently here, we are obtaining labs from his transfer from [place],  so they are pending.   ASSESSMENT:  As stated, his discharge diagnoses. which include  hypotension, end-stage  renal disease; diabetes type 2, non-insulin  dependent; history of paroxysmal atrial fibrillation, gastroesophageal  reflux, morbid obesity, and obstructive sleep apnea.   PLAN:  Accept  transfer from [place], and if the patient  __________patient dialysis which he was scheduled to have today but did  not.  Due to the initial transfer also, we will go ahead and place him  on his medications as recommended by the [facility] doctor, and we will  also get a list of medications that he was on and also to avoid  any type of hypotensive medications.  Currently, the patient is on  Levophed drip.  We will have a central line placed due to poor venous  access.  He has been resting very comfortably.  He was also placed in  the ICU.  We will continue to monitor him and give him IV hydration  cautiously, and we will change therapy as necessary.      Dorris Singh, DO  Electronically Signed     CB/MEDQ  D:  02/08/2009  T:  02/08/2009  Job:  [id number]

## 2011-05-04 NOTE — Letter (Signed)
March 12, 2008    Tesfaye D. Felecia Shelling, MD  [address]  [place], [place] [address]   RE:  Albert Lewis, Albert Lewis  MRN:  [id number]  /  DOB:  25-Mar-1951   Dear Ninetta Lights:   Mr. Minerd returns to the office for continued assessment and treatment  of what was initially high blood pressure and more recently low blood  pressure.  Since his last visit, he has markedly improved.  Dizziness  has resolved.  His energy level has returned to normal.  He is  tolerating dialysis without difficulty.   He brings all his medications in with him.  His major pharmaceuticals  include:  1. Metoprolol 50 mg b.i.d.  2. Omeprazole 20 mg daily.  3. Allopurinol 100 mg daily.  4. Simvastatin 80 mg daily.  5. Aspirin 81 mg daily.  6. Glipizide 5 mg t.i.d.  7. Renagel 1600 mg t.i.d.  8. Gabapentin 300 mg b.i.d.  9. Cinacalcet 30 mg daily.  10.Hydrocodone 5/500 mg p.r.n.   PHYSICAL EXAMINATION:  A pleasant gentleman in no acute distress.  The  weight is 299, 1 pound more than in November.  Blood pressure 110/50,  heart rate 68 and regular, respirations 18.  NECK:  No jugular venous distention; no carotid bruits.  LUNGS:  Clear with somewhat coarse breath sounds at the bases.  CARDIAC:  Distant first and second heart sounds.  ABDOMEN:  Protuberant; otherwise benign.  EXTREMITIES:  1/2+ ankle edema.   IMPRESSION:  Mr. Hemp appears to be doing very well with his current  medical therapy.  I will leave further management to you and Dr.  Kristian Covey. Please call me at any time that I can assist in the care of  this very nice gentleman.    Sincerely,      [name], MD, [facility]  Electronically Signed    RMR/MedQ  DD: 03/12/2008  DT: 03/12/2008  Job #: [id number]   CC:    [name], M.D.

## 2011-05-04 NOTE — Discharge Summary (Signed)
NAME:  Albert, Lewis               ACCOUNT NO.:  [id number]   MEDICAL RECORD NO.:  [id number]          PATIENT TYPE:  INP   LOCATION:  3741                         FACILITY:  [facility]   PHYSICIAN:  Bettey Mare. Lawrence, NPDATE OF BIRTH:  Jun 02, 1951   DATE OF ADMISSION:  01/31/2008  DATE OF DISCHARGE:  02/02/2008                               DISCHARGE SUMMARY   PRIMARY CARDIOLOGIST:  Dr. Carmichael Bing in [place].   PRIMARY CARE PHYSICIAN:  Dr. Hanley Ben, [facility].   PROCEDURES PERFORMED:  Cardiac catheterization, per Dr. Charlies Constable  dated 02/01/2008, revealing normal coronary arteries and normal LV  function with an EF of 60%.   FINAL DISCHARGE DIAGNOSES:  1. Noncardiac chest pain.  2. End-stage renal disease with dialysis Monday, Wednesday, and      Friday.  3. Obstructive sleep apnea on CPAP.  4. Hypertension.  5. Dyslipidemia.  6. Morbid obesity.  7. Gastroesophageal reflux disease.  8. Gout.   HOSPITAL COURSE:  Albert Lewis is a 60 year old obese African-American  male with numerous cardiac risk factors but not documented coronary  artery disease who presented to the [facility] ER with complaint of  severe midsternal chest pain and was seen by Dr. Andee Lineman for further  evaluation.  The patient had prior history of chest discomfort and was  found to be atypical with negative troponin.  He also had a Cardiolite  stress test read by Dr. Diona Browner which was suggestive of inferior  ischemia with a calculated ejection fraction of 57%.  The patient was  advised at that time to proceed with cardiac catheterization, but he  declined to do so, and continued on medical regimen and followed up with  Dr. Dietrich Pates in [place].   The patient came to the emergency room at [facility] with complaints of  severe midsternal chest pain described as dull and associated with  diaphoresis, dyspnea, as well as some dizziness.  He also said that this  was the most severe episode he  had and most of the time it is induced by  walking or any exertion.  The patient was found to be hypotensive on  admission to [facility], and serial cardiac markers were found to be  mildly elevated in the total CPK with an MB of 6.4.  Troponin has been  negative: however, secondary to the patient's cardiovascular risk  factors and recurrent chest discomfort, it was advised by Dr. Andee Lineman  that the patient proceed with cardiac catheterization and transfer to  [facility] for procedure.   The patient was continued on heparin, as started at [facility], and did  undergo cardiac catheterization by Dr. Dickie La.  The catheterization was  found to be negative with normal coronary anatomy and normal LV function  with an EF of 60%.  The patient tolerated the procedure well.  There  were no further complaints of chest discomfort.  The patient was  discharged early on Friday secondary to a need to proceed with  outpatient dialysis, as inpatient dialysis could not be done secondary  to caseload and the patient was going to follow up in  [place] for  dialysis and then followup with Dr. Dietrich Pates in one month's time with no  medication changes.   LABS ON DISCHARGE:  Sodium 133, potassium 3.7, chloride 96, CO2 24, BUN  40, creatinine 10.8, glucose 82.  Hemoglobin 11.5, hematocrit 34.0,  white blood cells 5.6, platelets 134.  D-dimer 0.75.   Blood pressure at time of discharge 119/60. Heart rate 65.  Respirations  20.  Temperature 98.4. O2 sats 92% on room air.   EKG revealed normal sinus rhythm with a first-degree block with a PR  interval of .26.   DISCHARGE MEDICATIONS:  __________ 20 mg daily, glipizide 5-mg tablets 3  times a day, Zocor 80 mg at bedtime, gabapentin 300 mg twice a day,  allopurinol 100-mg tablet daily, criacatket 1 tablet daily, laxative and  stool softener of choice, Lasix 80 mg 1 tablet twice a day, Lopressor  100 mg twice a day, Nephrovite vitamin 1 tablet daily,  valsartan 160 mg  tablet daily.   ALLERGIES:  PENICILLIN.   FOLLOW UP:  1. The patient will follow up with Dr. Williams Bing on 02/28/2008,      at 11:30 a.m. for followup and cardiac management with seeing the      patient again at his discretion with normal coronary.  2. The patient will follow up with primary care physician, Dr. Birder Robson      at [facility].  Patient is to call for that appointment.  3. The patient is to continue post discharge outpatient dialysis, as      already previously scheduled to admission.  4. The patient has been given postcardiac catheterization instructions      with particular emphasis on the right groin site for evidence of      bleeding, hematoma, signs of infection, or severe pain.   Time spent with the patient to include physician time 30 minutes.      Bettey Mare. Lyman Bishop, NP     KML/MEDQ  D:  02/02/2008  T:  02/03/2008  Job:  [id number]

## 2011-05-04 NOTE — Letter (Signed)
February 09, 2008    Albert D. Felecia Shelling, MD  [address]  [place]  [place] [address]   Dear Dr. Felecia Lewis:   RE:  Albert Lewis, Albert Lewis  MRN:  [id number]  /  DOB:  1951/04/24   Albert Lewis returns to the office following a recent admission to [facility] and catheterization which showed normal coronaries and  normal left ventricular systolic function.  He was subsequently seen in  the emergency department yesterday and found to be hypotensive.  Lisinopril was held.  He remains weak and dizzy.   MEDICATIONS:  We cannot determine exactly what medications he is taking.   PHYSICAL EXAMINATION:  GENERAL:  On exam, a lethargic gentleman with  flat affect in no acute distress.  VITAL SIGNS:  The weight is 298, 12 pounds more than when we first saw  him in July of 2008.  Blood pressure 85/40, heart rate 72 and regular,  respirations 14.  NECK:  No jugular venous distention.  LUNGS:  Decreased breath sounds at the bases; otherwise clear.  CARDIOVASCULAR:  Normal first and second heart sounds; minimal systolic  ejection murmur.  ABDOMEN:  Soft and nontender; no organomegaly.  EXTREMITIES:  Dry skin; trace edema.   IMPRESSION:  Albert Lewis remains relatively hypotensive.  Valsartan will  be discontinue.  His dose of metoprolol will be reduced to 50 mg b.i.d.  If this is ineffective, consideration will have to  be given to removing less fluid at dialysis.  I will leave that to  further followup by Dr. Kristian Lewis.  I will see him again in 1 month to  reassess blood pressure and medications and to attempt to assist him  with his chronic chest pain that does not appear to be of cardiac  origin.    Sincerely,      [name], MD, [facility]  Electronically Signed    RMR/MedQ  DD: 02/09/2008  DT: 02/10/2008  Job #: [id number]   CC:    Albert Lewis, M.D.

## 2011-05-04 NOTE — Assessment & Plan Note (Signed)
[facility] HEALTHCARE                       [place] CARDIOLOGY OFFICE NOTE   BURLIE, CAJAMARCA                      MRN:          [id number]  DATE:04/10/2008                            DOB:          06-25-1951    Albert Lewis is seen to today by me as a new patient.  I have never seen  him before.  He has seen multiple doctors in our group before, including  Dr. Daleen Squibb, Dr. Andee Lineman and Dr. Dietrich Pates.   He has a history of end-stage renal disease on dialysis.  He has sleep  apnea but does not wear his CPAP.  He has relative hypotension on  dialysis with hyperlipidemia, central obesity, GERD and gout.   Currently on Friday he had tachycardia while on dialysis and was sent  [facility].  He said they gave him some medicines through the IV.  It does not  sound like he had PAF.  Unfortunately, I do not have any of these  records.  He was hospitalized overnight, apparently ruled out for  myocardial infarction and was sent home.  He can only take about 25 mg  of Toprol a day.  Otherwise, he gets hypotensive on dialysis.   He has not had any recurrence.  He has had some atypical chest pain.  He  was just catheterized by Dr. Juanda Chance on February 01, 2008, because of his  risk factors of diabetes, hypertension, hyperlipidemia and hypertension  on dialysis; however, his coronary arteries were totally normal and his  LV function was normal with an EF of 60%.   The patient's current chest pain is atypical.  It is sharp.  It is  reproducible by palpating his chest.  There is no associated  diaphoresis.   I believe he is short of breath due to mild chronic volume overload from  his renal failure, central obesity and sleep apnea.   He has been compliant with his medications.  They currently include:  1. Lopressor 25 mg a day.  2. Nephro-Vite.  3. Omeprazole 20 mg a day.  4. Allopurinol 100 mg a day.  5. Simvastatin 80 mg a day.  6. An aspirin a day.  7. Fish oil.  8.  Glipizide 5 mg.  9. Renagel.   Exam is remarkable for an overweight black male in no distress.  Blood pressure is 100/50, weight 296, pulse is 90.  He appears to be in  regular rhythm in sinus.  He has a large fistula in the left upper extremity and an occluded graft  in the left wrist.  LUNGS:  Clear.  Carotids were normal.  No lymphadenopathy, thyromegaly or JVP elevation.  There is an S1 and S2 with distant heart sounds.  PMI normal.  ABDOMEN:  Benign.  He has a midline scar, protuberant.  No AAA.  No  tenderness.  No hepatosplenomegaly.  No hepatojugular reflux.  Distal pulses are intact with trace edema.  NEUROLOGIC:  Nonfocal.  SKIN:  Warm and dry.  No muscular weakness.   IMPRESSION:  1. Atypical chest pain, recent catheterization at the end of February  with no angina.  No indication for further workup.  2. Relative hypotension.  The patient may need midodrine at 10 mg      b.i.d. or t.i.d. in the future.  For the time being we will      continue with dialysis and given dry weight.  3. Question recent tachycardia.  We will have to get his records from      the dialysis center in [place] and also a hospital note from      [place].  I am particularly interested to know if this was paroxysmal      atrial fibrillation.  He is not an ideal candidate for Coumadin.      We will continue an aspirin for the time being.  I will start him      on amiodarone 200 mg b.i.d. to make sure he does not have recurrent      tachypalpitations since he really cannot have a beta-blocker due to      his relative hypotension.  He will have PFTs, LFTs and a DLCO in      about 3 months.  4. Diabetes.  The patient checks his sugars three times a day.  He      will continue with his current dose of oral hypoglycemics  and      follow up with Dr. Felecia Shelling.   The patient has chronic follow-up with Dr. Kristian Covey for nephrology and I  will have him follow up with Dr. Dietrich Pates or Dr. Andee Lineman, who he has  seen  in the past.     Theron Arista C. Eden Emms, MD, [facility]  Electronically Signed    PCN/MedQ  DD: 04/10/2008  DT: 04/10/2008  Job #: [id number]

## 2011-05-04 NOTE — Discharge Summary (Signed)
NAME:  Albert Lewis, Albert Lewis               ACCOUNT NO.:  [id number]   MEDICAL RECORD NO.:  [id number]          PATIENT TYPE:  INP   LOCATION:  IC06                          FACILITY:  [facility]   PHYSICIAN:  Skeet Latch, DO    DATE OF BIRTH:  May 31, 1951   DATE OF ADMISSION:  02/07/2009  DATE OF DISCHARGE:  02/23/2010LH                               DISCHARGE SUMMARY   PRIMARY CARE PHYSICIAN:  Dr. [name].   DISCHARGE DIAGNOSES:  1. End-stage renal failure.  2. Hypotension, unknown etiology.  3. Paroxysmal atrial fibrillation.  4. History of type 2 diabetes.  5. History of morbid obesity.  6. History of obstructive sleep apnea.   BRIEF HOSPITAL COURSE:  This is a 60 year old African American male who  presented from [facility] after being transferred due to  persistent hypotension.  The patient presented to [facility] with  abdominal discomfort and some chest congestion.  The patient did not  receive dialysis secondary to [facility] unable to do dialysis.  The patient was transferred secondary to his hypotension.  The patient  was placed on midodrine at [facility] and the patient was to be  discharged as an outpatient, but the patient continued to be hypotensive  which required pressors and the patient was transferred to [facility].  The patient was transferred to [facility] still  hypotensive and was placed on Levophed.  The only complaint the patient  has been experiencing is chest congestion.  He has not been complaining  of light-headedness, dizziness, chest pain, or major abdominal pain.  The patient was placed on Levophed for 24 hours.  His Levophed was  turned off.  His IV fluids were also turned off.  The patient received  dialysis.  Levophed was restarted secondary to his hypotension of  unknown etiology.  The patient had blood cultures performed, but so far  unremarkable.  The patient continued to receive dialysis every  other  day.  For his chest congestion, chest x-ray was performed, which showed  no acute findings.  The patient was placed on Mucinex as well as IV  antibiotics.  The patient was placed on DVT as well as GI prophylaxis.  The patient's blood pressure started to elevate without pressors.  At  this time, we feel the patient may be discharged with followup with his  primary care physician and to receive his hemodialysis schedule.   DISCHARGE MEDICATIONS:  1. Aspirin 81 mg daily.  2. Omeprazole 20 mg daily.  3. Bellamor 800 mg 3 times a day.  4. Glipizide 10 mg twice a day.  5. Gemfibrozil 600 mg twice daily.  6. Toxophil 1400 mg 3 times a day.  7. Gabapentin 300 mg twice daily.  8. Nephro-Vite daily.  9. Allopurinol 100 mg daily.  10.Atrazine 100 mg daily.  11.Senna 30 mg daily.  12.Amiodarone 200 mg once a day.  13.Midodrine 5 mg 3 times daily.   CONDITION ON DISCHARGE:  Stable.  The patient discharged to home.   VITAL SIGNS:  Blood pressure 126/40, temperature 98.6, pulse 72,  respirations 14.  Saturating 95% on 3L.   DISCHARGE INSTRUCTIONS:  The patient to maintain a renal diet.  The  patient to increase activity to previous.  Follow up with Dr. Jen Mow in 1  to 2 days.  The patient to follow up with Dr. Kristian Covey at next scheduled  appointment.  The patient to return to his dialysis as scheduled.  The  patient to return to the emergency room if he has any chest pain, acute  shortness of breath, and/or call 9-1-1.      Skeet Latch, DO  Electronically Signed     SM/MEDQ  D:  02/11/2009  T:  02/11/2009  Job:  [id number]   cc:   [name], M.D.  Fax: [phone]   [name], M.D.

## 2011-05-04 NOTE — H&P (Signed)
NAME:  Albert Lewis, Albert Lewis               ACCOUNT NO.:  [id number]   MEDICAL RECORD NO.:  [id number]          PATIENT TYPE:  INP   LOCATION:  A223                          FACILITY:  [facility]   PHYSICIAN:  Dorris Singh, DO    DATE OF BIRTH:  September 07, 1951   DATE OF ADMISSION:  04/19/2008  DATE OF DISCHARGE:  LH                              HISTORY & PHYSICAL   CHIEF COMPLAINT:  Rectal bleeding.   The patient is a 60 year old African American male who presented to the  [facility] emergency room after going to dialysis this morning and  complaining of rectal bleeding.  He was then sent to be evaluated here.  While he was here he reports a history of abdominal pain as well and it  has been rated 6/10 at its worst, and he also has noticed some blood in  his stool with bowel movements.  He is scheduled to have dialysis on  Monday, Wednesday and Friday.   PAST MEDICAL HISTORY:  1. Significant for anemia.  2. Diabetes type 2.  3. End-stage renal disease with dialysis.  4. GERD.  5. Gout.  6. Iron-deficiency anemia.  7. Hyperparathyroidism.   FAMILY HISTORY:  Significant for CAD and hypertension.   SURGICAL HISTORY:  Significant for back pain, cholecystectomy and an HD  shunt to upper left arm.   SOCIAL HISTORY:  He is a nonsmoker, nondrinker.  No drug abuse.  Lives  with spouse.   ALLERGIES:  PENICILLIN with unknown reaction.   He is on a lot of medications:  1. Allopurinol 100 mg once a day.  2. Aspirin 81 mg once a day.  3. Glipizide 5 mg two in the morning and one at night.  4. Metoprolol tartrate 100 mg once a day.  5. Colace 100 mg twice a day.  6. Renagel 100 mg 5 tablets t.i.d.  7. Fish oil 1200 mg 2 capsules t.i.d.  8. Sensipar 30 mg once a day.  9. Valsartan oral 160 mg once a day.  10.Zocor 80 mg at bedtime.   REVIEW OF SYSTEMS:  CONSTITUTIONAL:  Negative for weakness or changes in  appetite, fever or chills.  EYES:  Negative for changes in vision.  EARS, NOSE, MOUTH,  THROAT:  Negative for hearing changes, smelling  changes or sore throat.  CARDIOVASCULAR:  Negative for palpitations or  chest pain.  RESPIRATORY:  Negative for dyspnea or shortness of breath.  GASTROINTESTINAL:  Positive for abdominal pain and bright red blood per  rectum.  GU: Negative for dysuria or hesitancy.  MUSCULOSKELETAL:  Negative for arthralgias or arthritis.  SKIN:  Negative for any pruritus  or lesion.  ALLERGIC:  negative for any pruritus or hives.  PSYCHIATRIC:  Negative for any depression.   His vitals are as follows:  Temperature 98.5, pulse 88, respirations 20,  blood pressure 120/74.  GENERAL:  The patient is a 60 year old African American male who is  obese, well-developed, well-nourished, in no acute distress.  HEENT:  Eyes are PERRLA.  EOMI.  Nose:  Turbinates are moist.  Throat:  No exudate or erythema noted.  NECK:  Thick, supple.  No lymphadenopathy noted.  HEART:  Regular rate and rhythm.  LUNGS:  Clear to auscultation bilaterally.  ABDOMEN:  Soft with some mild diffuse tenderness, no organomegaly noted,  and bowel sounds in all quadrants.  EXTREMITIES:  Positive pulses.   His testing that was done includes:  Today on May 1 he had a CT of the  abdomen and pelvis.  CT shows polycystic kidney disease with numerous  probable high and low attenuation throughout kidneys as well as  nonobstructing calcifications.  No acute upper abnormalities noted.  In  the pelvis, questionable inguinal hernia, no acute intrapelvic  abnormalities.   LABS:  White count of 7.5, hemoglobin 11.6, hematocrit 33.4, platelet  count of 147.  INR of 1.1.  Sodium 136, potassium 4.0, chloride 96, CO2  21, glucose 233, BUN 51, creatinine 12.32.  His LFTs are within normal  limits.   ASSESSMENT/PLAN:  1. Gastrointestinal bleed, bright red blood per rectum.  2. End-stage renal disease with dialysis.  3. Diabetes mellitus.   PLAN:  1. We will admit the patient to the service of  [facility].  We will      consult Dr. Kristian Covey and [facility].  2. We will make the patient n.p.o. until he can be seen by GI.  3. We will do IV hydration cautiously due to the patient being on      dialysis.  4. We will do DVT and GI prophylaxis.  5. We will place the patient on all home medications.  6. We will do serial H&H.  7. There is an issue with getting IV access.  We will be see if that      can be obtained.  8. We will continue to monitor the patient and change therapy as      needed.  9. Also, we will type and screen the patient if need be.      Dorris Singh, DO  Electronically Signed     CB/MEDQ  D:  04/19/2008  T:  04/19/2008  Job:  [id number]

## 2011-05-04 NOTE — Consult Note (Signed)
NAME:  NYLEN, CREQUE               ACCOUNT NO.:  [id number]   MEDICAL RECORD NO.:  [id number]          PATIENT TYPE:  INP   LOCATION:  A223                          FACILITY:  [facility]   PHYSICIAN:  [name], M.D.DATE OF BIRTH:  [birth year]   DATE OF CONSULTATION:  DATE OF DISCHARGE:                                 CONSULTATION   REASON FOR CONSULT:  End-stage renal disease, for dialysis.   HISTORY OF PRESENT ILLNESS:  Mr. Kromer is a 60 year old African  American with past medical history of hypertension, history of end-stage  renal disease on maintenance hemodialysis, history of diabetes,  who  presently came with the complaints of bloody stool for the last couple  of days.  According to the patient, he has  had some bleeding during his  bowel movement; most of the time, once or twice a day.  This started  about 3 days ago.  He does not have any previous history of GI bleeding  and no history of peptic ulcer disease, except the history of GERD.  His  last colonoscopy was about 3 years ago, and during that time, he states  that there was no other findings.   PAST MEDICAL HISTORY:  1. He has history of end-stage renal disease, dialysis on Monday,      Wednesday, dialysis on Friday.  The last dialysis was on Wednesday.  2. He has a history of sleep apnea, on CPAP.  3. History of hypertension.  4. History of dyslipidemia.  5. History of GERD.  6. History of morbid obesity.  7. History of gout.   MEDICATIONS:  1. Glipizide 5 mg p.o. b.i.d.  2. Gabapentin 300 mg p.o. twice a day.  3. He is also on allopurinol 100 mg p.o. daily.  4. Nephro-Vite 1 tablet p.o. daily.  5. Lopressor 100 mg p.o. b.i.d.  6. Valsartan 160 mg p.o. daily.  7. Zocor 80 mg p.o. daily.   SOCIAL HISTORY:  No history of smoking.  No history of alcohol abuse.   ALLERGIES:  PENICILLIN.   FAMILY HISTORY:  No history of renal failure.   REVIEW OF SYSTEMS:  He does not have fever, chills, or sweating.   No  nausea and no vomiting.  He has some weakness.  He has blood in the  stool.  He does  not have any abdominal pain.  He denies any diarrhea.  He denies any urgency or frequency.   PHYSICAL EXAMINATION:  GENERAL:  The patient is alert in no apparent  distress.  VITAL SIGNS:  His blood pressure is 135/71, heart rate 82, and afebrile.  CHEST:  Clear to auscultation.  No rales, no rhonchi, and no egophony.  HEART:  Regular rate and rhythm.  No murmur.  ABDOMEN:  Soft.  Positive bowel sound.  EXTREMITIES:  No edema.   BLOOD WORK:  His white blood cell count is 7.5.  Hemoglobin is 11.7 and  hematocrit 34.1.  His sodium is 136, potassium 4, BUN is 51, and  creatinine 12.3.  Albumin of 3.1.   ASSESSMENT:  1. End-stage.  He is  on hemodialysis, Monday, Wednesday, and Friday.      BUN and creatinine within acceptable range.  Normal potassium.  No      sign of fluid overload.  2. Anemia, mild.  He has gastrointestinal bleeding.  At this moment,      the source is not clear, since he has probably a fresh blood, it      could be secondary to hemorrhoids.  However, other etiologies at      this moment cannot be ruled out.  3. History of hypertension.  Blood pressure seems to be reasonably      controlled.  4. History of sleep apnea, on CPAP.  5. History of chest pain.  He is status post cardiac catheterization.      No significant stenosis.  Ejection fraction was normal.  6. History of gout.  He is on allopurinol.  He is asymptomatic.  7. History of diabetes.  He is on hypoglycemic agent.  8. History of diabetic neuropathy.  He is on Neurontin.  9. History of hypercholesterolemia.  He is on Zocor.  10.History of polycystic kidney disease.  11.History of gastroesophageal reflux disease.   RECOMMENDATIONS:  We will make arrangement for him to get dialysis  today.  We are not going to use any heparin.  We will try to get  probably about 2 L of fluid and we will continue with other treatment   and we will follow the patient and we will continue his other  medications.      [name], M.D.  Electronically Signed     BB/MEDQ  D:  04/19/2008  T:  04/20/2008  Job:  [id number]

## 2011-05-04 NOTE — Letter (Signed)
July 11, 2008    Tesfaye D. Felecia Shelling, MD  [address]  [place], [place] [address]   RE:  PRABHJOT, PISCITELLO  MRN:  [id number]  /  DOB:  1951/07/19   Dear Ninetta Lights,   Mr. Tesoriero returns to the office after having been evaluated by Dr.  Eden Emms a few months ago.  We had information that he had been admitted  to [facility] overnight for a tachyarrhythmia, but no specific records  were obtained.  Mr. Kwong list of medications at that time did not  include amiodarone, but she is currently is taking 200 mg b.i.d.  I  suspect that he was taking that medication at that time as well.  He  reports doing generally well.  He has no orthopnea, PND, or dyspnea on  exertion except when it proves impossible to remove adequate fluid at  dialysis.  Since starting metoprolol, he has had some blood pressure  issues during those sessions.   We have been able to obtain fragmentary records from April 2009.  I have  an EKG showing atrial fibrillation with rapid ventricular response.  He  had an echocardiogram and a stress test at that time that were  unremarkable.   PHYSICAL EXAMINATION:  GENERAL:  A pleasant overweight gentleman in no  acute distress.  VITAL SIGNS:  The weight is 305, 9 pounds more than in April 2009.  Blood pressure 125/65, heart rate 85 and regular, and respirations 14.  NECK:  No jugular venous distention; normal carotid upstrokes without  bruits.  LUNGS:  Clear.  CARDIAC:  Normal first and second heart sounds; fourth heart sound  present.  ABDOMEN:  Soft and nontender; no organomegaly; multiple scars.  EXTREMITIES:  Ankle edema 1/2+.   Rhythm strip:  Sinus rhythm with first-degree AV block.   Laboratory studies are available from June and July 2009.  CBC was  normal.  Hepatic profile was normal.  TSH was 1.3 and T4 was normal as  well.   IMPRESSION:  Mr. Laskaris has paroxysmal atrial fibrillation and is  currently doing well on amiodarone.  His dose will be decreased to  200  mg daily.  We will obtain appropriate monitoring laboratories.  I am not  certain that the best course is to leave him on this medicine long term,  but WE will continue it for the time being.  He is not anticoagulated,  but currently has no thromboembolic risk.   He is not able to obtain adequate dialysis due to hypotension.  The  metoprolol might prevent a rapid response in atrial fibrillation, but is  superfluous at the present time and will be discontinued.  He will  monitor blood pressure.  I will see this nice gentleman again in 3  months.  We will attempt to obtain complete records of his treatment at  [facility] in the interim.    Sincerely,      [name], MD, [facility]  Electronically Signed    RMR/MedQ  DD: 07/11/2008  DT: 07/12/2008  Job #: [id number]   CC:    [name], M.D.

## 2011-05-04 NOTE — Letter (Signed)
July 11, 2008    Tesfaye D. Felecia Shelling, MD  [address]  [place]  [place] [address]   RE:  Albert Lewis, Albert Lewis  MRN:  [id number]  /  DOB:  01-24-1951   Dear Ninetta Lights:   [name] returns to the office after having seen my partner, Dr.  Eden Emms, 2 months ago.  He had been admitted to [facility] with  tachycardia.  No records were available.  No change in [name]'s  therapy was recommended.  Nonetheless, he has done well since then.  He  is unaware of any heart rhythm disturbance.  He has good exercise  tolerance and no chest discomfort.  He has problems with hypotension on  dialysis days.  For the past few days he has held metoprolol with  improvement in his dialysis sessions but no hypertension.   OTHER NOTABLE MEDICINES:  1. Omeprazole 20 mg daily.  2. Allopurinol 100 mg daily.  3. Simvastatin 80 mg daily.  4. Aspirin 81 mg daily.  5. Fish oil glipizide 5 mg t.i.d.  6. Renagel t.i.d.  7. Amiodarone 200 mg b.i.d.  8. NTG 0.4 mg SL p.r.n.  9. Gabapentin 300 mg b.i.d.  10.Cinacalcet 30 mg daily.  11.Hydrocodone 5/500 mg p.r.n.  12.Cetirizine 10 mg p.r.n.   Rhythm strip:   INCOMPLETE DICTATION    Sincerely,      [name], MD, [facility]    RMR/MedQ  DD: 07/11/2008  DT: 07/12/2008  Job #: [id number]

## 2011-05-04 NOTE — Letter (Signed)
October 10, 2008    Albert D. Felecia Shelling, MD  [address]  [place], [place] [address]   RE:  Albert Lewis, Albert Lewis  MRN:  [id number]  /  DOB:  [birth year]   Dear Ninetta Lights,   [name] returns to the office for continued assessment and treatment  of paroxysmal atrial fibrillation.  Prior records were obtained for  [facility].  He was admitted with AF with a rapid ventricular  response.  Rate was controlled with diltiazem.  He was started on  amiodarone, but probably converted spontaneously to sinus rhythm.  Metoprolol was substituted for diltiazem at the time of discharge and  then discontinued at his last office visit.  He has done well since  then.  He has some exertional palpitations and dyspnea, but no other  cardiopulmonary symptoms.  He has tolerated dialysis well without  hypotension.   Medications are unchanged from his last visit except for discontinuation  of metoprolol.  He also is no longer taking simvastatin.   PHYSICAL EXAMINATION:  GENERAL:  Overweight pleasant gentleman.  VITAL SIGNS:  The weight is 302, 5 pounds less than in July, blood  pressure 120/65, heart rate 85 and regular, respirations 14.  NECK:  No  jugular venous distention; no carotid bruits.  LUNGS:  Clear.  CARDIAC:  Normal first and second heart sounds.  ABDOMEN:  Soft and nontender.  EXTREMITIES:  Trace edema.   IMPRESSION:  [name] is doing generally well.  An EKG is pending.  We  will obtain a chest x-ray and, chemistry profile, and TSH to monitor  amiodarone therapy.  I will see this nice gentleman again in 6 months.  I do not feel that he needs anticoagulation with warfarin, and he has a  relative contraindication in terms of a recent lower gastrointestinal  bleed.    Sincerely,      [name], MD, [facility]  Electronically Signed    RMR/MedQ  DD: 10/10/2008  DT: 10/10/2008  Job #: [id number]   CC:    [name], M.D.

## 2011-05-04 NOTE — Consult Note (Signed)
NAME:  Albert Lewis, Albert Lewis               ACCOUNT NO.:  [id number]   MEDICAL RECORD NO.:  [id number]          PATIENT TYPE:  INP   LOCATION:  A223                          FACILITY:  [facility]   PHYSICIAN:  R. Roetta Sessions, M.D. DATE OF BIRTH:  Apr 28, 1951   DATE OF CONSULTATION:  DATE OF DISCHARGE:                                 CONSULTATION   PRIMARY CARE PHYSICIAN:  [facility].   REASON FOR CONSULTATION:  Abdominal pain, hematochezia.   HISTORY OF PRESENT ILLNESS:  Albert Lewis is a 60 year old African  American male who reports approximately 2 days ago he went to have a  bowel movement and noticed that the commode was full of blood.  He  describes the blood as bright red.  It was with the bowel movement.  He  has had 3 episodes total, 1 per day since Wednesday.  He complains of a  cramp-like lower abdominal pain which is intermittent.  It is  occasionally dull, occasionally sharp, 6/10 at worst.  Worse with a  bowel movement, somewhat better post defecation.  He does note  proctalgia.  He denies any fever.  Has had some chills.  Denies nausea,  vomiting, anorexia, early satiety.  Denies any fatigue.  He does have  history of chronic anemia which was diagnosed about 2 years ago.  Has  previously been on iron, but this was discontinued last year.  He is on  dialysis.  His bleeding did began after dialysis.  He has been  constipated.  Has gone up to 4 days without a bowel movement over the  last 6 months.  He is on aspirin 81 mg daily but denies any other NSAID  use.  Denies any heartburn, ingestion, dysphagia or odynophagia.  He has  been started on a new bowel medication, but he does not know the name.  He has taken stool softeners previously which did not seem to help for  his constipation.  He has not tried fiber.  CT of the abdomen and pelvis  without contrast today showed polycystic kidneys with numerous high and  low attenuation cysts throughout the kidneys and nonobstructed  calcifications and bilateral inguinal hernias.   PAST MEDICAL AND SURGICAL HISTORY:  1. He had a colonoscopy on January 09, 2007 by Dr. Jena Gauss which showed      mid to rectal polyps and left-sided diverticula which were      hyperplastic.  2. He has history of end-stage renal failure on dialysis for the last      year on Monday, Wednesday, and Friday.  3. He is status post cholecystectomy.  4. He had a herniated disk and is status post lumbar sacral surgery      years ago.  5. He has history of sleep apnea on CPAP.  6. Hypertension.  7. Hyperlipidemia.  8. Obesity.  9. Chronic GERD.  10.Gout.  11.Diabetes mellitus.  12.Laparoscopic abdominal surgery after back surgery.  He tells me      years ago he had a bowel perforation which required further      surgery.   MEDICATIONS PRIOR  TO ADMISSION:  The patient did not bring his  medications with him, but we believe he is on:  1. Allopurinol 100 mg daily.  2. Aspirin 81 mg daily.  3. Glipizide 5 mg 2 in the morning and 1 at night.  4. Metoprolol 100 mg daily.  5. Colace 100 mg b.i.d.  6. Renagel 500 mg t.i.d.  7. Fish oil 1200 mg 2 p.o. t.i.d.  8. Sensipar 30 mg daily.  9. Valsartan 160 mg daily.  10.Zocor 80 mg q.h.s.   ALLERGIES:  PENICILLIN CAUSED TROUBLE WALKING.   FAMILY HISTORY:  There is no known family history of colorectal  carcinoma, liver, or chronic GI problems.   SOCIAL HISTORY:  Mr. Kerins is married.  He has 2 grown children who are  relatively healthy.  He is retired from [employer].  He is disabled.  He has a 30+-pack-year history of tobacco abuse.  Denies any alcohol or  drug use.   REVIEW OF SYSTEMS:  GU:  He has decreased urinary frequency since he  began dialysis.  He denies any hematuria or dysuria, otherwise negative.  See HPI.   PHYSICAL EXAMINATION:  VITAL SIGNS:  Weight 136.532 kg, 74 inches.  Temperature 98.5, blood pressure 120/74, pulse 88, respirations 20, O2  saturation 98% on room air.   GENERAL:  Albert Lewis is a morbidly obese African American male who is  alert, oriented, pleasant, cooperative, and in no acute distress.  HEENT:  Sclerae clear, nonicteric.  Conjunctivae pink.  Oropharynx pink  and moist without lesions.  NECK:  Supple without mass or thyromegaly.  HEART:  Regular rate and rhythm.  Normal S1-S2 without murmurs, clicks,  rubs or gallops.  LUNGS:  Clear to auscultation bilaterally.  ABDOMEN:  Positive bowel sounds x4.  No bruits auscultated.  Soft,  nontender, nondistended without palpable mass or hepatosplenomegaly.  No  rebound tenderness or guarding.  RECTAL:  There are no external lesions visualized.  He has palpable  internal hemorrhoids.  No significant internal masses palpated.  Upon  withdrawal of the glove, he has pinkish red blood noted on the glove.  He was quite tender on exam.  EXTREMITIES:  With 1+ lower extremity edema bilaterally.   LABORATORY DATA:  WBC 7.5, hemoglobin 11.6, hematocrit 33.4, platelets  147, reticulocyte 4.2, RBC 3.65.  INR 1, calcium 8.47.  Sodium 136,  potassium 4, glucose 233, creatinine 51, BUN 12.32, chloride 96, CO2 21.  B12 429.  Ferritin 291.  Total bilirubin 0.3, alkaline phosphatase 80,  AST 25, ALT 30, total protein 6.3, and albumin was 3.1.   IMPRESSION:  Albert Lewis is a 60 year old male with constipation and 3  episodes of bright red hematochezia with proctalgia and low abdominal  cramp-like pain.  Abdominal and pelvic CT was benign, noncontrast.  Rectal exam shows internal hemorrhoids with pinkish red blood per rectum  and significant tenderness.  Hemoglobin 11.6.  His last colonoscopy by  Dr. Jena Gauss was about 15 months ago with diverticula, otherwise normal.  He has does have history of chronic constipation.  I suspect his rectal  bleeding is secondary to hemorrhoids or diverticula or less likely would  be ischemia, given recent colonoscopy.   PLAN:  1. Agree with clear liquids.  2. Anusol HC  suppositories  3. Monitor H&H, and if he shows a drop in hemoglobin, he will need a      flexible sigmoidoscopy versus colonoscopy.  Otherwise, there is no      acute indication.  4. Would treat his constipation with MiraLax and stool softeners.  5. Will monitor his abdominal pain and provide supportive measures,      including pain control.  6. Will follow with you.   Thank you for allowing [place] to participate in the care of Mr. Walt.      Lorenza Burton, N.P.      [name], M.D.  Electronically Signed    KJ/MEDQ  D:  04/19/2008  T:  04/19/2008  Job:  [id number]   cc:   [facility]

## 2011-05-04 NOTE — Letter (Signed)
July 20, 2007    Tesfaye D. Felecia Shelling, MD  [address]  [place],  [place] [address]   RE:  Albert Lewis, Albert Lewis  MRN:  [id number]  /  DOB:  1951/10/20   Dear Dr.  Felecia Shelling:   [name] returns to the office following a recent admission to  [facility] for chest pain. An echocardiogram was planned, but not  performed. He did undergo a stress nuclear study that showed inferior  ischemia. Coronary angiography was recommended, but refused by the  patient. He elected medical therapy. Metoprolol 100 mg b.i.d. was added  to his medical regimen. Since then, he has felt fine.   On examination, pleasant overweight gentleman in no acute distress. The  weight is 286, blood pressure 135/65, heart rate 80 and regular,  respirations 18.  NECK: No jugular venous distention.  LUNGS:  Clear.  CARDIAC: Normal 1st and 2nd heart sounds.  ABDOMEN: Soft and nontender; no organomegaly.  SKIN: Non-pigmented nevus over the right wrist.  EXTREMITIES: Fistula in the left upper arm. Decreased pulses in the left  lower extremity.   EKG: Normal sinus rhythm; first-degree AV block; nonspecific T-wave  abnormality; no prior tracing for comparison.   IMPRESSION:  [name] is doing generally well with medical therapy. I  will review his stress test report and obtain an echocardiogram and a  lipid profile. I will plan to reassess this nice gentleman in one month.  He is instructed to call for recurring chest discomfort.    Sincerely,      [name], MD, [facility]  Electronically Signed    RMR/MedQ  DD: 07/20/2007  DT: 07/20/2007  Job #: [id number]   CC:    [name], M.D.

## 2011-05-04 NOTE — Consult Note (Signed)
NAME:  Albert Lewis, Albert Lewis               ACCOUNT NO.:  [id number]   MEDICAL RECORD NO.:  [id number]          PATIENT TYPE:  INP   LOCATION:  IC06                          FACILITY:  [facility]   PHYSICIAN:  [name], MD, FACCDATE OF BIRTH:  07-03-1951   DATE OF CONSULTATION:  02/10/2009  DATE OF DISCHARGE:  02/11/2009                                 CONSULTATION   PRIMARY CARE PHYSICIAN:  1. [name], M.D.  2. He also sees a physician at the [facility].   CARDIOLOGIST:  [name], MD.   REASON FOR CONSULTATION:  Hypotension.   HISTORY OF PRESENT ILLNESS:  Albert Lewis is a 60 year old male with a  history of end-stage renal disease on hemodialysis.  He underwent  cardiac catheterization February 2009 that demonstrated no coronary  artery disease.  He has been seen in our office in the past for  hypotension.  He was taken off of his medicines for hypertension in the  recent past with improvement in his blood pressure.  However, he did  continue to note problems with hypotension especially around the time of  his dialysis.  He saw Dr. Dietrich Pates in July 2009 with reported history of  paroxysmal atrial fibrillation.  He was placed on amiodarone at that  time and has maintained normal sinus rhythm at his last office visit in  October 2009.  He recently presented to [facility] in [place] with  complaints of weakness.  His blood pressure was noted to be quite low  (reportedly 58/44).  He was placed on Levophed and eventually  transferred to [facility] secondary to lack of dialysis  services at [facility].  Midodrine has been added to his medical  regimen.  He is now off Levophed with stable blood pressures.   PAST MEDICAL HISTORY:  As outlined above.  1. Of note prior to his heart catheterization in February 2009, his      Cardiolite demonstrated inferior ischemia and this was performed at      [facility].  His EF is reported to  be normal at 60%.  2. Paroxysmal atrial fibrillation.  He has not been placed on Coumadin      in the past secondary to history of lower GI bleeding.  3. End-stage renal disease on hemodialysis.  4. Diabetes mellitus.  5. Dyslipidemia.  6. Hypertension.  7. Sleep apnea on CPAP.  8. Obesity.  9. GERD.  10.Gout.  11.History of bright red blood per rectum in June 2009 - colonoscopy      at that time without significant abnormalities.  12.Status post cholecystectomy.  13.Status post laparoscopic abdominal surgery.  14.History of back surgery.   ALLERGIES:  PENICILLIN.   HOME MEDICATIONS:  1. Aspirin 81 mg daily.  2. Omeprazole 20 mg daily.  3. Renvela 800 mg 3 times a day.  4. Glipizide 10 mg b.i.d.  5. Gemfibrozil 600 mg b.i.d.  6. Sevelamer 800 mg 3 times a day.  7. Pentoxifylline 400 mg 3 times a day.  8. Gabapentin 300 mg b.i.d.  9. Nephrovite daily.  10.Allopurinol 100  mg daily.  11.Cetirizine cinacalcet 30 mg daily.  12.Amiodarone 200 mg b.i.d.   SOCIAL HISTORY:  Patient has a 20+ pack year history of smoking and quit  several years ago.  Denies alcohol abuse.   FAMILY HISTORY:  Insignificant for CAD.   REVIEW OF SYSTEMS:  Please see HPI.  Denies fevers.  Does have  headaches.  Denies bright red blood per rectum or melena.  He is anuric  with his end-stage renal disease.  Denies orthopnea or PND.  He does  have pedal edema.  He admits to a nonproductive cough.  Rest of the  review of systems is negative.   PHYSICAL EXAMINATION:  GENERAL:  He is a well-nourished, well-developed  male in no acute distress.  VITAL SIGNS:  Blood pressure 101/40, pulse 68, respirations 19,  temperature 98, oxygen saturation 97% on 2 liters, weight 140.2 kg.  HEENT:  Normal.  NECK:  Without JVD.  LYMPH:  Without lymphadenopathy.  ENDOCRINE:  Without thyromegaly.  CARDIAC:  Normal S1 and S2, regular rate and rhythm without murmur.  LUNGS:  With decreased breath sounds bilaterally.  No  rales.  Expiratory  wheezes noted bilaterally.  SKIN:  Without rash.  ABDOMEN:  Soft, nontender with normoactive bowel sounds.  No  organomegaly.  EXTREMITIES:  With trace 1+ edema bilaterally.  MUSCULOSKELETAL:  Without joint deformity.  NEUROLOGIC:  He is alert and oriented x3.  Cranial nerves II-XII are  grossly intact.   DIAGNOSTICS:  1. Chest x-ray:  Cardiomegaly, vascular congestion and atelectasis is      noted.  2. EKG unavailable.   LABORATORY DATA:  White count 5900, hemoglobin 12.6, platelet count  135,000, potassium 3.6, creatinine 13.18, glucose 104, blood cultures  negative x1 day.   ASSESSMENT:  1. Hypotension.  2. End-stage renal disease on hemodialysis.  3. Normal coronary arteries by cardiac catheterization February 2009.      Good left ventricular function.  4. Diabetes mellitus.  5. Hyperlipidemia.  6. Obesity.  7. Paroxysmal atrial fibrillation on amiodarone therapy.   RECOMMENDATIONS:  The patient was also interviewed and examined by Dr.  Dietrich Pates.  The patient presents with hypotension.  He has had similar  episodes in the past without clear etiology.  It is possible that his  blood pressure findings are secondary to hypovolemia secondary to  excessive fluid removal at dialysis.  His blood pressures seem to be  better now and we will decrease his midodrine to 5 mg 3 times a day.  His amiodarone will be decreased to 200 mg a day.  He says that he has  not taken this twice a day since September 2009.  At this point, we  think he is probably okay for discharge in the morning after his  dialysis.   Thank you very much for the consultation.  We will be glad to follow the  patient throughout the remainder of this admission.      Tereso Newcomer, PA-C      [name], MD, [facility]  Electronically Signed    SW/MEDQ  D:  02/13/2009  T:  02/13/2009  Job:  [id number]   cc:   [name], M.D.

## 2011-05-06 ENCOUNTER — Encounter: Payer: Self-pay | Admitting: Adult Health

## 2011-05-06 ENCOUNTER — Ambulatory Visit (INDEPENDENT_AMBULATORY_CARE_PROVIDER_SITE_OTHER): Payer: Medicare Other | Admitting: Internal Medicine

## 2011-05-06 ENCOUNTER — Ambulatory Visit (INDEPENDENT_AMBULATORY_CARE_PROVIDER_SITE_OTHER): Payer: Medicare Other | Admitting: Adult Health

## 2011-05-06 DIAGNOSIS — I4891 Unspecified atrial fibrillation: Secondary | ICD-10-CM

## 2011-05-06 DIAGNOSIS — E669 Obesity, unspecified: Secondary | ICD-10-CM

## 2011-05-06 DIAGNOSIS — E785 Hyperlipidemia, unspecified: Secondary | ICD-10-CM

## 2011-05-06 NOTE — Assessment & Plan Note (Signed)
Rate is up today, but in NSR.  Has not taken medications yet today.  He is without complaint of racing heart rate or palpitations.  Will continue same medications.  Will see him in 6 months unless symptomatic,

## 2011-05-06 NOTE — Assessment & Plan Note (Signed)
He has lost 13 lbs with diet and increased exercise.  He is happy about this and wants continue to lose and feel better.  He is encouraged to do so,

## 2011-05-06 NOTE — Patient Instructions (Signed)
**Note De-identified Cherisa Brucker Obfuscation** Your physician recommends that you continue on your current medications as directed. Please refer to the Current Medication list given to you today.  Your physician recommends that you schedule a follow-up appointment in: 6 months  

## 2011-05-06 NOTE — Progress Notes (Signed)
HPI:  Albert Lewis comes today for continued assessment and treatment of PAF, hypertension and hyperlipidemia, hyperchoesterolemia.  He is having ongoing dialysis MWF. He has DM as well. Blood has been drawn yesterday.  We will request results. He has been feeling well., He has lost 13 lbs by increasing exercise, better diet. Blood glucose is better.  He is without complaint.  Allergies  Allergen Reactions  . Penicillins     Current Outpatient Prescriptions  Medication Sig Dispense Refill  . allopurinol (ZYLOPRIM) 100 MG tablet Take 100 mg by mouth daily. One by mouth once daily       . amLODipine (NORVASC) 10 MG tablet Take 10 mg by mouth daily. Take 1/2 tablet daily         . aspirin (BABY ASPIRIN) 81 MG chewable tablet Chew 81 mg by mouth daily. One by mouth daily       . B Complex-C-Folic Acid (NEPHRO-VITE PO) Take 1 mg by mouth.        . cinacalcet (SENSIPAR) 30 MG tablet Take 30 mg by mouth daily. Take one tablet by mouth once a day        . cyclobenzaprine (FLEXERIL) 10 MG tablet Take 10 mg by mouth at bedtime. Take one tablet by mouth at bedtime as needed for right hip pain        . fluticasone (FLONASE) 50 MCG/ACT nasal spray 1 spray by Nasal route daily. One puff per nostril daily for allergies        . gabapentin (NEURONTIN) 300 MG capsule Take 300 mg by mouth 2 (two) times daily. One tablet by mouth two times a day       . glipiZIDE (GLUCOTROL) 10 MG tablet Take 10 mg by mouth 2 (two) times daily before a meal. Take one tablet two times a day       . HYDROCORTISONE ACE, RECTAL, (PROCTOCORT) 30 MG SUPP Place rectally as directed. Insert one suppository 3 times daily as for one week, then as needed       . insulin lispro protamine-insulin lispro (HUMALOG MIX 50/50 KWIKPEN) (50-50) 100 UNIT/ML SUSP Inject into the skin. Use as directed         . loratadine (CLARITIN) 10 MG tablet Take 10 mg by mouth daily.        Marland Kitchen losartan (COZAAR) 100 MG tablet Take 100 mg by mouth daily. Take one  tablet by mouth once a day       . Omega-3 Fatty Acids (SEA-OMEGA PO) Take 300 mg by mouth 3 (three) times daily. Take two tablets by mouth three times a day       . oxyCODONE-acetaminophen (PERCOCET) 5-325 MG per tablet Take 1 tablet by mouth as directed. Take two tablets by mouth as needed for pain       . pantoprazole (PROTONIX) 40 MG tablet Take 40 mg by mouth daily.        . pentoxifylline (TRENTAL) 400 MG CR tablet Take 400 mg by mouth 3 (three) times daily with meals. Take one tablet by mouth three times a day       . senna (SENNA CONCENTRATE) 8.6 MG tablet Take 1 tablet by mouth daily. Take two by mouth two times a day       . sevelamer (RENVELA) 800 MG tablet Take 800 mg by mouth 3 (three) times daily with meals. Take five tablets three times a day before meals         . DISCONTD: B  Complex-C-Folic Acid (NEPHRO-VITE PO) Take by mouth.        . DISCONTD: cetirizine (ZYRTEC ALLERGY) 10 MG tablet Take 10 mg by mouth daily. One tablet by mouth qd       . DISCONTD: diclofenac sodium (VOLTAREN) 1 % GEL Apply topically. Apply three times a daily to affected hip as needed       . DISCONTD: gemfibrozil (LOPID) 600 MG tablet Take 600 mg by mouth 2 (two) times daily before a meal. Take one tablet two times a day         . DISCONTD: Omeprazole 20 MG TBEC Take by mouth. Take one by mouth once daily           Past Medical History  Diagnosis Date  . Paroxysmal atrial fibrillation 2/09    Abnormal stress nuclear study in 01/2008; normal coronary angiography  . Hyperlipidemia 2008  . Hypertension     Mild to moderate LVH  . Diabetes mellitus, type 2   . ESRD (end stage renal disease) on dialysis 2007  . Obesity   . Anemia   . GERD (gastroesophageal reflux disease)   . Hyperparathyroidism   . Allergic rhinitis   . Lower gastrointestinal bleed 04/2008    diverticulosis; hemorrhoids  . Diverticular disease   . DJD (degenerative joint disease)   . Chronic back pain   . Obstructive sleep apnea       Treated with CPAP  . Gallop rhythm     Past Surgical History  Procedure Date  . Back surgery     Shunt placement   . Cholecystectomy 1999  . Abdominal surgery -secondary to shunt perforation   . Bilateral cataract surgery with implants 2008    XBJ:YNWGNF of systems complete and found to be negative unless listed abovePHYSICAL EXAM BP 135/63  Pulse 89  Ht 6\' 2"  (1.88 m)  Wt 265 lb (120.203 kg)  BMI 34.02 kg/m2  SpO2 97% General: Well developed, well nourished, in no acute distress Head: Eyes PERRLA, No xanthomas.   Normal cephalic and atramatic  Lungs: Clear bilaterally to auscultation and percussion. Heart: HRRR S1 S2,   A little tachycardic.  Pulses are 2+ & equal.            No carotid bruit. No JVD.  No abdominal bruits. No femoral bruits. Abdomen: Bowel sounds are positive, abdomen soft and non-tender without masses or                  Hernia's noted. Msk:  Back normal, normal gait. Normal strength and tone for age. Extremities: No clubbing, cyanosis or edema.  DP +1 Dialysis shunt noted good thrill, Neuro: Alert and oriented X 3. Psych:  Good affect, responds appropriately EKG:.NSR with 1 degree block. Rate of 91bpm  ASSESSMENT AND PLAN

## 2011-05-06 NOTE — Assessment & Plan Note (Signed)
Labs have just been drawn. Will get copies.  Followed by [facility] for medication adjustments.

## 2011-05-07 NOTE — Consult Note (Signed)
NAME:  AKEEN, LEDYARD               ACCOUNT NO.:  [id number]   MEDICAL RECORD NO.:  [id number]          PATIENT TYPE:  INP   LOCATION:  A213                          FACILITY:  [facility]   PHYSICIAN:  [name], M.D.DATE OF BIRTH:  [birth year]   DATE OF CONSULTATION:  DATE OF DISCHARGE:                                   CONSULTATION   REASON FOR CONSULTATION:  Worsening of renal failure.   Mr. Frett is a 60 year old African-American with past medical history of  hypertension, obesity, chronic renal failure, polycystic kidney disease,  presently came with complaints of shortness of breath, difficulty in  breathing, exertional dyspnea, and was found to have CHF, hence the reason  for evaluation.  The patient also complaining of substernal chest pain.  He  denies any nausea or vomiting that is present at this time.  He feels  better.   PAST MEDICAL HISTORY:  He has history of hypertension, chronic renal  failure, obesity, diabetes mellitus, history of hyperlipidemia, history of  GERD, history of bradycardia, history of polycystic kidney disease, history  of anemia.   SURGICAL HISTORY:  He is status post AV fistula placement in his left arm at  the hospital, history of abdominal surgery a long time ago for reason  unknown, history of also cholecystectomy.   FAMILY HISTORY:  His mother has a history of diabetes, also breast CA, MI.  Father has history of diabetes.   SOCIAL HISTORY:  No history of smoking, no history of alcohol abuse.   ALLERGIES:  He is allergic to PENICILLIN.   His medications at this moment consist of:  1.  Allopurinol 100 mg p.o. daily.  2.  Aspirin 81 mg p.o. daily.  3.  Tenormin 50 mg p.o. daily.  4.  Calcium carbonate 500 mg p.o. t.i.d.  5.  __________ 30 mg p.o. daily.  6.  Lasix 60 mg IV q.12h.  7.  Glucotrol 5 mg p.o. daily.  8.  Novolin insulin.  9.  Plendil 20 mg p.o. daily.  10. Actos 45 mg p.o. daily.  11. K-Dur 20 mEq p.o. daily.  12. Zocor  is 80 mg p.o. daily.   REVIEW OF SYSTEMS:  Presently feels better.  His main complaint of chest  pain is gone, but he still has shortness of breath, orthopnea, exertional  dyspnea.  He has also some swelling of his leg.  Otherwise feels okay.  He  denies any urgency or frequency.  No history of diarrhea.   PHYSICAL EXAMINATION:  VITAL SIGNS:  His temperature is 98.2, pulse of 57,  blood pressure 118/50.  HEENT:  No conjunctival pallor, no icterus.  Oral mucosa seems to be dry.  NECK:  Supple, no JVD.  CHEST:  Decreased breath sound bilaterally and no rales.  He has some  wheezing.  CARDIAC:  Distant S1, S2.  No murmur.  ABDOMEN:  Obese, very difficult to palpate for organomegaly.  Positive bowel  sounds.  EXTREMITIES:  He has about 2+ edema.  He has AV fistula on his left arm.   His 24-hour urine is about 1100 mL.  His blood work:  His white blood cell  count is 7.1, hemoglobin is 33.3, and platelets of 171.  His sodium is 139,  potassium 3.1, BUN 48, creatinine 4.2, calcium 7.1, and albumin is 3.  His  BNP is 179.  MB fraction is 1.4 and troponin is less than 0.05.   ASSESSMENT:  1.  Renal insufficiency, chronic, possibly diabetic nephropathy.  However,      since he has polycystic kidney disease, that probably may also      contribute to his problem.  Presently his BUN and creatinine are      slightly high but overall seems to be stable.  2.  Shortness of breath.  Probably multifactorial, including sleep apnea as      the patient has secondary to his obesity, probably pulmonary      hypertension and also from possibly fluid overload related to his salt      and fluid intake and also renal insufficiency.  3.  Anemia, probably related to his chronic kidney disease.  4.  Hypokalemia.  Potassium moderately low, related to the diuretic use.  5.  Diabetes.  6.  History of hypertension.  BP seems to be controlled very well.   RECOMMENDATIONS:  I agree with potassium replacement.   Presently will  increase his potassium to 40 mEq p.o. daily.  I agree with diuretics.  I  will do iron studies and also will check his PTH, intact PTH, and probably  we may need to start him on Zemplar or calcitriol.  I will continue his  other medications and I will follow the patient.      [name], M.D.  Electronically Signed     BB/MEDQ  D:  11/12/2005  T:  11/12/2005  Job:  [id number]

## 2011-05-07 NOTE — Discharge Summary (Signed)
[facility]  Patient:    Albert Lewis, Albert Lewis Visit Number: [id number] MRN: [id number]          Service Type: MED Location: 2A [phone] Attending Physician:  Marcene Corning Dictated by:   Marcene Corning, M.D. Admit Date:  05/25/2002 Discharge Date: 05/28/2002   CC:         Avon Gully, M.D.  [facility] [place]   Discharge Summary  CONSULTATION:  Dr. Daleen Squibb, cardiology.  DISCHARGE DIAGNOSES:  1. Atypical chest pain felt not to be cardiac in origin with a stress     Cardiolite consistent with a small area of nonreversible ischemia in the     anteroseptal wall of the left ventricle.  Ejection fraction 54% and no     wall motion abnormalities.  2. Bronchitis much improved with Levaquin during this hospitalization.     Discharged on Levaquin for additional four days.  3. Chronic renal failure.  His creatinine runs 2.4-2.5.  He states he has an     appointment with the nephrologist at the [facility] as an outpatient.  No     work-up was initiated during this hospitalization.  4. Hypertension with blood pressure currently ranging from a systolic in the     130s-160s and diastolics in the 60s-90 range.  5. Diabetes which is non-insulin dependent.  Blood sugars 100-200 on Actos as     noted below.  6. Anemia with a hemoglobin slightly decreased on admission at 12.4 with an     MCV of 88.7.  Stools that were ordered during this hospitalization were     not guaiaced and this will need to be followed up as an outpatient.  7. Questionable congestive heart failure with very small effusions noted on     chest x-ray.  His echocardiogram is still pending at day of discharge and     will not be ready until tomorrow.  I will have the echocardiogram results     faxed to Dr. Vista Deck office as he will follow up with Dr. Felecia Shelling at     discharge.  Again, though, the Cardiolite study showed ejection fraction     54% which is normal and no wall motion abnormalities so I am assuming that     the echocardiogram will probably be normal.  8. Bradycardia.  He did have heart rates in the 40s during this     hospitalization.  I decreased the dose of his beta blocker and his heart     rate did improve to the 50s.  9. Sleep apnea.  He was continued on his CPAP during this hospitalization. 10. Gastroesophageal reflux disease.  He is on Prevacid as an outpatient and     this will be continued as an outpatient. 11. Hypercholesterolemia on simvastatin 40 mg q.d. and this was continued     throughout his hospitalization. 12. Questionable gout.  Patient was on allopurinol initially for history of     possible gout and this, again, was continued during this hospitalization.  DISCHARGE MEDICATIONS:  1. Levaquin 500 mg p.o. q.d. for four days.  2. Aspirin 81 mg p.o. q.d.  3. Actos 45 mg q.d.  4. Simvastatin 40 mg q.d.  5. Atenolol 50 mg q.d.  6. Lasix 80 mg in the a.m., 40 mg in the p.m.  7. Cozaar 100 mg q.d.  8. Felodipine 5 mg q.d.  9. Multivitamin one q.d. 10. Allopurinol 300 mg q.d. 11. Potassium chloride 40 mEq q.d.  FOLLOWUP:  The patient states he is going to follow up with Dr. Felecia Shelling at discharge.  I have asked him to follow up with Dr. Felecia Shelling in the next one to two weeks.  [facility] one [facility] two weeks[facility]  ACTIVITY:  As tolerated.  DIET:  Low salt.  DISCHARGE INSTRUCTIONS:  I have asked him to avoid nonsteroidals except aspirin secondary to his renal failure and he will continue with his CPAP as prior to admission.  HISTORY AND PHYSICAL:  Please refer to the dictated history and physical.  LABORATORY AND X-RAYS:  Patients stress Cardiolite did not show any significant reversible ischemia.  Did show a very small fixed defect in his left ventricle wall as noted above.  Cardiology consultation was obtained and they felt that if his stress test was negative he could safely be discharged to home with close follow-up via his primary M.D. and  the [place].  On day of discharge patient had a stress Cardiolite which was negative for any evidence of reversible ischemia.  Ejection fraction 54% and no wall motion abnormalities, although there was a very small fixed defect in the left ventricle wall as noted above.  He ruled out for myocardial infarction by enzymes.  CBC showed white cell count 7.4, hemoglobin 12.4, platelets 184,000.  PT and PTT were normal.  His chemistry panel showed a creatinine 2.4 and 2.5 respectively.  His liver function tests were normal.  His electrolytes were normal with sodium 139, potassium 3.9, bicarbonate 24 last checked May 27, 2002.  His chest x-ray showed pulmonary vascular congestion on admission and some mild cardiomegaly.  His EKG showed a sinus bradycardia with rates of 49 and telemetry monitoring showed some bradycardia with rates in the low 40s and a questionable rate at 37 at one time but that was only for one to two beats.  HOSPITAL COURSE:  #1 - ATYPICAL CHEST PAIN:  On admission patient had pain that was atypical but because of his multiple risk factors as noted below and the quality of his pain, it was felt that admission was indicated.  He was admitted to telemetry, ruled out for myocardial infarction by enzymes.  Patient underwent a chest Cardiolite day of discharge which was negative for any reversible ischemia, but did show a small fixed defect in the left ventricle wall which was stated to be very small.  Ejection fraction 54% and there was no wall motion abnormalities.  Cardiology consultation was obtained and they felt that if his stress test was negative that he could be safely discharged to home with close follow-up with his primary care physician.  He has been seen at the [place] in the past and states he will follow up with Dr. Felecia Shelling.  I have asked him to call Dr. Vista Deck office tomorrow and I will send Dr. Felecia Shelling a discharge summary  today as I cannot talk with him as it is after 5:00  and he is not on-call at this time.  #2 - QUESTIONABLE CONGESTIVE HEART FAILURE:  On admission his chest x-ray showed some questionable vascular congestion.  I had ordered a 2-D echocardiogram.  It has been done today, but is not available at this time and results will not be available until tomorrow.  Because his stress Cardiolite showed normal ejection fraction and no wall motion abnormalities, I feel that he can be safely discharged to home with his echocardiogram followed up as an outpatient.  #3 - HYPERTENSION:  The patients blood pressure stayed reasonably controlled with systolics in the 130s-150 to occasionally 160 range on medication as noted above.  He will be discharged on the above medications with close follow-up as an outpatient.  #4 - BRADYCARDIA:  The patient did have heart rates at times in the low 40s. I elected to decrease his atenolol dose to 50 mg q.d. and his heart rate now is mainly in the 50s-60 range.  #5 - QUESTIONABLE GOUT:  He was maintained on his allopurinol during this hospitalization.  #6 - HYPERCHOLESTEROLEMIA:  Patient was maintained on his simvastatin this hospitalization.  I did not check a lipid panel.  He states it has been done regularly as an outpatient. Dictated by:   Marcene Corning, M.D. Attending Physician:  Marcene Corning DD:  05/28/02 TD:  05/30/02 Job: [id number]

## 2011-05-07 NOTE — Group Therapy Note (Signed)
NAME:  Albert Lewis, Albert Lewis               ACCOUNT NO.:  [id number]   MEDICAL RECORD NO.:  [id number]          PATIENT TYPE:  INP   LOCATION:  A213                          FACILITY:  [facility]   PHYSICIAN:  Catalina Pizza, M.D.        DATE OF BIRTH:  [birth year]   DATE OF PROCEDURE:  11/14/2005  DATE OF DISCHARGE:                                   PROGRESS NOTE   SUBJECTIVE:  The patient does not have any significant complaints.  He feels  like he is maybe catching a cold, but no other real symptoms associated with  this.  His breathing is much better and states that he cannot really sleep  as well with CPAP because it is not the same one he uses at home.   OBJECTIVE:  VITAL SIGNS:  Temperature 97.5, systolic blood pressure 144/76,  pulse 56, respirations 20.  CBGs 152 and 104 on last two checks.  Weight is  325.6.  He is 95% on room air.  GENERAL:  This is a morbidly obese, African-American male in acute distress.  LUNGS:  Diminished crackles at the bases compared to yesterday.  Good air  movement throughout.  CARDIAC:  Heart regular rate and rhythm.  ABDOMEN:  Protuberant, nontender, positive bowel sounds.  EXTREMITIES:  No lower extremity edema as compared to yesterday.   LABORATORY DATA AND X-RAY FINDINGS:  CBC shows a white count of 7.6,  hemoglobin 11.3, MCV 88.2, platelet count 183.  BMET shows sodium of 150,  potassium 3.5, chloride 104, CO2 27, glucose 96, BUN 56, creatinine 5.2,  calcium 9.1.   ASSESSMENT/PLAN:  1.  Congestive heart failure, multifactorial.  The patient appears to be      doing well at this time and did have a 1.8 L off yesterday, but unsure      if total intake is correct.  2.  Obstructive sleep apnea, probably obesity hypoventilation syndrome.      Continue on the continuous positive airway pressure.  He may bring his      own continuous positive airway pressure in for this.  3.  Renal failure, nonoliguric. He does have creatinine above slightly and      the Lasix  dose will be diminished to 60 mg once a day opposed to twice a      day, question of getting overdiuresed given his in's and out's.  4.  Hypokalemia.  Will continue to monitor, but is okay at this time.  5.  Hypertension.  Blood pressures up and down, but will continue for now.  6.  Diabetes.  Continue on current regimen.  No significant problems.      Catalina Pizza, M.D.  Electronically Signed     ZH/MEDQ  D:  11/14/2005  T:  11/15/2005  Job:  [id number]

## 2011-05-07 NOTE — Op Note (Signed)
NAME:  HASHIM, EICHHORST               ACCOUNT NO.:  [id number]   MEDICAL RECORD NO.:  [id number]          PATIENT TYPE:  AMB   LOCATION:  DAY                           FACILITY:  [facility]   PHYSICIAN:  R. Roetta Sessions, M.D. DATE OF BIRTH:  07-19-1951   DATE OF PROCEDURE:  01/09/2007  DATE OF DISCHARGE:                               OPERATIVE REPORT   INDICATIONS FOR PROCEDURE:  Patient is a 60 year old African-America  male followed locally by Dr. Felecia Shelling and Dr. Birder Robson at the [facility] and has been referred for screening colonoscopy, has no  lower GI tract symptoms.  He has never his colon imaged previously and  there is no family history colorectal neoplasia. Colonoscopy is now  being done as standard screening maneuver.  This approach has been  discussed with the patient at some length.  Potential risks, benefits  and alternatives have been reviewed, questions answered and he is  agreeable.  Please see documentation in the medical record.   PROCEDURE NOTE:  O2 saturation, blood pressure, pulse were monitored  throughout entire procedure.  Conscious sedation was Versed 4 mg IV,  Demerol 100 mg IV in divided doses.  Instrument was Pentax video chip  system.   FINDINGS:  Digital rectal exam revealed no abnormalities.  The prep was  good. Examination of the colonic mucosa at rectosigmoid junction through  the left transverse right colon to the appendiceal orifice, ileocecal  valve and cecum were undertaken.  These structures well seen  photographed for the record.  From this level scope was withdrawn.  Previous mucosal surfaces were again seen.  The patient had scattered  left-sided diverticula.  Remaining colonic mucosa appeared normal.  Scope was pulled down into the rectum where retroflexed view of the  distal rectum and view of the rectal mucosa revealed multiple 3 mm  hyperplastic appearing polyps.  They were cold biopsied/removed.  The  remainder of rectal mucosa  appeared normal.  The patient tolerated  procedure well and was reactivated in endoscopy.   IMPRESSION:  1. Diminutive rectal polyps status post cold biopsy.  Otherwise normal      rectum.  2. Left-sided diverticula.  Colonic mucosa appeared normal.   RECOMMENDATIONS:  1. Diverticulosis literature provided Mr. Lewman.  2. Follow-up on path.  3. Further recommendations to follow.      [name], M.D.  Electronically Signed     RMR/MEDQ  D:  01/09/2007  T:  01/09/2007  Job:  [id number]   cc:   Dr. Birder Robson [facility]   Tesfaye D. Felecia Shelling, MD  Fax: [phone]

## 2011-05-07 NOTE — Procedures (Signed)
NAME:  KELE, BARTHELEMY               ACCOUNT NO.:  [id number]   MEDICAL RECORD NO.:  [id number]          PATIENT TYPE:  INP   LOCATION:  A213                          FACILITY:  [facility]   PHYSICIAN:  Jesse Sans. Wall, M.D.   DATE OF BIRTH:  1951-10-04   DATE OF PROCEDURE:  DATE OF DISCHARGE:                                  ECHOCARDIOGRAM   PROCEDURE:  Echocardiogram.   INDICATIONS:  Congestive heart failure 428.0.   DESCRIPTION OF PROCEDURE:  The echocardiogram was technically adequate.  The  echocardiogram is compared to that of May 25, 2002.   CONCLUSIONS:  1.  Moderate left atrial enlargement.  2.  Normal left ventricular chamber size and overall systolic function.      Ejection fraction is 55-60%.  3.  Mild-to-moderate left ventricular concentric hypertrophy.  4.  Normal aortic valve.  5.  Normal right-sided structures and function.  6.  No pericardial effusion.  7.  Compared to the previous echocardiogram there has been no significant      change.      Thomas C. Wall, M.D.  Electronically Signed     TCW/MEDQ  D:  11/12/2005  T:  11/12/2005  Job:  [id number]   cc:   Tesfaye D. Felecia Shelling, MD  Fax: [phone]   Hanley Hays. Josefine Class, M.D.  Fax: [phone]

## 2011-05-07 NOTE — Consult Note (Signed)
[facility]  Patient:    GREYCEN, FELTER Visit Number: [id number] MRN: [id number]          Service Type: MED Location: 2A [phone] Attending Physician:  Marcene Corning Dictated by:   Valera Castle, M.D. Proc. Date: 05/28/02 Admit Date:  05/25/2002 Discharge Date: 05/28/2002   CC:         Marcene Corning, M.D.   Consultation Report  REASON FOR CONSULTATION:  I was asked by Dr. Marcene Corning to evaluate Mr. Gastineau for chest pain and multiple cardiac risk factors.  HISTORY OF PRESENT ILLNESS:  Mr. Henney is a 60 year old black male who has a history of chronic renal failure, difficult-to-control hypertension, hyperlipidemia, non-insulin-dependent diabetes, and obesity.  He is followed at the [facility] for his primary care in [place].  On May 25, 2002, he was admitted after having some right chest and right shoulder discomfort.  He says he has been having this off and on for several weeks.  It is not particularly made worse by exertion; in fact, it can occur at rest.  He denies any other associated symptoms.  These include nausea, diaphoresis, shortness of breath, or syncope.  When he arrived to the emergency room here, he was in sinus rhythm with no ischemic changes.  His cardiac enzymes have been negative.  A subsequent EKG shows no change.  He has been bradycardic, and Dr. Christella Hartigan is titrating his beta blocker.  PAST MEDICAL HISTORY:  MEDICATIONS:  In addition to the above, his medications on admission were: 1. Cozaar 100 mg per day. 2. Actos 45 mg a day. 3. Demadex 20 mg a day. 4. Lisinopril, unknown dose. 5. Atenolol, unknown dose. 6. Zocor, unknown dose. 7. Prevacid, unknown dose.  He said he was also on some other medications which he could not remember.  PAST SURGICAL HISTORY:  He has had previous cholecystectomy.  He has had surgery for bowel adhesions.  FAMILY HISTORY:  Really noncontributory except that his sister and mother  had diabetes.  SOCIAL HISTORY:  He is married.  He works at [employer] here in [place]. He smoked a pack and one-half of cigarettes a day until about five years ago. There is no history of alcohol use or drug abuse.  PHYSICAL EXAMINATION:  VITAL SIGNS:  He is currently running a blood pressure of 110/70, though during the day he gets up into the 140 to 150 systolic range.  His heart rate is 52 in sinus bradycardia.   His respiratory rate is 20 and unlabored.  He is afebrile.  SKIN:  Warm and dry.  HEENT:  Unremarkable.  NECK:  Carotid upstrokes are equal bilaterally without bruits.  There is no JVD.  Thyroid was nonpalpable.  LUNGS:  Clear to auscultation and percussion except for a few dry crackles in the bases.  HEART:  Difficult-to-place PMI.  He has a very soft S1 and S2, and S2 does split.  There is no obvious murmur.  ABDOMEN:  Very protuberant with good bowel sounds.  Organomegaly was difficult to assess.  EXTREMITIES:  No cyanosis, clubbing, or edema.  Pulses were present bilaterally, both dorsalis pedis and posterior tibials.  NEUROLOGIC:  Grossly intact.  DIAGNOSTIC DATA:  Other laboratory data showed a creatinine of 2.4.  His hemoglobin was 12.4, platelets adequate.  LFTs were normal.  As mentioned above, his cardiac enzymes have been negative.  Chest x-ray showed some interstitial edema.  ASSESSMENT: 1. Atypical chest pain, though multiple cardiac risk factors.  I  am sure the    patient has some degree of atherosclerosis throughout his systemic arterial    circulation including his coronaries.  He needs stress Cardiolite today to    rule out obstructive disease.  I explained this to him and his wife at    length.  I also emphasized risk factor modification at length. 2. Hypertension. 3. Hyperlipidemia. 4. Obesity. 5. Type 2 diabetes. 6. Remote history of tobacco use.  Thank you very much for the consultation.  We will perform the  stress Cardiolite today.  If it is negative, I see no reason why he cannot go home pending Dr. Christella Hartigan approval. Dictated by:   Valera Castle, M.D. Attending Physician:  Marcene Corning DD:  05/28/02 TD:  05/29/02 Job: [id number]

## 2011-05-07 NOTE — Consult Note (Signed)
NAME:  Albert Lewis, Albert Lewis                         ACCOUNT NO.:  [id number]   MEDICAL RECORD NO.:  [id number]                   PATIENT TYPE:  INP   LOCATION:  A331                                 FACILITY:  [facility]   PHYSICIAN:  [name], M.D.             DATE OF BIRTH:  [birth year]   DATE OF CONSULTATION:  02/10/2003  DATE OF DISCHARGE:                                   CONSULTATION   REASON FOR CONSULTATION:  Worsening of renal failure.   PERSON CALLING CONSULT:  Dr. Josefine Class.   HISTORY OF PRESENT ILLNESS:  The patient is a 60 year old African-American  with a past medical history of hypertension, chronic renal failure, and type  2 diabetes, presently admitted with cough and also intermittent shortness of  breath.  When he was in the emergency room he was found to have elevation of  his BUN and creatinine plus exacerbation of his COPD; hence, the patient is  admitted to the hospital.  The patient is well known to me.  He has chronic  renal failure possibly secondary to polycystic kidney disease.  His baseline  creatinine is about 2 to 2.5 mg/dl.  However, this morning he denies any  nausea, vomiting but he states that he had some diarrhea when he was at  home.  Presently he feels okay.  He denies any shortness of breath.   PAST MEDICAL HISTORY:  1. History of hypertension.  2. Chronic renal failure.  3. History of diabetes.  4. Obesity.  5. Hyperlipidemia.  6. GERD.  7. History of bradycardia.  8. History of also gout.  9. Anemia.  10.      Possible polycystic kidney disease.   PAST SURGICAL HISTORY:  Status post cholecystectomy and also some sort of  abdominal surgery which he does not remember.   FAMILY HISTORY:  His mother and his father have history of diabetes.  His  mother had history of MI, and also his mother has history of breast cancer.   SOCIAL HISTORY:  He is an ex-smoker, stopped smoking about five years ago.  Used to smoke about 1-1/2 packs a day.  He  denies any history of alcohol  abuse presently __________ .   ALLERGIES:  He is allergic to PENICILLIN.   MEDICATIONS:  1. Proventil inhaler q.6h.  2. Zyloprim 100 mg p.o. daily.  3. Norvasc 5 mg p.o. daily.  4. Tenormin 50 mg p.o. daily.  5. Novolin R 10 units subcutaneous q.6h.  6. Actos 45 mg p.o. daily.  7. Zocor 40 mg p.o. daily.  8. IV __________ 120 cc/hr.  9. Atrovent inhaler.   REVIEW OF SYSTEMS:  Cough seems to be getting better.  He has occasional  shortness of breath.  No dizziness.  No chest pain.  No nausea or vomiting,  appetite good.  Diarrhea also.  No shortness of breath.   PHYSICAL EXAMINATION:  VITAL SIGNS:  Temperature 98,  blood pressure 173/66,  heart rate 76.  Weight 297 pounds.  HEENT:  No conjunctival pallor.  No icterus.  Oral mucosa seems to be  somewhat moist.  NECK:  Supple.  No JVD.  CHEST:  Decreased breath sounds.  Otherwise seems to be clear.  HEART:  Regular rate and rhythm.  No murmur.  ABDOMEN:  Soft.  Positive bowel sounds.  EXTREMITIES:  No edema.   PROBLEMS:  1. Renal insufficiency, chronic, secondary possibly to polycystic kidney     disease.  The patient has a baseline creatinine of around 2 to 2.5,     presently increased to 3.5 since the patient has been on diuretics with     history of diarrhea possibly postoperative prerenal syndrome cannot be     ruled out.  At this moment the patient seems to be asymptomatic.  2. Hypertension:  The patient presently on atenolol and Norvasc.  Blood     pressure seems to be controlled reasonably good.  3. Diabetes:  He is on hypoglycemic agent and on insulin, asymptomatic.  4. Obesity.  5. History of anemia, chronic:  Not sure whether this is related to his     renal insufficiency at this moment.  His H&H seem to be stable at 11.5     and 34.7.  6. History of gout:  He was on allopurinol at one time.  7. History of gastroesophageal reflux disease.   ASSESSMENT:  Renal insufficiency, acute on  chronic.  His BUN and creatinine  were 75 and 3.5 when he came and presently seems to be responding to  hydration, possibly prerenal.  Will continue his hydration.  We will  possibly do iron studies __________ decreases.  Once creatinine goes to his  baseline, possibly will follow the patient in the office.  Will continue  with hydration for the next 24 hours and will follow his blood work.  If the  patient has workup as an outpatient at this moment we may not repeat unless  his renal function deteriorates further.                                               [name], M.D.    BB/MEDQ  D:  02/10/2003  T:  02/10/2003  Job:  [id number]

## 2011-05-07 NOTE — Discharge Summary (Signed)
NAME:  Albert Lewis, Albert Lewis               ACCOUNT NO.:  [id number]   MEDICAL RECORD NO.:  [id number]          PATIENT TYPE:  INP   LOCATION:  A213                          FACILITY:  [facility]   PHYSICIAN:  Tesfaye D. Felecia Shelling, MD   DATE OF BIRTH:  [birth year]   DATE OF ADMISSION:  11/11/2005  DATE OF DISCHARGE:  11/30/2006LH                                 DISCHARGE SUMMARY   DISCHARGE DIAGNOSES:  1.  Congestive heart failure.  2.  Chronic renal failure.  3.  Hypertension.  4.  Diabetes mellitus.  5.  Morbid obesity.  6.  Anemia.   DISCHARGE MEDICATIONS:  1.  Lisinopril 20 mg p.o. daily.  2.  Actos 45 mg p.o. daily.  3.  KCl 20 mg p.o. daily.  4.  Allopurinol 100 mg p.o. daily.  5.  Calcium carbonate 1.5 mg p.o. daily.  6.  Omeprazole 20 mg p.o. daily.  7.  Aspirin 81 mg p.o. daily.  8.  Zocor 80 mg p.o. daily.  9.  Lasix 40 mg p.o. daily.  10. Atenolol 50 mg p.o. daily.  11. Glipizide 5 mg p.o. daily.  12. Metolazone 2.5 mg p.o. daily.   DISPOSITION:  The patient was discharged to home in stable condition.   HOSPITAL COURSE:  This is a 60 year old male patient with a history of  multiple medical diagnoses who was admitted due to shortness of breath.  He  was found to have decompensated congestive heart failure and chronic renal  failure.  The patient was started on IV diuretics.  The patient was able to  diurese well.  His symptoms gradually improved.  He was evaluated by a  nephrologist.  His medications were adjusted.  The patient was discharged  back home to be followed as outpatient.     Tesfaye D. Felecia Shelling, MD  Electronically Signed    TDF/MEDQ  D:  12/21/2005  T:  12/21/2005  Job:  [id number]

## 2011-05-07 NOTE — H&P (Signed)
[facility]  Patient:    Albert Lewis, Albert Lewis Visit Number: [id number] MRN: [id number]          Service Type: MED Location: 2A [phone] Attending Physician:  Hilario Quarry Dictated by:   Rulon Abide, M.D. Admit Date:  05/25/2002                           History and Physical  CHIEF COMPLAINT:  "Im having pain in my chest today, and it got better with the nitroglycerin."  HISTORY OF PRESENT ILLNESS:  The patient is a 60 year old black male with a past medical history significant for chronic renal failure, hypertension, hypercholesterolemia, non-insulin-dependent diabetes, and abdominal surgery in the past.  [facility].  The patient states he was at work today and was doing his usual task requiring working with his arms.  He states he developed a right pectoral muscle area type of chest pain which he describes as a dull aching pain at times with some tightening at times.  He states this lasted for several minutes.  He has had the same episode in the past at work and had been treated with some type of medications, possibly pain medications.  He denied any shortness of breath. No dizziness, no radiation to the neck or to the arms, just the jaw.  The pain continued until he was brought to the emergency room where he was treated with nitroglycerin, and the pain is now resolved.  He states he had a stress test approximately one year ago by the [place] which was normal.  He has never had any other problem with his heart.  In the ER he had an EKG which showed a normal sinus rhythm, no ischemic changes.  His initial cardiac enzymes were normal.  Because of the above symptoms and multiple risk factors as noted below it was felt that admission was indicated.  REVIEW OF SYSTEMS:  The patient has occasional headaches, but no headache now. No recent change in his vision or hearing.  No sore mouth or sore throat.  No complaints  of chest pain or tightness or pressure now.  No shortness of breath or wheezing now, although he states he does wheeze occasionally.  No abdominal pain.  No constipation, occasional diarrhea.  No hematuria, dysuria, melena, or bright red blood per rectum.  No complaints of pain in his extremities or joints.  He has had no nausea, vomiting.  No fever, no chills.  No weight loss.  No problems with his skin.  The remainder of his review of systems was negative.  ALLERGIES:  PENICILLIN.  MEDICATIONS: 1. Cozaar 100 mg per day. 2. Actos 45 mg per day. 3. Demadex 20 mg per day. 4. Lisinopril, questionable dose. 5. Atenolol, questionable dose. 6. Zocor, questionable dose. 7. Prevacid, questionable dose. 8. He is also on other medications which he cannot remember.  FAMILY HISTORY:  Positive for grandmother with myocardial infarction. Grandmother with stroke.  Mother had breast cancer.  Sister had diabetes. Mother had diabetes.  SOCIAL HISTORY:  The patient is married.  He works at [employer] here in [place].  He smoked 1-1/2 packs of cigarettes until approximately five years ago.  There is no history of alcohol use or abuse or other drug abuse.  PHYSICAL EXAMINATION:  VITAL SIGNS:  In the emergency room he had vital signs that showed a temperature of 99, pulse 84,  respirations 16 on my exam, blood pressure 160/68, saturation was 94%.  GENERAL:  Well-developed, obese black male in no apparent distress.  HEENT:  Normocephalic, atraumatic.  Pupils are equal, round, and reactive to light.  Conjunctivae clear.  Sclerae nonicteric.  Oropharynx is clear.  Moist mucous membranes.  NECK:  Supple.  No adenopathy, no thyromegaly, no JVD, no bruits.  LUNGS:  Consistent with decreased breath sounds bilaterally, but no rales or wheezes.  HEART:  Regular rate and rhythm.  S1, S2 were noted.  With a 1/6 systolic murmur.  ABDOMEN:  Obese, but soft, nontender, nondistended.  Positive bowel  sounds. No masses but, again, he is very obese.  He has a midline abdominal scar from previous surgery and a right cholecystectomy scar.  GENITOURINARY:  Normal male.  RECTAL:  Not done.  EXTREMITIES:  Shows full range of motion.  No significant edema.  NEUROLOGIC:  Alert and oriented x3.  Cranial nerves II-XII grossly intact. Sensory and motor seem to be intact in the upper and lower extremities bilaterally, with no asymmetry noted.  Cerebellar exam not tested.  LABORATORY DATA:  EKG showed a sinus rhythm but no ischemic changes.  CK 214, MB 1.4, troponin 0.02.  BUN 37, creatinine 2.4.  Glucose slightly elevated at 137.  Electrolytes normal with a sodium of 136, potassium 3.9, chloride 104, bicarbonate 24.  Liver function tests were all normal.  INR 1.1, PTT normal.  CBC showed a white cell count of 7.4, hemoglobin 12.4, MCV of 88.7, platelets of 184,000.  Chest x-ray showed questionable pulmonary vascular congestion.  PROBLEM LIST:  1. Atypical chest pain with no chest pain now, and a history of a stress test     which was negative or normal approximately one year ago.  Normal cardiac     enzymes and normal electrocardiogram at this time.  2. Chronic renal failure with a BUN now 37, creatinine 2.4, and he has an     appointment with nephrology in two weeks at the [place].  3. Hypertension, on multiple medications, some of which he does not know the     dose.  4. Hypercholesterolemia, on Zocor, questionable dose.  5. Morbid obesity.  6. Non-insulin-dependent diabetes, on Actos.  7. History of abdominal surgery for some type of adhesions which developed     from some type of abdominal surgery for a shunt, according to the patient,     but he cannot tell me where the shunt was draining full range of motion.  8. Status post cholecystectomy.  9. Cigarette abuse, approximately 1-1/2 packs per day until five years ago. 10. Anemia, with a hemoglobin of 12.4 and MCV of 88.7, question  secondary to     chronic renal failure versus gastrointestinal bleed versus other etiology.  11. What looks like very mild pulmonary vascular congestion on chest x-ray. 12. PENICILLIN allergy.  PLAN:  Admit him to telemetry.  Rule out myocardial infarction with enzymes. Continue his home medications, and obtain correct doses.  He will need a stress Cardiolite on Monday, and will need to avoid any nephrotoxins in him. He needs sliding scale insulin, Accu-Cheks, and diabetic diet, and p.r.n. clonidine for blood pressure.  I will check his stools for blood while he is here, and will go ahead and treat him with some Lasix 40 mg b.i.d. for this questionable vascular congestion, and repeat chest x-ray before discharge. Also, will schedule him for a 2-D echocardiogram. Dictated by:   Rulon Abide, M.D.  Attending Physician:  Hilario Quarry DD:  05/25/02 TD:  05/27/02 Job: [id number]

## 2011-05-07 NOTE — Procedures (Signed)
[facility]  Patient:    KALUM, MINNER Visit Number: [id number] MRN: [id number]          Service Type: MED Location: 2A [phone] Attending Physician:  Marcene Corning Dictated by:   Rozell Searing, P.A. Admit Date:  05/25/2002 Discharge Date: 05/28/2002                                Stress Test  HISTORY:  Mr. Caspers is a 60 year old male with no prior history of documented coronary artery disease with cardiac risk factors notable for hypertension, dyslipidemia, type 2 diabetes mellitus, and history of tobacco.  He presented to [facility] for evaluation of chest pain.  Serial cardiac enzymes were negative.  TEST:  Patient initially referred for treadmill stress testing.  However, he reported significant fatigue early in the test and was unable to proceed.  His heart rate was approximately 30 beats below the 85% PMHR level.  Test was therefore changed to a pharmacologic stressor.  Adenosine was infused as per protocol.  Cardiolite injected at three minute mark.  Heart rate rose from 66 baseline to 74 maximum, blood pressure 100/70 baseline to 120/74 maximum to 110/76 at conclusion.  Serial EKG tracings revealed no significant ST abnormalities, atrial ventricular block, or arrhythmias.  Patient reported mild anterior chest tightness during stress phase, spontaneous resolution and early recovery.  CONCLUSION:  Negative Adenosine stress test, perfusion images pending. Dictated by:   Rozell Searing, P.A. Attending Physician:  Marcene Corning DD:  05/28/02 TD:  05/29/02 Job: [id number]

## 2011-05-07 NOTE — H&P (Signed)
NAME:  SPENCE, SOBERANO               ACCOUNT NO.:  [id number]   MEDICAL RECORD NO.:  [id number]           PATIENT TYPE:  INP   LOCATION:  A213                          FACILITY:  [facility]   PHYSICIAN:  Hanley Hays. Dechurch, M.D.DATE OF BIRTH:  01/16/1951   DATE OF ADMISSION:  DATE OF DISCHARGE:  LH                                HISTORY & PHYSICAL   The patient is a 60 year old African American gentleman followed by Dr.  Felecia Shelling with past medical history of remarkable for morbid obesity,  obstructive sleep apnea, diabetes mellitus, chronic renal failure, who is  followed at the [facility].  Has actually had a left upper extremity shunt  placement for sometime with this now mature who presents to the hospital  with progressive dyspnea on exertion, decreased exercise tolerance, chronic  fatigue and today noted substernal chest pain and shortness of breath at  rest.  He was given IV Lasix in the emergency room with a 600 mL diuresis  and subsequently felt better.  He is admitted to the hospital for further  evaluation and treatment.   PAST MEDICAL HISTORY:  Dictation stopped here.      Hanley Hays Josefine Class, M.D.  Electronically Signed     FED/MEDQ  D:  11/12/2005  T:  11/12/2005  Job:  [id number]

## 2011-05-07 NOTE — H&P (Signed)
NAME:  Albert Lewis, Albert Lewis NO.:  [id number]   MEDICAL RECORD NO.:  [id number]                    PATIENT TYPE:   LOCATION:                                       FACILITY:   PHYSICIAN:  Sarita Bottom, M.D.                  DATE OF BIRTH:   DATE OF ADMISSION:  DATE OF DISCHARGE:                                HISTORY & PHYSICAL   CHIEF COMPLAINT:  I have a chest cold.   HISTORY OF PRESENT ILLNESS:  [name] is a 60 year old man with a  history of hypertension, diabetes mellitus type II, and chronic renal  insufficiency who was apparently well until about 2-3 weeks ago when he  developed a flu-like illness which he describes as a chest cough productive  of brownish phlegm and associated with malaise and body aches. He self  medicated with over-the-counter Benadryl and NyQuil with little improvement.  He came to the emergency room today for evaluation. While in the emergency  room he was noted to have an elevated BUN and creatinine and decision for  consult and admission.   REVIEW OF SYSTEMS:  He denies any fever, chest pain, palpitations or  shortness of breath. Admits to one episode of diarrhea stools yesterday.  Denies any vomiting. All other systems were reviewed and were negative.   PAST MEDICAL HISTORY:  He has hypertension, diabetes mellitus for several  years. He has chronic renal insufficiency but according to him, his  creatinine was stable the last time he saw his doctor a few days ago. He has  anemia and history of gout.   MEDICATIONS:  Include Actos. He is not too sure about what medications that  he takes but he can remember Actos 45 mg each day and Lisinopril, which he  does not know the dose. His last discharge medications from the chart shows  his medicines to include Actos, Simvastatin 40 mg each day, Atenolol 50 mg  each day, Lasix 80 mg in the morning and 40 mg in the evening. Lodopin 5 mg  each day. Allopurinol 300 mg each  day.   ALLERGIES:  PENICILLIN.   FAMILY HISTORY:  His mother died from breast cancer.   SOCIAL HISTORY:  He is married with two children. He does not smoke. He  stopped smoking five years ago. He has no history of alcohol or drug abuse.  He works with [employer] here in [place].   PHYSICAL EXAMINATION:  VITAL SIGNS: Blood pressure 111/64, heart rate 70.  Afebrile.  GENERAL: A middle aged man. He is obese. In no acute distress.  HEENT: Normal. Nonicteric. Oral mucosa appears moist.  NECK: Supple. There is no lymphadenopathy. No thyromegaly. No jugular venous  distention.  CHEST: Auscultation and percussion good bilaterally.  LUNGS: No wheezes or crackles heard.  HEART: Heart sounds 1 and 2 were normal. Rhythm is regular. No  murmur  appreciated.  ABDOMEN: Obese. He has a surgical scar. Bowel sounds present. No masses on  deep palpation. No tenderness.  NEURO: Alert and oriented times three. No gross focal deficits.  EXTREMITIES: He has 2+ pedal edema bilaterally.   LABORATORY DATA:  Liver function studies done in the emergency room are  normal. BNP reveals sodium 132, potassium 4.0, BUN 75, creatinine 4.5. BUN  and creatinine were 35 and 2.5 respectively in June of 2003. His current  chloride is 99, CO2 31, glucose 156, calcium 8.7, WBC 7.3, neutrophil count  is 74%, lymphocytes 18. Hemoglobin is 11.5. MCV is 88.7, platelet count  183,000.   ASSESSMENT/PLAN:  1. Acute and chronic renal failure. The patient will be admitted to the     ward. We will hydrate him cautiously with normal saline and monitor I&O     and follow-up his BUN and creatinine. The patient will need a renal     consult evaluation on this admission.  2. Bronchitis. The patient will be given Albuterol nebulizer and we will     follow him and see how this one improves his chest symptoms.  3. Hypertension. The patient will be continued on his Lodopin and Atenolol.     We will hold his Lisinopril  because of his elevated creatinine at this     time. Will monitor his blood pressure and adjust his medications     accordingly.  4. Diabetes mellitus. The patient will be resumed on his Actos 45 mg each     day. Accu-check with insulin coverage and check his hemoglobin A1C on     this admission.  5. Anemia of chronic disease. Will monitor his hemoglobin and hematocrit     during this admission. Also check for stool guaiac to see if he has any     bleeding. The patient to be admitted under the are of primary care     physician, Dr. Felecia Shelling. Further workup and management will depend clinical     course. Coordination of this case was more than 45 minutes.                                               Sarita Bottom, M.D.    DW/MEDQ  D:  02/09/2003  T:  02/10/2003  Job:  [id number]   cc:   Ninetta Lights D. Felecia Shelling, M.D.  [address]  [place]  [place] [address]  Fax: [phone]

## 2011-05-07 NOTE — H&P (Signed)
NAME:  Albert Lewis, Albert Lewis               ACCOUNT NO.:  [id number]   MEDICAL RECORD NO.:  [id number]          PATIENT TYPE:  INP   LOCATION:  A213                          FACILITY:  [facility]   PHYSICIAN:  Hanley Hays. Dechurch, M.D.DATE OF BIRTH:  1951-12-20   DATE OF ADMISSION:  11/11/2005  DATE OF DISCHARGE:  LH                                HISTORY & PHYSICAL   This is a repeat performance.   HISTORY OF PRESENT ILLNESS:  The patient is a 60 year old African American  gentleman followed by Dr. Felecia Shelling at the [facility] with a past medical history  remarkable chronic renal failure, status post left upper extremity shunt  placement, type 2 diabetes mellitus, morbid obesity, obstruction sleep  apnea, who presents to the hospital with progressive dyspnea on exertion,  decreased exercise tolerance and today noticed shortness of breath at rest  with substernal chest tightness.  In the emergency room he received oxygen  and Lasix and had a 600 mL diuresis and felt better.  He was admitted to the  hospital for further evaluation.   PAST MEDICAL HISTORY:  1.  Hypertension.  2.  Chronic renal failure.  3.  Diabetes mellitus.  4.  Obesity.  5.  Hyperlipidemia.  6.  Gastroesophageal reflux disease.  7.  Bradycardia.  8.  History of anemia.  9.  Possible polycystic kidney disease.  10. Status post cholecystectomy.  11. Remote history of abdominal surgery of unknown type.   FAMILY HISTORY:  Pertinent for diabetes, coronary artery disease, and breast  cancer in his mother.   SOCIAL HISTORY:  The patient smoked up until 1995.  No alcohol abuse.  He  currently works part-time as a Electrical engineer. He is married and has two  children.  He has 11 siblings.   ALLERGIES:  PENICILLIN.   MEDICATIONS:  1.  He is not sure of what medications he is taking though he knows he takes      atenolol possibly 50 mg daily.  2.  Prilosec.  3.  Aspirin 81 mg daily,  4.  Zocor 80 mg daily.  5.  Glipizide 5 mg  daily.  6.  Lasix 40 mg daily.  7.  Potassium 20 mEq daily.  8.  Actos 45 mg daily.  9.  Calcium carbonate 1500 mg daily.  10. Sennosides 8.1 daily.  11. __________  100 mg daily.  12. __________  2.5 mg daily.  13. __________  10 mg daily.  14. Multivitamin.   His wife is bringing his current updated list.   REVIEW OF SYSTEMS:  The patient is able to perform his ADLs. He notes  incredible fatigue and dyspnea on exertion.  He feels like he is puffy all  over with no frank edema.  He has no GI or GU complaints.  Appetite has  been fair.  He is sleeping okay.  No pain complaints.  Otherwise review of  systems is negative.   PHYSICAL EXAMINATION:  GENERAL:  Pleasant gentleman in no distress.  Oxygen  in place at 2 L. Able to provide history and move about the bed  without  difficulty.  VITAL SIGNS:  Temperature 98.5, pulse 60 in sinus, blood pressure 149/77,  oxygen saturation is 97% on 2 L with 335.2 pounds.  NECK:  Obese.  No bruits.  No masses noted though limited by habitus.  LUNGS:  Diminished without rales bilaterally.  HEART:  Regular. No murmur appreciated.  No gallop.  ABDOMEN:  Obese.  EXTREMITIES:  Without clubbing or cyanosis.  He does not have any dependent  or true edema at this time.   LABORATORY DATA:  Chest x-ray reveals no changes aside from chronic scarring  at the right base compared with previous x-ray in March 2006.   ASSESSMENT/PLAN:  Dyspnea on exertion and poor exercise tolerance in a  patient with multiple co-morbidities including renal failure, morbid obesity  and probable coronary artery disease.  Echo is reasonable.  He had a stress  test in 2003 and at that time there was no significant findings and he has  never had any other work-up of this coronary arteries.  He will be admitted  on telemetry, continue the IV Lasix, and Dr. Kristian Covey will be consulted.  Will do another set of cardiac enzymes.  Follow up his EKG and check an  echo.  Will continue  his usual medications once we have a clarified list as  well as his BiPAP.      Hanley Hays Josefine Class, M.D.  Electronically Signed     FED/MEDQ  D:  11/12/2005  T:  11/12/2005  Job:  [id number]

## 2011-05-07 NOTE — H&P (Signed)
NAME:  Albert Lewis, Albert Lewis                         ACCOUNT NO.:  [id number]   MEDICAL RECORD NO.:  [id number]                   PATIENT TYPE:  INP   LOCATION:  A323                                 FACILITY:  [facility]   PHYSICIAN:  Tesfaye D. Felecia Shelling, M.D.              DATE OF BIRTH:  04/09/1951   DATE OF ADMISSION:  11/11/2003  DATE OF DISCHARGE:                                HISTORY & PHYSICAL   CHIEF COMPLAINT:  Painful swelling of the left arm.   HISTORY OF PRESENT ILLNESS:  This is a 60 year old black male with history  of multiple medical illnesses who came to the emergency room with the above  complaint.  The patient had an accidental burn on his left forearm about  five days for which he was putting some Neosporin ointment and dressing at  home.  The blister which ruptured then formed an ulceration.  The patient at  the time of injury did not go and get treatment.  Two days before admission  the patient started noticing swelling on his left forearm.  The swelling  progressively got worse and it spread toward his arm.  He came to the  emergency room where he was evaluated and he was found to have picture of  cellulitis.  The patient was started on IV antibiotics and was admitted for  further treatment.   REVIEW OF SYSTEMS:  The patient had no fever, chills, cough or shortness of  breath, or abdominal pain, nausea, vomiting, or leg edema.   PAST MEDICAL HISTORY:  1. Diabetes mellitus type 2.  2. Chronic renal failure.  3. Hypertension.  4. History of recurrent bronchitis.  5. Anemia of chronic disease.  6. Gouty arthritis.   CURRENT MEDICATIONS:  1. Norvasc 5 mg p.o. daily.  2. Atenolol 50 mg p.o. daily.  3. Allopurinol 100 mg p.o. daily.  4. Zocor 40 mg p.o. daily.  5. Actos 45 mg p.o. daily.  6. Novolin insulin 10 mg p.o. daily.   PERSONAL AND SOCIAL HISTORY:  The patient is married.  He has no history of  alcohol, tobacco, or substance abuse.   PHYSICAL EXAMINATION:   GENERAL:  The patient is alert, awake, and obese.  VITAL SIGNS:  Blood pressure 140/80, pulse 88, respiratory rate 16,  temperature 98 degrees Fahrenheit.  HEENT:  Pupils are equal, reactive.  NECK:  Supple.  CHEST:  Clear lung fields, good air entry.  CARDIOVASCULAR:  First and second heart sounds heard and no murmur, no  gallop.  ABDOMEN:  Obese, soft, and lax.  Bowel sounds are positive.  No mass, no  organomegaly.  EXTREMITIES:  No leg edema.  There is redness and tender swelling over the  left forearm.  There is an area of ulceration with granulation tissue and  surrounding irritation.   LABORATORY DATA ON ADMISSION:  WBC 6.7, hemoglobin 12.7, hematocrit 37.7,  and platelets 207.  Sodium 142,  potassium 3.7, chloride 107, carbon dioxide  27, glucose 141, BUN 43, creatinine 2.5, and calcium 8.9.   ASSESSMENT:  1. Cellulitis of the left forearm.  2. Third degree burn.  3. Diabetes mellitus type 2.  4. Hypertension.  5. Renal insufficiency.  6. Gouty arthritis.   PLAN:  Will continue the patient on IV antibiotics.  Will continue his  regular medications.  Will do a dressing on his wound.     ___________________________________________                                         Eustaquio Maize Felecia Shelling, M.D.   TDF/MEDQ  D:  11/12/2003  T:  11/12/2003  Job:  [id number]

## 2011-05-07 NOTE — Consult Note (Signed)
NAME:  STEPHFON, BOVEY               ACCOUNT NO.:  [id number]   MEDICAL RECORD NO.:  [id number]           PATIENT TYPE:  AMB   LOCATION:                                FACILITY:  [facility]   PHYSICIAN:  R. Roetta Sessions, M.D. DATE OF BIRTH:  March 26, 1951   DATE OF CONSULTATION:  DATE OF DISCHARGE:                                 CONSULTATION   REQUESTING PHYSICIAN:  [facility].   PRIMARY CARE PHYSICIAN:  Dr. Felecia Shelling.   REASON FOR CONSULTATION:  Screen colonoscopy.   HISTORY OF PRESENT ILLNESS:  Mr. Birchall is a 60 year old Afro-American  male who is referred through the [facility] for screening colonoscopy.  He denies any GI complaints at this time including melena, rectal  bleeding, abdominal pain, constipation, diarrhea or weight loss. There  is no family history of carcinoma.   PAST MEDICAL HISTORY:  Diabetes mellitus, hypertension, sleep apnea,  COPD, renal failure, status post cholecystectomy. He has had 2 back  surgeries.   CURRENT MEDICATIONS:  1. Lasix 40 mg 4 p.o. b.i.d.  2. Aspirin 81 mg daily.  3. Metoprolol 50 mg b.i.d.  4. Calcium 667 mg t.i.d.  5. Calcarb 600 plus vitamin D daily.  6. Omeprazole 20 mg daily.  7. Glipizide 10 mg in the a.m. and 5 mg in the p.m.  8. Lisinopril 30 mg daily.  9. Simvastin 80 mg at bedtime.  10.Kay Ciel 20 mEq daily.  11.Multivitamin daily.  12.__________ ATL 10 mg daily.  13.Omega 30 mg daily.  14.Sodium bicarb 650 b.i.d.  15.Stool softeners once daily.  16.Valsartan 160 mg daily.  17.Allopurinol 100 mg daily.   ALLERGIES:  PENICILLIN.   FAMILY HISTORY:  There is no family history of colorectal carcinoma,  liver or chronic GI problems.   SOCIAL HISTORY:  Mr. Wale is married. He has 2 healthy children. He is  disabled. He has a 30 pack year history of tobacco use. Denies any  alcohol or drug use.   REVIEW OF SYSTEMS:  CONSTITUTIONAL: Vitals stable. Denies any fever or  chills. Denies any anorexia. CARDIOVASCULAR: Denies any  chest pain or  palpitations. PULMONARY: He has been having some wheezing and cough. I  scheduled to see Dr. Felecia Shelling today for URI. GU: Denies any dysuria,  hematuria or increased urinary frequency. GI: See HPI.   PHYSICAL EXAMINATION:  VITAL SIGNS: Weight 330, height 74, temperature  98.6, blood pressure 142/70 and pulse 72.  GENERAL: Mr. Byard is an obese, Afro-American male in no acute  distress.  HEENT: __________.  Conjunctivae pink. Oropharynx pink and moist without  any lesions.  NECK: Supple without any mass or thyromegaly.  CHEST: Heart regular rate and rhythm. Normal S1 and S2.  LUNGS: Decreased breath sounds bilaterally. He does have expiratory  wheezes.  ABDOMEN: Protuberant with positive bowel sounds x4 without palpable mass  or hepatosplenomegaly. No rebound tenderness or guarding. Exam was  limited given patient's body habitus.  EXTREMITIES: With 1+ trace lower extremity edema bilaterally.  SKIN: Brown, warm and dry without rash or jaundice.   IMPRESSION:  Mr. Bourcier is a 60 year old  Afro-American male referred  through the courtesy of [facility] for screening colonoscopy. He denies  any GI complaints or family history at this time.   PLAN:  1. Screening colonoscopy with Dr. Jena Gauss in the future. I discussed the      procedure including risks and benefits which include relative      bleeding, infection, perforation, drug reaction, __________. He is      to hold his aspirin 2 days prior to the procedure.   We would like to thank the [facility] for allowing [place] to participate in  the care of Mr. Nishi.      Nicholas Lose, N.P.      [name], M.D.  Electronically Signed    KC/MEDQ  D:  12/21/2006  T:  12/21/2006  Job:  [id number]   cc:   Tesfaye D. Felecia Shelling, MD  Fax: [phone]

## 2011-05-07 NOTE — Group Therapy Note (Signed)
NAME:  Albert Lewis, Albert Lewis               ACCOUNT NO.:  [id number]   MEDICAL RECORD NO.:  [id number]          PATIENT TYPE:  INP   LOCATION:  A213                          FACILITY:  [facility]   PHYSICIAN:  Catalina Pizza, M.D.        DATE OF BIRTH:  Mar 10, 1951   DATE OF PROCEDURE:  11/13/2005  DATE OF DISCHARGE:                                   PROGRESS NOTE   SUBJECTIVE:  The patient does not have any significant complaints at this  time.  Feels much better as far as his breathing is concerned.  Had a good  night's sleep last night.   OBJECTIVE:  VITAL SIGNS:  Temperature is 98, blood pressure is 137/66, pulse  69, respirations 20, saturating 97% on 3 L.  CBGs have been 136, 129, 87,  respectively.  GENERAL:  This is a morbidly obese African-American male in no acute  distress.  CHEST:  Lungs had some improvement with a few crackles at the bases  bilaterally but relatively good air movement.  CARDIAC:  Heart was regular rate and rhythm.  ABDOMEN:  Protuberant, positive bowel sounds.  EXTREMITIES:  No lower extremity edema, but difficult to assess given the  patient's size.   Lab work:  A BMET shows sodium 139, potassium 3.6, chloride 103, CO2 25,  glucose 138, BUN 53, creatinine of 5.0, calcium 8.6.  Phosphorus was  elevated at 4.9.  Cardiac panels have been negative to date.   ASSESSMENT AND PLAN:  1.  Congestive heart failure, multifactorial.  Most likely the patient will      need a further workup of probable pulmonary hypertension.  Will continue      the diuretic at this time.  He has had a diuresis of 1.6 L yesterday and      another 5.9 L today.  Will follow along closely with renal.  2.  Obstructive sleep apnea, probable obesity hypoventilation syndrome.  Is      on CPAP and appears to be doing well with that.  Even more reason for      checking for pulmonary hypertension.  3.  Renal failure, nonoliguric.  Followed by Dr. Kristian Covey.  No dialysis at      this time.  Continue to  monitor.  4.  Hypokalemia.  Appears to be resolved at this time with a potassium of      3.6.  5.  Hypertension.  Blood pressure seems to be stable.  6.  Diabetes.  Continue current regimen.      Catalina Pizza, M.D.  Electronically Signed     ZH/MEDQ  D:  11/13/2005  T:  11/13/2005  Job:  [id number]

## 2011-05-07 NOTE — Discharge Summary (Signed)
   NAME:  Albert Lewis, Albert Lewis                         ACCOUNT NO.:  [id number]   MEDICAL RECORD NO.:  [id number]                   PATIENT TYPE:  INP   LOCATION:  A331                                 FACILITY:  [facility]   PHYSICIAN:  Tesfaye D. Felecia Shelling, M.D.              DATE OF BIRTH:  [birth year]   DATE OF ADMISSION:  02/09/2003  DATE OF DISCHARGE:  02/12/2003                                 DISCHARGE SUMMARY   DISCHARGE DIAGNOSES:  1. Acute on chronic renal failure.  2. Acute bronchitis.  3. Diabetes mellitus type 2.  4. Hypertension.  5. Anemia of chronic disease.   DISPOSITION:  The patient is discharged to home in stable condition.   DISCHARGE MEDICATIONS:  1. Norvasc 5 mg p.o. once daily.  2. Atenolol 50 mg p.o. once daily.  3. Allopurinol 100 mg p.o. once daily.  4. Zocor 40 mg p.o. once daily.  5. Actos 45 mg p.o. once daily.  6. Allegra 180 mg p.o. once daily for 10 days.  7. Z-Pak.  8. ______ DR 10 mg p.o. three times daily for 7 days.   HOSPITAL COURSE:  The patient is a 60 year old male with known history of  hypertension and diabetes mellitus and renal insufficiency who came to  emergency room due to cough, congestion, and low-grade fever.  On admission,  the patient was found to have worsening of his renal failure.  The patient  also had bronchitis.  He was admitted to the floor and was started on IV  fluid and symptomatic treatment for his bronchitis.  Nephrology consult was  done who assisted in the management of the renal failure.  Over the hospital  stay, the patient's renal function improved.  However, he continued to have  congestion and intermittent cough.  Overall, he has improved and he will be  discharged home on cough medicine and oral antibiotics.                                               Tesfaye D. Felecia Shelling, M.D.    TDF/MEDQ  D:  02/12/2003  T:  02/12/2003  Job:  [id number]

## 2011-05-11 ENCOUNTER — Ambulatory Visit: Payer: Self-pay | Admitting: Family Medicine

## 2011-05-11 ENCOUNTER — Encounter: Payer: Self-pay | Admitting: Adult Health

## 2011-06-01 ENCOUNTER — Telehealth: Payer: Self-pay | Admitting: Family Medicine

## 2011-06-02 NOTE — Telephone Encounter (Signed)
cpap does not qualify him for this, pls let him know

## 2011-06-04 NOTE — Telephone Encounter (Signed)
Called patient left message

## 2011-06-08 ENCOUNTER — Telehealth: Payer: Self-pay | Admitting: *Deleted

## 2011-06-08 NOTE — Telephone Encounter (Signed)
Advised pt that cpap machine alone does not qualify him for a hospital bed

## 2011-06-29 DIAGNOSIS — I2510 Atherosclerotic heart disease of native coronary artery without angina pectoris: Secondary | ICD-10-CM

## 2011-06-30 ENCOUNTER — Encounter: Payer: Self-pay | Admitting: Family Medicine

## 2011-07-06 ENCOUNTER — Encounter: Payer: Self-pay | Admitting: Family Medicine

## 2011-07-06 ENCOUNTER — Ambulatory Visit: Payer: Medicare Other | Admitting: Family Medicine

## 2011-07-20 ENCOUNTER — Encounter: Payer: Medicare Other | Admitting: Cardiology

## 2011-07-20 ENCOUNTER — Encounter: Payer: Self-pay | Admitting: Cardiology

## 2011-08-03 ENCOUNTER — Encounter: Payer: Self-pay | Admitting: Family Medicine

## 2011-08-05 ENCOUNTER — Ambulatory Visit (INDEPENDENT_AMBULATORY_CARE_PROVIDER_SITE_OTHER): Payer: Medicare Other | Admitting: Family Medicine

## 2011-08-05 ENCOUNTER — Encounter: Payer: Self-pay | Admitting: Family Medicine

## 2011-08-05 VITALS — BP 120/54 | HR 73 | Ht 74.0 in | Wt 262.0 lb

## 2011-08-05 DIAGNOSIS — E785 Hyperlipidemia, unspecified: Secondary | ICD-10-CM

## 2011-08-05 DIAGNOSIS — I10 Essential (primary) hypertension: Secondary | ICD-10-CM

## 2011-08-05 DIAGNOSIS — N186 End stage renal disease: Secondary | ICD-10-CM

## 2011-08-05 DIAGNOSIS — E049 Nontoxic goiter, unspecified: Secondary | ICD-10-CM

## 2011-08-05 DIAGNOSIS — K219 Gastro-esophageal reflux disease without esophagitis: Secondary | ICD-10-CM

## 2011-08-05 DIAGNOSIS — E669 Obesity, unspecified: Secondary | ICD-10-CM

## 2011-08-05 DIAGNOSIS — E119 Type 2 diabetes mellitus without complications: Secondary | ICD-10-CM

## 2011-08-05 MED ORDER — PANTOPRAZOLE SODIUM 40 MG PO TBEC: 40.0000 mg | DELAYED_RELEASE_TABLET | Freq: Every day | ORAL | Status: DC

## 2011-08-05 NOTE — Patient Instructions (Addendum)
F/u in 4.5 months.  I am happy that you are doing well.  I will check on medication for your hemmorhoids, this is not covered by most medicare plans, OTC perparations are available , like preparation  H, also check at the [place] when you next go please  pls check on vaccice for shingles in terms of coverage, if covered , we do have it and are happy to administer

## 2011-08-08 NOTE — Assessment & Plan Note (Signed)
Controlled and followed by endocrine, no recent hypoglycemic episodes, no hospitalization in over 12 months for hypoglycemia

## 2011-08-08 NOTE — Assessment & Plan Note (Signed)
tolerates dialysis well since 2007

## 2011-08-08 NOTE — Progress Notes (Signed)
  Subjective:    Patient ID: Albert Lewis, male    DOB: 1951-10-09, 60 y.o.   MRN: [id number]  HPI The PT is here for follow up and re-evaluation of chronic medical conditions, medication management and review of any available recent lab and radiology data.  Preventive health is updated, specifically  Cancer screening and Immunization.   Questions or concerns regarding consultations or procedures which the PT has had in the interim are  addressed. The PT denies any adverse reactions to current medications since the last visit.  There are no new concerns.  There are no specific complaints Reports excellent blood sugars, with no hypo or hyperglycemia. Still needs thyroid US      Review of Systems Denies recent fever or chills. Denies sinus pressure, nasal congestion, ear pain or sore throat. Denies chest congestion, productive cough or wheezing. Denies chest pains, palpitations and leg swelling Denies abdominal pain, nausea, vomiting,diarrhea or constipation.    Mild  joint pain, and limitation in mobility. Denies headaches, seizures, numbness, or tingling. Denies depression, anxiety or insomnia. Denies skin break down or rash.        Objective:   Physical Exam Patient alert and oriented and in no cardiopulmonary distress.  HEENT: No facial asymmetry, EOMI, no sinus tenderness,  oropharynx pink and moist.  Neck supple no adenopathy. Goiter. Chest: Clear to auscultation bilaterally.  CVS: S1, S2 no murmurs, no S3.  ABD: Soft non tender. Bowel sounds normal.  Ext: No edema  MS: decreased  ROM spine,adequate in  shoulders, hips and knees.  Skin: Intact, no ulcerations or rash noted.  Psych: Good eye contact, normal affect. Memory intact not anxious or depressed appearing.  CNS: CN 2-12 intact, power, tone and sensation normal throughout.        Assessment & Plan:

## 2011-08-08 NOTE — Assessment & Plan Note (Signed)
Controlled, no change in medication  

## 2011-08-08 NOTE — Assessment & Plan Note (Signed)
Controlled on current regime, low fat diet encouraged, lab through dialysis

## 2011-08-08 NOTE — Assessment & Plan Note (Signed)
Pt still need [place] , this will be scheduled

## 2011-08-08 NOTE — Assessment & Plan Note (Signed)
Improved. Pt applauded on succesful weight loss through lifestyle change, and encouraged to continue same. Weight loss goal set for the next several months.  

## 2011-08-09 ENCOUNTER — Other Ambulatory Visit (HOSPITAL_COMMUNITY): Payer: Medicare Other

## 2011-08-10 ENCOUNTER — Ambulatory Visit (HOSPITAL_COMMUNITY)
Admission: RE | Admit: 2011-08-10 | Discharge: 2011-08-10 | Disposition: A | Discharge status: Home / Self Care | Payer: Medicare Other | Source: Ambulatory Visit | Attending: Family Medicine | Admitting: Family Medicine

## 2011-08-10 ENCOUNTER — Other Ambulatory Visit: Payer: Self-pay | Admitting: Family Medicine

## 2011-08-10 DIAGNOSIS — E079 Disorder of thyroid, unspecified: Secondary | ICD-10-CM

## 2011-08-10 DIAGNOSIS — E119 Type 2 diabetes mellitus without complications: Secondary | ICD-10-CM | POA: Insufficient documentation

## 2011-08-10 DIAGNOSIS — E049 Nontoxic goiter, unspecified: Secondary | ICD-10-CM | POA: Insufficient documentation

## 2011-08-10 DIAGNOSIS — I10 Essential (primary) hypertension: Secondary | ICD-10-CM | POA: Insufficient documentation

## 2011-09-02 ENCOUNTER — Ambulatory Visit (INDEPENDENT_AMBULATORY_CARE_PROVIDER_SITE_OTHER): Payer: Medicare Other | Admitting: Otolaryngology

## 2011-09-02 DIAGNOSIS — J343 Hypertrophy of nasal turbinates: Secondary | ICD-10-CM

## 2011-09-02 DIAGNOSIS — D449 Neoplasm of uncertain behavior of unspecified endocrine gland: Secondary | ICD-10-CM

## 2011-09-02 DIAGNOSIS — J310 Chronic rhinitis: Secondary | ICD-10-CM

## 2011-09-03 ENCOUNTER — Ambulatory Visit (INDEPENDENT_AMBULATORY_CARE_PROVIDER_SITE_OTHER): Payer: Medicare Other | Admitting: Family Medicine

## 2011-09-03 ENCOUNTER — Encounter: Payer: Self-pay | Admitting: Family Medicine

## 2011-09-03 VITALS — BP 130/64 | HR 91 | Resp 16 | Ht 74.0 in | Wt 260.8 lb

## 2011-09-03 DIAGNOSIS — N186 End stage renal disease: Secondary | ICD-10-CM

## 2011-09-03 NOTE — Patient Instructions (Signed)
I will attempt to reach the offices you gave me numbers for. When we find out some information we will let you know.

## 2011-09-03 NOTE — Progress Notes (Signed)
  Subjective:    Patient ID: Albert Lewis, male    DOB: [birth year], 60 y.o.   MRN: [id number]  HPI  Pt here because he needs a urine drug test,. But he does not urinate therefore cant give a sample He is trying to work weekends as security ESRD on HD, M,W,F, kidney doctor is Dr. Fausto Skillern He has not made urine in 3 years  Marijean Niemann )Allied [facility]     [address] Laurell Joseph[address]   [phone] Job# [id number]  [facility] [address]. Rosario Jacks [address], [address] [place], [place] [address] [phone] Fax [phone]  Review of Systems- not applicable     Objective:   Physical Exam GEN-NAD, pleasant       Assessment & Plan:   ESRD- pt does not make urine, therefore no UDS, he was told he does not have the option of blood drug testing. I have tried to contact both the [facility] office and [place] office listed above with no answer. I need to clarify who needs a letter to sign off on his exemption letter, which is not clear at all.  Will f/u next week.

## 2011-09-06 ENCOUNTER — Encounter: Payer: Self-pay | Admitting: Family Medicine

## 2011-09-06 ENCOUNTER — Telehealth: Payer: Self-pay | Admitting: Family Medicine

## 2011-09-06 NOTE — Telephone Encounter (Signed)
I spoke with the company, we will fax the letter today.

## 2011-09-08 NOTE — Telephone Encounter (Signed)
CALLED PATIENT, NO ANSWER °

## 2011-09-09 ENCOUNTER — Other Ambulatory Visit (INDEPENDENT_AMBULATORY_CARE_PROVIDER_SITE_OTHER): Payer: Self-pay | Admitting: Otolaryngology

## 2011-09-09 DIAGNOSIS — E042 Nontoxic multinodular goiter: Secondary | ICD-10-CM

## 2011-09-10 LAB — BASIC METABOLIC PANEL
BUN: 27 — ABNORMAL HIGH
CO2: 24
CO2: 25
CO2: 27
Calcium: 7.8 — ABNORMAL LOW
Calcium: 8.7
Chloride: 92 — ABNORMAL LOW
Chloride: 96
Chloride: 98
Creatinine, Ser: 7.62 — ABNORMAL HIGH
GFR calc Af Amer: 6 — ABNORMAL LOW
GFR calc Af Amer: 7 — ABNORMAL LOW
Glucose, Bld: 144 — ABNORMAL HIGH
Glucose, Bld: 82
Glucose, Bld: 225 — ABNORMAL HIGH
Potassium: 3.5
Sodium: 132 — ABNORMAL LOW
Sodium: 133 — ABNORMAL LOW

## 2011-09-10 LAB — APTT: aPTT: 169 — ABNORMAL HIGH

## 2011-09-10 LAB — CBC
HCT: 36.4 — ABNORMAL LOW
Hemoglobin: 11.5 — ABNORMAL LOW
Hemoglobin: 12.1 — ABNORMAL LOW
MCHC: 33.3
MCHC: 33.8
MCHC: 33.7
MCV: 92.1
MCV: 91.8
Platelets: 173
RBC: 3.7 — ABNORMAL LOW
RBC: 3.94 — ABNORMAL LOW
RDW: 14.2
RDW: 14.2
RDW: 14.5

## 2011-09-10 LAB — LIPID PANEL
LDL Cholesterol: 74
Total CHOL/HDL Ratio: 5.2
VLDL: 60 — ABNORMAL HIGH

## 2011-09-10 LAB — B-NATRIURETIC PEPTIDE (CONVERTED LAB): Pro B Natriuretic peptide (BNP): 93

## 2011-09-10 LAB — PROTIME-INR: INR: 1.1

## 2011-09-15 LAB — DIFFERENTIAL
Basophils Absolute: 0
Lymphocytes Relative: 18
Monocytes Absolute: 1
Monocytes Relative: 13 — ABNORMAL HIGH
Neutro Abs: 5.2
Neutrophils Relative %: 69

## 2011-09-15 LAB — CBC
HCT: 34.5 — ABNORMAL LOW
Hemoglobin: 11.7 — ABNORMAL LOW
MCHC: 33.9
MCV: 93.8
Platelets: 147 — ABNORMAL LOW
RDW: 15.2

## 2011-09-15 LAB — IRON AND TIBC
Iron: 64
Iron: 67
Saturation Ratios: 28
Saturation Ratios: 29
TIBC: 230
TIBC: 235
UIBC: 166

## 2011-09-15 LAB — FOLATE: Folate: >20

## 2011-09-15 LAB — APTT: aPTT: 31

## 2011-09-15 LAB — PROTIME-INR: Prothrombin Time: 13.8

## 2011-09-15 LAB — COMPREHENSIVE METABOLIC PANEL
Albumin: 3.1 — ABNORMAL LOW
BUN: 51 — ABNORMAL HIGH
Creatinine, Ser: 12.32 — ABNORMAL HIGH
Glucose, Bld: 233 — ABNORMAL HIGH
Total Protein: 6.3

## 2011-09-15 LAB — FERRITIN: Ferritin: 291 (ref 22–322)

## 2011-09-15 LAB — HEMOGLOBIN AND HEMATOCRIT, BLOOD
HCT: 33.4 — ABNORMAL LOW
Hemoglobin: 12.5 — ABNORMAL LOW

## 2011-09-15 LAB — RETICULOCYTES
Retic Count, Absolute: 153.3
Retic Ct Pct: 4.2 — ABNORMAL HIGH

## 2011-09-16 LAB — CBC
HCT: 41.8
Hemoglobin: 14
WBC: 6.8

## 2011-09-16 LAB — GLUCOSE, RANDOM: Glucose, Bld: 124 — ABNORMAL HIGH

## 2011-09-21 ENCOUNTER — Inpatient Hospital Stay (HOSPITAL_COMMUNITY): Admission: RE | Admit: 2011-09-21 | Discharge: 2011-09-21 | Payer: Medicare Other | Source: Ambulatory Visit

## 2011-09-21 ENCOUNTER — Other Ambulatory Visit (HOSPITAL_COMMUNITY): Payer: Self-pay | Admitting: Otolaryngology

## 2011-09-21 ENCOUNTER — Ambulatory Visit (HOSPITAL_COMMUNITY)
Admission: RE | Admit: 2011-09-21 | Discharge: 2011-09-21 | Disposition: A | Discharge status: Home / Self Care | Payer: Medicare Other | Source: Ambulatory Visit | Attending: Otolaryngology | Admitting: Otolaryngology

## 2011-09-21 DIAGNOSIS — E042 Nontoxic multinodular goiter: Secondary | ICD-10-CM

## 2011-09-21 DIAGNOSIS — E049 Nontoxic goiter, unspecified: Secondary | ICD-10-CM | POA: Insufficient documentation

## 2011-09-21 NOTE — Progress Notes (Signed)
1103- 1107 xylocaine 2% 2ml injected to thyroid nodule right side. Pt tolerated well. 1108 First tissue sample aspirated via needle using aseptic technique. 1110 Second sample aspirated via needle. 1113 Third and final sample aspirated. Samples labelled and sent to lab for pathology.

## 2011-09-21 NOTE — Procedures (Signed)
PreOperative Dx: Complex cystic and solid RIGHT thyroid nodule Postoperative Dx: Complex cystic and solid RIGHT thyroid nodule Procedure:   US guided RIGHT thyroid nodule FNA Radiologist:  Tyron Russell Anesthesia:  3ml of 2% xylocaine Specimen:  FNA x 3 of the mural nodule component of the complex right thyroid nodule EBL:   Minimal Complications: None - small amount of expected intracystic hemorrhage noted at conclusion of exam

## 2011-09-28 LAB — CBC
HCT: 40.4
MCV: 94.4
Platelets: 209
RDW: 15.8 — ABNORMAL HIGH

## 2011-09-28 LAB — DIFFERENTIAL
Basophils Absolute: 0
Eosinophils Absolute: 0 — ABNORMAL LOW
Eosinophils Relative: 0
Lymphs Abs: 0.9

## 2011-09-28 LAB — POCT CARDIAC MARKERS
Myoglobin, poc: >500
Operator id: 179121
Operator id: 179121
Troponin i, poc: <0.05

## 2011-09-28 LAB — BASIC METABOLIC PANEL
BUN: 14
CO2: 33 — ABNORMAL HIGH
Chloride: 101
Glucose, Bld: 126 — ABNORMAL HIGH
Potassium: 3.4 — ABNORMAL LOW

## 2011-10-18 ENCOUNTER — Ambulatory Visit (INDEPENDENT_AMBULATORY_CARE_PROVIDER_SITE_OTHER): Payer: Medicare Other | Admitting: Family Medicine

## 2011-10-18 ENCOUNTER — Encounter: Payer: Self-pay | Admitting: Family Medicine

## 2011-10-18 VITALS — BP 130/60 | HR 83 | Resp 16 | Ht 74.0 in | Wt 270.1 lb

## 2011-10-18 DIAGNOSIS — L0233 Carbuncle of buttock: Secondary | ICD-10-CM

## 2011-10-18 DIAGNOSIS — L0232 Furuncle of buttock: Secondary | ICD-10-CM

## 2011-10-18 MED ORDER — SULFAMETHOXAZOLE-TRIMETHOPRIM 800-160 MG PO TABS: 1.0000 | ORAL_TABLET | Freq: Two times a day (BID) | ORAL | Status: AC

## 2011-10-18 NOTE — Patient Instructions (Signed)
Take the antibiotics as prescribed, 1 tablet twice a day. Sit in warm sitz baths to bring to a head. It may start to drain on its on. If this is getting larger even with the antibiotics- come back in to have it drained.  Abscess An abscess (boil or furuncle) is an infected area that contains a collection of pus.   SYMPTOMS Signs and symptoms of an abscess include pain, tenderness, redness, or hardness. You may feel a moveable soft area under your skin. An abscess can occur anywhere in the body.   TREATMENT   A surgical cut (incision) may be made over your abscess to drain the pus. Gauze may be packed into the space or a drain may be looped through the abscess cavity (pocket). This provides a drain that will allow the cavity to heal from the inside outwards. The abscess may be painful for a few days, but should feel much better if it was drained.   Your abscess, if seen early, may not have localized and may not have been drained. If not, another appointment may be required if it does not get better on its own or with medications. HOME CARE INSTRUCTIONS    Only take over-the-counter or prescription medicines for pain, discomfort, or fever as directed by your caregiver.     Take your antibiotics as directed if they were prescribed. Finish them even if you start to feel better.     Keep the skin and clothes clean around your abscess.     If the abscess was drained, you will need to use gauze dressing to collect any draining pus. Dressings will typically need to be changed 3 or more times a day.     The infection may spread by skin contact with others. Avoid skin contact as much as possible.     Practice good hygiene. This includes regular hand washing, cover any draining skin lesions, and do not share personal care items.     If you participate in sports, do not share athletic equipment, towels, whirlpools, or personal care items. Shower after every practice or tournament.     If a draining  area cannot be adequately covered:     Do not participate in sports.     Children should not participate in day care until the wound has healed or drainage stops.     If your caregiver has given you a follow-up appointment, it is very important to keep that appointment. Not keeping the appointment could result in a much worse infection, chronic or permanent injury, pain, and disability. If there is any problem keeping the appointment, you must call back to this facility for assistance.  SEEK MEDICAL CARE IF:    You develop increased pain, swelling, redness, drainage, or bleeding in the wound site.     You develop signs of generalized infection including muscle aches, chills, fever, or a general ill feeling.     You have an oral temperature above 102 F (38.9 C).  MAKE SURE YOU:    Understand these instructions.     Will watch your condition.     Will get help right away if you are not doing well or get worse.  Document Released: 09/15/2005 Document Revised: 08/18/2011 Document Reviewed: 07/09/2008 [facility] Patient Information 2012 [place], [place].

## 2011-10-18 NOTE — Progress Notes (Signed)
  Subjective:    Patient ID: Albert Lewis, male    DOB: 1951-03-04, 60 y.o.   MRN: [id number]  HPI  Feels like he has a boil on his buttocks. Noticed a tender round spot on Saturday, this has grown in size. History of boil approx 1 year ago. No recent antibiotics ROS- no fever, no chills, occ upset stomach, no rash elsewhere, no sick contacts, no change in bowels, no blood in stool or mucous in stool ESRD on HD   Review of Systems - per above     Objective:   Physical Exam GEN- NAD,alert and oriented, pleasant GU- normal rectal area- no fissures seen, soft brown stool at entrance Skin- small 0.5cm boil felt under skin along mid gluteal cleft- non fluctanct, no drainage, indurated, TTP, no erythema, even smaller lesion felt on right side of gluteal cleft- no open lesions      Assessment & Plan:   Boil/skin abscess- will start Bactrim to cover MRSA, currently not superficial enough to open up and 2nd lesion very small. Warm sitz baths to aide in process. RTC if these do not improve with just antibiotics.

## 2011-11-08 ENCOUNTER — Encounter: Payer: Self-pay | Admitting: Family Medicine

## 2011-11-09 ENCOUNTER — Ambulatory Visit: Payer: Medicare Other | Admitting: Family Medicine

## 2012-01-12 ENCOUNTER — Encounter: Payer: Self-pay | Admitting: Family Medicine

## 2012-01-13 ENCOUNTER — Ambulatory Visit (INDEPENDENT_AMBULATORY_CARE_PROVIDER_SITE_OTHER): Payer: Medicare Other | Admitting: Family Medicine

## 2012-01-13 ENCOUNTER — Encounter: Payer: Self-pay | Admitting: Family Medicine

## 2012-01-13 ENCOUNTER — Telehealth: Payer: Self-pay | Admitting: Family Medicine

## 2012-01-13 VITALS — BP 136/66 | HR 84 | Resp 20 | Ht 74.0 in | Wt 268.0 lb

## 2012-01-13 DIAGNOSIS — M549 Dorsalgia, unspecified: Secondary | ICD-10-CM | POA: Insufficient documentation

## 2012-01-13 DIAGNOSIS — E669 Obesity, unspecified: Secondary | ICD-10-CM

## 2012-01-13 DIAGNOSIS — E119 Type 2 diabetes mellitus without complications: Secondary | ICD-10-CM

## 2012-01-13 DIAGNOSIS — I10 Essential (primary) hypertension: Secondary | ICD-10-CM

## 2012-01-13 MED ORDER — HYDROCODONE-ACETAMINOPHEN 7.5-750 MG PO TABS: 1.0000 | ORAL_TABLET | Freq: Three times a day (TID) | ORAL | Status: AC | PRN

## 2012-01-13 NOTE — Assessment & Plan Note (Signed)
Followed by endo, reports controlled

## 2012-01-13 NOTE — Patient Instructions (Signed)
F/U in 2 month  You are referred for an MRI of your spine, I believe you have a pinched nerve.  Medication is sent in for pain.  You will be referred to a pain specialist after we have the report of the MRI

## 2012-01-13 NOTE — Assessment & Plan Note (Signed)
Controlled, no change in medication  

## 2012-01-13 NOTE — Telephone Encounter (Signed)
I am assuming this is from radiology if so pls verify they need chem 7 , order pls. He may have more recent labs from the dialysis center he goes to or the endocrinologist he sees Let me know if further concerns pls

## 2012-01-17 NOTE — Progress Notes (Signed)
  Subjective:    Patient ID: Albert Lewis, male    DOB: 01/05/1951, 61 y.o.   MRN: [id number]  HPI 6 day h/o right flank pain radiating down right lower extremity, constant, disturbing sleep, associated with feeling of pins and needles in the leg. Pt can identify no aggravating incident that started the pain, he has had back surgery in the past   Review of Systems See HPI Denies recent fever or chills. Denies sinus pressure, nasal congestion, ear pain or sore throat. Denies chest congestion, productive cough or wheezing. Denies chest pains, palpitations and leg swelling Denies abdominal pain, nausea, vomiting,diarrhea or constipation.   Denies dysuria, frequency, hesitancy or incontinence.  Denies headaches, seizures Denies depression, anxiety or insomnia. Denies skin break down or rash.        Objective:   Physical Exam Patient alert and oriented and in no cardiopulmonary distress.  HEENT: No facial asymmetry, EOMI, no sinus tenderness,  oropharynx pink and moist.  Neck supple no adenopathy.  Chest: Clear to auscultation bilaterally.  CVS: S1, S2 no murmurs, no S3.  ABD: Soft non tender. Bowel sounds normal.  Ext: No edema  NW:[id number] ROM spine adequate in shoulders and knees  Skin: Intact, no ulcerations or rash noted.  Psych: Good eye contact, normal affect. Memory intact not anxious or depressed appearing.  CNS: CN 2-12 intact, decreased power and snsation in left lower extremity        Assessment & Plan:

## 2012-01-17 NOTE — Assessment & Plan Note (Signed)
Unchanged, lifestyle change encouraged to facilitate weight loss 

## 2012-01-17 NOTE — Assessment & Plan Note (Signed)
Acute onset of severe back pain radiating to lower ext, has had surgery in the past, suspicious for pinched nerve, will refer for MRI spine and attempt to provide some relief with pain meds

## 2012-01-18 ENCOUNTER — Other Ambulatory Visit: Payer: Self-pay

## 2012-01-18 DIAGNOSIS — M545 Low back pain: Secondary | ICD-10-CM

## 2012-01-18 NOTE — Telephone Encounter (Signed)
Lab ordered.  Attempted to call pt and notify.  Unable to reach. Will make another attempt.

## 2012-01-20 ENCOUNTER — Ambulatory Visit
Admission: RE | Admit: 2012-01-20 | Discharge: 2012-01-20 | Disposition: A | Discharge status: Home / Self Care | Payer: Medicare Other | Source: Ambulatory Visit | Attending: Family Medicine | Admitting: Family Medicine

## 2012-01-20 DIAGNOSIS — M549 Dorsalgia, unspecified: Secondary | ICD-10-CM

## 2012-01-21 ENCOUNTER — Other Ambulatory Visit: Payer: Self-pay | Admitting: Family Medicine

## 2012-01-21 DIAGNOSIS — M549 Dorsalgia, unspecified: Secondary | ICD-10-CM

## 2012-01-24 ENCOUNTER — Encounter: Payer: Self-pay | Admitting: Family Medicine

## 2012-01-26 NOTE — Telephone Encounter (Signed)
Pt already had test done. 

## 2012-01-30 ENCOUNTER — Emergency Department (HOSPITAL_COMMUNITY): Payer: Medicare Other

## 2012-01-30 ENCOUNTER — Emergency Department (HOSPITAL_COMMUNITY)
Admission: EM | Admit: 2012-01-30 | Discharge: 2012-01-30 | Disposition: A | Discharge status: Home / Self Care | Payer: Medicare Other | Attending: Emergency Medicine | Admitting: Emergency Medicine

## 2012-01-30 ENCOUNTER — Encounter (HOSPITAL_COMMUNITY): Payer: Self-pay

## 2012-01-30 DIAGNOSIS — N2581 Secondary hyperparathyroidism of renal origin: Secondary | ICD-10-CM | POA: Insufficient documentation

## 2012-01-30 DIAGNOSIS — IMO0002 Reserved for concepts with insufficient information to code with codable children: Secondary | ICD-10-CM | POA: Insufficient documentation

## 2012-01-30 DIAGNOSIS — E119 Type 2 diabetes mellitus without complications: Secondary | ICD-10-CM | POA: Insufficient documentation

## 2012-01-30 DIAGNOSIS — Z7982 Long term (current) use of aspirin: Secondary | ICD-10-CM | POA: Insufficient documentation

## 2012-01-30 DIAGNOSIS — G8929 Other chronic pain: Secondary | ICD-10-CM | POA: Insufficient documentation

## 2012-01-30 DIAGNOSIS — R1031 Right lower quadrant pain: Secondary | ICD-10-CM | POA: Insufficient documentation

## 2012-01-30 DIAGNOSIS — I120 Hypertensive chronic kidney disease with stage 5 chronic kidney disease or end stage renal disease: Secondary | ICD-10-CM | POA: Insufficient documentation

## 2012-01-30 DIAGNOSIS — I4891 Unspecified atrial fibrillation: Secondary | ICD-10-CM | POA: Insufficient documentation

## 2012-01-30 DIAGNOSIS — Z79899 Other long term (current) drug therapy: Secondary | ICD-10-CM | POA: Insufficient documentation

## 2012-01-30 DIAGNOSIS — E785 Hyperlipidemia, unspecified: Secondary | ICD-10-CM | POA: Insufficient documentation

## 2012-01-30 DIAGNOSIS — K5730 Diverticulosis of large intestine without perforation or abscess without bleeding: Secondary | ICD-10-CM | POA: Insufficient documentation

## 2012-01-30 DIAGNOSIS — E669 Obesity, unspecified: Secondary | ICD-10-CM | POA: Insufficient documentation

## 2012-01-30 DIAGNOSIS — N186 End stage renal disease: Secondary | ICD-10-CM | POA: Insufficient documentation

## 2012-01-30 LAB — COMPREHENSIVE METABOLIC PANEL
Albumin: 3.5 g/dL (ref 3.5–5.2)
Alkaline Phosphatase: 76 U/L (ref 39–117)
BUN: 43 mg/dL — ABNORMAL HIGH (ref 6–23)
CO2: 27 mEq/L (ref 19–32)
Chloride: 98 mEq/L (ref 96–112)
GFR calc Af Amer: 5 mL/min — ABNORMAL LOW (ref >90)
Glucose, Bld: 249 mg/dL — ABNORMAL HIGH (ref 70–99)
Potassium: 4.3 mEq/L (ref 3.5–5.1)
Total Bilirubin: 0.2 mg/dL — ABNORMAL LOW (ref 0.3–1.2)

## 2012-01-30 LAB — CBC
HCT: 33.3 % — ABNORMAL LOW (ref 39.0–52.0)
Hemoglobin: 11.2 g/dL — ABNORMAL LOW (ref 13.0–17.0)
WBC: 5.8 10*3/uL (ref 4.0–10.5)

## 2012-01-30 LAB — DIFFERENTIAL
Basophils Relative: 0 % (ref 0–1)
Eosinophils Relative: 0 % (ref 0–5)
Lymphocytes Relative: 25 % (ref 12–46)
Monocytes Relative: 12 % (ref 3–12)
Neutro Abs: 3.6 10*3/uL (ref 1.7–7.7)

## 2012-01-30 LAB — LIPASE, BLOOD: Lipase: 116 U/L — ABNORMAL HIGH (ref 11–59)

## 2012-01-30 MED ORDER — SODIUM CHLORIDE 0.9 % IV BOLUS (SEPSIS)
500.0000 mL | Freq: Once | INTRAVENOUS | Status: AC
  Administered 2012-01-30: 500 mL via INTRAVENOUS

## 2012-01-30 MED ORDER — OXYCODONE-ACETAMINOPHEN 5-325 MG PO TABS: 2.0000 | ORAL_TABLET | ORAL | Status: AC | PRN

## 2012-01-30 MED ORDER — ONDANSETRON HCL 4 MG PO TABS: 4.0000 mg | ORAL_TABLET | Freq: Four times a day (QID) | ORAL | Status: AC

## 2012-01-30 MED ORDER — MORPHINE SULFATE 4 MG/ML IJ SOLN
4.0000 mg | Freq: Once | INTRAMUSCULAR | Status: AC
  Administered 2012-01-30: 4 mg via INTRAVENOUS
  Filled 2012-01-30: qty 1

## 2012-01-30 MED ORDER — IOHEXOL 300 MG/ML  SOLN
40.0000 mL | Freq: Once | INTRAMUSCULAR | Status: AC | PRN
  Administered 2012-01-30: 40 mL via ORAL

## 2012-01-30 MED ORDER — AZITHROMYCIN 500 MG IV SOLR
INTRAVENOUS | Status: AC
  Filled 2012-01-30: qty 500

## 2012-01-30 MED ORDER — ONDANSETRON HCL 4 MG/2ML IJ SOLN
4.0000 mg | Freq: Once | INTRAMUSCULAR | Status: AC
  Administered 2012-01-30: 4 mg via INTRAVENOUS
  Filled 2012-01-30: qty 2

## 2012-01-30 MED ORDER — MORPHINE SULFATE 4 MG/ML IJ SOLN
4.0000 mg | Freq: Once | INTRAMUSCULAR | Status: DC
  Filled 2012-01-30: qty 1

## 2012-01-30 NOTE — ED Provider Notes (Addendum)
History  This chart was scribed for Albert Racer, MD by Bennett Scrape. This patient was seen in room APA11/APA11 and the patient's care was started at 3:29PM.  CSN: [id number]  Arrival date & time 01/30/12  1257   First MD Initiated Contact with Patient 01/30/12 1525      Chief Complaint  Patient presents with  . Flank Pain  . Abdominal Pain    Patient is a 61 y.o. male presenting with flank pain. The history is provided by the patient. No language interpreter was used.  Flank Pain This is a new problem. The current episode started more than 2 days ago. The problem occurs constantly. The problem has been gradually worsening. Associated symptoms include abdominal pain. Pertinent negatives include no chest pain, no headaches and no shortness of breath.    Albert Lewis is a 61 y.o. male who presents to the Emergency Department complaining of one week of gradual onset, gradually worsening, constant right flank pain described as dull that radiates to the RUQ. The pain is worse with deep breathes. He denies taking any medication to improve his symptoms. He denies any previous episodes of similar pain.  He denies fever, chills He states that he has been a dialysis pt for the past 4 years with appointments on Monday, Wednesday, and Friday. He denies missing any recent dialysis appointments. He denies any recent hospitalizations or recent antibiotic use.  He also has a h/o hyperlipidemia, HTN, and diabetes. He is a former smoker and denies alcohol use.   Past Medical History  Diagnosis Date  . Paroxysmal atrial fibrillation 2/09    Abnormal stress nuclear study in 01/2008; normal coronary angiography  . Hyperlipidemia 2008  . Hypertension 1993    Mild to moderate LVH  . Diabetes mellitus, type 2 1993    no insulin  . ESRD (end stage renal disease) on dialysis     Dialysis since 2007  . Obesity     sleep apnea treated with BiPAP  . Anemia   . Gastroesophageal reflux disease   .  Hyperparathyroidism     Secondary  . Allergic rhinitis   . Lower gastrointestinal bleed 04/2008    diverticulosis; hemorrhoids; constipation  . Diverticular disease   . Chronic back pain     Degenerative disc disease    Past Surgical History  Procedure Date  . Cholecystectomy 1999  . Exploratory laparotomy     shunt complications  . Cataract extraction w/ intraocular lens  implant, bilateral 2008  . Lumbar peritoneal shunt     Family History  Problem Relation Age of Onset  . Cancer Mother   . Hypertension Mother   . Hypertension Sister 26  . Hypertension Daughter 30  . Diabetes Daughter     History  Substance Use Topics  . Smoking status: Former Smoker    Types: Cigarettes  . Smokeless tobacco: Not on file  . Alcohol Use: No      Review of Systems  Constitutional: Negative for fever and chills.  HENT: Negative for congestion, sore throat and neck pain.   Eyes: Negative for pain.  Respiratory: Negative for cough and shortness of breath.   Cardiovascular: Negative for chest pain.  Gastrointestinal: Positive for abdominal pain. Negative for nausea and vomiting.  Genitourinary: Positive for flank pain (Right sided). Negative for dysuria, frequency and hematuria.  Musculoskeletal: Positive for back pain (chronic).  Skin: Negative for rash.  Neurological: Negative for numbness and headaches.    Allergies  Penicillins  Home Medications   Current Outpatient Rx  Name Route Sig Dispense Refill  . ALLOPURINOL 100 MG PO TABS Oral Take 100 mg by mouth daily.     . AMIODARONE HCL 200 MG PO TABS Oral Take 200 mg by mouth daily.      Marland Kitchen AMLODIPINE BESYLATE 10 MG PO TABS Oral Take 10 mg by mouth daily.     . ASPIRIN 81 MG PO CHEW Oral Chew 81 mg by mouth daily. One by mouth daily     . NEPHRO-VITE PO Oral Take 1 mg by mouth.      Marland Kitchen CINACALCET HCL 30 MG PO TABS Oral Take 30 mg by mouth daily.     Marland Kitchen FLUTICASONE PROPIONATE 50 MCG/ACT NA SUSP Nasal 1 spray by Nasal route  daily. One puff per nostril daily for allergies      . GABAPENTIN 300 MG PO CAPS Oral Take 300 mg by mouth 2 (two) times daily.     Marland Kitchen GEMFIBROZIL 600 MG PO TABS Oral Take 600 mg by mouth 2 (two) times daily before a meal.      . GLIPIZIDE 5 MG PO TABS Oral Take 5 mg by mouth 2 (two) times daily.    Marland Kitchen HYDROCODONE-ACETAMINOPHEN 7.5-750 MG PO TABS Oral Take 1 tablet by mouth every 8 (eight) hours as needed for pain. 40 tablet 1  . LORATADINE 10 MG PO TABS Oral Take 10 mg by mouth daily.      Marland Kitchen LOSARTAN POTASSIUM 100 MG PO TABS Oral Take 100 mg by mouth daily. Take one tablet by mouth once a day     . SEA-OMEGA PO Oral Take 300 mg by mouth 3 (three) times daily. Take two tablets by mouth three times a day     . PANTOPRAZOLE SODIUM 40 MG PO TBEC Oral Take 1 tablet (40 mg total) by mouth daily. 90 tablet 3    pls dispense 3 month supply each time  . PENTOXIFYLLINE 400 MG PO TBCR Oral Take 400 mg by mouth 3 (three) times daily with meals.     . SENNOSIDES 8.6 MG PO TABS Oral Take 1 tablet by mouth daily.     Marland Kitchen SEVELAMER CARBONATE 800 MG PO TABS Oral Take 800 mg by mouth 3 (three) times daily with meals. Take five tablets three times a day before meals       . ONDANSETRON HCL 4 MG PO TABS Oral Take 1 tablet (4 mg total) by mouth every 6 (six) hours. 12 tablet 0  . OXYCODONE-ACETAMINOPHEN 5-325 MG PO TABS Oral Take 2 tablets by mouth every 4 (four) hours as needed for pain. 15 tablet 0    Triage Vitals: BP 148/50  Pulse 88  Temp(Src) 99 F (37.2 C) (Oral)  Resp 20  Ht 6\' 2"  (1.88 m)  Wt 253 lb (114.76 kg)  BMI 32.48 kg/m2  Physical Exam  Nursing note and vitals reviewed. Constitutional: He is oriented to person, place, and time. He appears well-developed and well-nourished. No distress.  HENT:  Head: Normocephalic and atraumatic.  Mouth/Throat: Oropharynx is clear and moist.       Mucous membranes moist  Eyes: EOM are normal. Pupils are equal, round, and reactive to light.  Neck: Normal  range of motion. Neck supple. No tracheal deviation present.  Cardiovascular: Normal rate, regular rhythm and normal heart sounds.  Exam reveals no gallop and no friction rub.   No murmur heard. Pulmonary/Chest: Effort normal and breath sounds normal. No stridor. No  respiratory distress. He has no wheezes. He has no rales. He exhibits no tenderness.  Abdominal: Soft. He exhibits no distension. There is tenderness (Mild RLQ tenderness). There is no rebound and no guarding.  Musculoskeletal: Normal range of motion. He exhibits tenderness. He exhibits no edema.       Right flank tenderness to percussion, Palpable shunt in RUE, no calf tenderness, no pitting edema  Neurological: He is alert and oriented to person, place, and time. No cranial nerve deficit.       Moving all extremities, sensation is grossly intact  Skin: Skin is warm and dry. No rash noted.  Psychiatric: He has a normal mood and affect. His behavior is normal.    ED Course  Procedures (including critical care time)  DIAGNOSTIC STUDIES: Oxygen Saturation is 97% on room air, adequate by my interpretation.    COORDINATION OF CARE: 3:35PM-Discussed treatment plan with pt at bedside and pt agreed to plan.   Labs Reviewed  CBC - Abnormal; Notable for the following:    RBC 3.44 (*)    Hemoglobin 11.2 (*)    HCT 33.3 (*)    All other components within normal limits  COMPREHENSIVE METABOLIC PANEL - Abnormal; Notable for the following:    Glucose, Bld 249 (*)    BUN 43 (*)    Creatinine, Ser 10.38 (*)    Calcium 10.8 (*)    Total Bilirubin 0.2 (*)    GFR calc non Af Amer 5 (*)    GFR calc Af Amer 5 (*)    All other components within normal limits  LIPASE, BLOOD - Abnormal; Notable for the following:    Lipase 116 (*)    All other components within normal limits  DIFFERENTIAL   Ct Abdomen Pelvis Wo Contrast  01/30/2012  *RADIOLOGY REPORT*  Clinical Data: Flank pain.  Abdominal pain.  Right-sided abdominal pain.  End-stage  renal disease on dialysis.  CT ABDOMEN AND PELVIS WITHOUT CONTRAST  Technique:  Multidetector CT imaging of the abdomen and pelvis was performed following the standard protocol without intravenous contrast.  Comparison: CT abdomen and pelvis without, as 11/26/2010.  Findings: The lung bases are clear without focal nodule, mass, or airspace disease.  The heart is upper limits of normal.  There is no edema or effusion to suggest failure.  No significant pericardial effusions are present.  The stomach, duodenum, and pancreas are within normal limits.  The liver and spleen are unremarkable.  Slight nodularity of the adrenal glands bilaterally is stable.  Multiple cysts within both kidneys is compatible with chronic dialysis.  There are scattered calcifications as well.  No enlarging or focal dominant mass lesions are present.  Diverticular changes are present within the proximal sigmoid colon. The remainder of the colon is unremarkable.  No focal inflammatory changes are evident to suggest diverticulitis.  The appendix is visualized and normal.  The small bowel is unremarkable. Atherosclerotic calcifications are present in the abdominal aorta. There is no significant adenopathy or free air.  The urinary bladder is mostly collapsed.  Nodularity of the seminal vesicles is again seen.  Fat herniating into the inguinal canals is stable.  The bone windows demonstrate no focal lytic or blastic lesion. There is anterior fusion of the lower thoracic spine.  The SI joints are fused bilaterally.  IMPRESSION:  1.  No acute or focal abnormality to explain the patient's symptoms. 2.  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 3.  Atherosclerosis. 4.  Stable bilateral  inguinal hernias, containing fat, but no bowel. 5.  Anterior fusion of the lower thoracic spine. 6.  Bilateral SI fusion.  Original Report Authenticated By: Jamesetta Orleans. MATTERN, M.D.     1. Abdominal pain       MDM   Pain improved with pain meds.  Explain neg results and re-examined. Pt pain seems to related to abdominal wall. Abd soft. Do not suspect serious cause for pt's pain. Will treat symptomatically and have f/u with PCP      I personally performed the services described in this documentation, which was scribed in my presence. The recorded information has been reviewed and considered.     Albert Racer, MD 01/30/12 2130  Albert Racer, MD 01/30/12 2130

## 2012-01-30 NOTE — ED Notes (Signed)
Pt c/o abd pain, diarrhea that started last week, EDP in prior to RN, see  EDP assessment for further .

## 2012-01-30 NOTE — ED Notes (Signed)
Pt presents with right sided kidney pain that radiates to right aide of abdomen. Pt states he is a dialysis pt on M,W,F

## 2012-02-16 ENCOUNTER — Encounter: Payer: Self-pay | Admitting: Family Medicine

## 2012-02-16 ENCOUNTER — Ambulatory Visit (INDEPENDENT_AMBULATORY_CARE_PROVIDER_SITE_OTHER): Payer: Medicare Other | Admitting: Family Medicine

## 2012-02-16 VITALS — BP 120/60 | HR 86 | Resp 17 | Ht 74.0 in | Wt 261.1 lb

## 2012-02-16 DIAGNOSIS — K529 Noninfective gastroenteritis and colitis, unspecified: Secondary | ICD-10-CM

## 2012-02-16 DIAGNOSIS — I10 Essential (primary) hypertension: Secondary | ICD-10-CM

## 2012-02-16 DIAGNOSIS — J209 Acute bronchitis, unspecified: Secondary | ICD-10-CM

## 2012-02-16 DIAGNOSIS — E119 Type 2 diabetes mellitus without complications: Secondary | ICD-10-CM

## 2012-02-16 DIAGNOSIS — K5289 Other specified noninfective gastroenteritis and colitis: Secondary | ICD-10-CM

## 2012-02-16 DIAGNOSIS — M545 Low back pain, unspecified: Secondary | ICD-10-CM

## 2012-02-16 MED ORDER — BENZONATATE 100 MG PO CAPS: 100.0000 mg | ORAL_CAPSULE | Freq: Four times a day (QID) | ORAL | Status: DC | PRN

## 2012-02-16 MED ORDER — DIPHENOXYLATE-ATROPINE 2.5-0.025 MG PO TABS: 1.0000 | ORAL_TABLET | Freq: Four times a day (QID) | ORAL | Status: DC | PRN

## 2012-02-16 MED ORDER — SULFAMETHOXAZOLE-TRIMETHOPRIM 800-160 MG PO TABS: 1.0000 | ORAL_TABLET | Freq: Two times a day (BID) | ORAL | Status: AC

## 2012-02-16 NOTE — Progress Notes (Signed)
  Subjective:    Patient ID: Albert Lewis, male    DOB: 1951-01-27, 61 y.o.   MRN: [id number]  HPI 4 day h/o diarrhea with abd cramps, did not get epidural  injection this week due to this. 1 week h/o chest congestion and cough productive of brown sputum, he has also had chills, but no documented fever   Review of Systems See HPI Denies sinus pressure, nasal congestion, ear pain or sore throat.  Denies chest pains, palpitations and leg swelling Chronic back pain and limitation in mobility. Denies headaches, seizures, numbness, or tingling. Denies depression, anxiety or insomnia. Denies skin break down or rash.        Objective:   Physical Exam Patient alert and oriented and in no cardiopulmonary distress.  HEENT: No facial asymmetry, EOMI, no sinus tenderness,  oropharynx pink and moist.  Neck supple no adenopathy.  Chest: decreased air entry and scattered crackles, no wheezes  CVS: S1, S2 no murmurs, no S3.  ABD: Soft non tender. Bowel sounds normal.  Ext: No edema  MS: decreased ROM spine, shoulders, hips and knees.  Skin: Intact, no ulcerations or rash noted.  Psych: Good eye contact, normal affect. Memory intact not anxious or depressed appearing.  CNS: CN 2-12 intact, power, tone and sensation normal throughout.        Assessment & Plan:

## 2012-02-16 NOTE — Patient Instructions (Addendum)
F/u in 4 month  You are being treated for acute bronchitis, and diarrehea  You will get information on gastroenteritis  Viral Gastroenteritis Viral gastroenteritis is also known as stomach flu. This condition affects the stomach and intestinal tract. The illness typically lasts 3 to 8 days. Most people develop an immune response. This eventually gets rid of the virus. While this natural response develops, the virus can make you quite ill.  CAUSES  Diarrhea and vomiting are often caused by a virus. Medicines (antibiotics) that kill germs will not help unless there is also a germ (bacterial) infection. SYMPTOMS  The most common symptom is diarrhea. This can cause severe loss of fluids (dehydration) and body salt (electrolyte) imbalance. TREATMENT  Treatments for this illness are aimed at rehydration. Antidiarrheal medicines are not recommended. They do not decrease diarrhea volume and may be harmful. Usually, home treatment is all that is needed. The most serious cases involve vomiting so severely that you are not able to keep down fluids taken by mouth (orally). In these cases, intravenous (IV) fluids are needed. Vomiting with viral gastroenteritis is common, but it will usually go away with treatment. HOME CARE INSTRUCTIONS  Small amounts of fluids should be taken frequently. Large amounts at one time may not be tolerated. Plain water may be harmful in infants and the elderly. Oral rehydration solutions (ORS) are available at pharmacies and grocery stores. ORS replace water and important electrolytes in proper proportions. Sports drinks are not as effective as ORS and may be harmful due to sugars worsening diarrhea.  As a general guideline for children, replace any new fluid losses from diarrhea or vomiting with ORS as follows:   If your child weighs 22 pounds or under (10 kg or less), give 60-120 mL (1/4 - 1/2 cup or 2 - 4 ounces) of ORS for each diarrheal stool or vomiting episode.   If  your child weighs more than 22 pounds (more than 10 kgs), give 120-240 mL (1/2 - 1 cup or 4 - 8 ounces) of ORS for each diarrheal stool or vomiting episode.   In a child with vomiting, it may be helpful to give the above ORS replacement in 5 mL (1 teaspoon) amounts every 5 minutes, then increase as tolerated.   While correcting for dehydration, children should eat normally. However, foods high in sugar should be avoided because this may worsen diarrhea. Large amounts of carbonated soft drinks, juice, gelatin desserts, and other highly sugared drinks should be avoided.   After correction of dehydration, other liquids that are appealing to the child may be added. Children should drink small amounts of fluids frequently and fluids should be increased as tolerated.   Adults should eat normally while drinking more fluids than usual. Drink small amounts of fluids frequently and increase as tolerated. Drink enough water and fluids to keep your urine clear or pale yellow. Broths, weak decaffeinated tea, lemon-lime soft drinks (allowed to go flat), and ORS replace fluids and electrolytes.   Avoid:   Carbonated drinks.   Juice.   Extremely hot or cold fluids.   Caffeine drinks.   Fatty, greasy foods.   Alcohol.   Tobacco.   Too much intake of anything at one time.   Gelatin desserts.   Probiotics are active cultures of beneficial bacteria. They may lessen the amount and number of diarrheal stools in adults. Probiotics can be found in yogurt with active cultures and in supplements.   Wash your hands well to avoid spreading bacteria  and viruses.   Antidiarrheal medicines are not recommended for infants and children.   Only take over-the-counter or prescription medicines for pain, discomfort, or fever as directed by your caregiver. Do not give aspirin to children.   For adults with dehydration, ask your caregiver if you should continue all prescribed and over-the-counter medicines.   If  your caregiver has given you a follow-up appointment, it is very important to keep that appointment. Not keeping the appointment could result in a lasting (chronic) or permanent injury and disability. If there is any problem keeping the appointment, you must call to reschedule.  SEEK IMMEDIATE MEDICAL CARE IF:   You are unable to keep fluids down.   There is no urine output in 6 to 8 hours or there is only a small amount of very dark urine.   You develop shortness of breath.   There is blood in the vomit (may look like coffee grounds) or stool.   Belly (abdominal) pain develops, increases, or localizes.   There is persistent vomiting or diarrhea.   You have a fever.   Your baby is older than 3 months with a rectal temperature of 102 F (38.9 C) or higher.   Your baby is 31 months old or younger with a rectal temperature of 100.4 F (38 C) or higher.  MAKE SURE YOU:   Understand these instructions.   Will watch your condition.   Will get help right away if you are not doing well or get worse.  Document Released: 12/06/2005 Document Revised: 08/18/2011 Document Reviewed: 04/19/2007 [facility] Patient Information 2012 [place], [place].

## 2012-02-27 NOTE — Assessment & Plan Note (Signed)
Controlled, no change in medication  

## 2012-02-27 NOTE — Assessment & Plan Note (Signed)
Not clinically dehydrated. Pt educated about the illness and steps to take to reduce spread and risk of dehydration

## 2012-02-27 NOTE — Assessment & Plan Note (Signed)
Controlled, no change in medication Followed by endocrine, has had severe hypoglycemia in the past

## 2012-02-27 NOTE — Assessment & Plan Note (Signed)
Acute illness, antibiotics and decongestants prescribed

## 2012-02-27 NOTE — Assessment & Plan Note (Signed)
Recently evaluated by pain specialist and should be getting an epidural once he is over the  Acute illness

## 2012-03-04 ENCOUNTER — Other Ambulatory Visit: Payer: Self-pay | Admitting: Family Medicine

## 2012-03-14 ENCOUNTER — Ambulatory Visit: Payer: Medicare Other | Admitting: Family Medicine

## 2012-04-11 DIAGNOSIS — R079 Chest pain, unspecified: Secondary | ICD-10-CM

## 2012-04-12 ENCOUNTER — Encounter (HOSPITAL_COMMUNITY): Payer: Self-pay | Admitting: *Deleted

## 2012-04-12 ENCOUNTER — Emergency Department (HOSPITAL_COMMUNITY)
Admission: EM | Admit: 2012-04-12 | Discharge: 2012-04-12 | Disposition: A | Discharge status: Home / Self Care | Payer: Medicare Other | Attending: Emergency Medicine | Admitting: Emergency Medicine

## 2012-04-12 ENCOUNTER — Emergency Department (HOSPITAL_COMMUNITY): Payer: Medicare Other

## 2012-04-12 DIAGNOSIS — N186 End stage renal disease: Secondary | ICD-10-CM | POA: Insufficient documentation

## 2012-04-12 DIAGNOSIS — E119 Type 2 diabetes mellitus without complications: Secondary | ICD-10-CM | POA: Insufficient documentation

## 2012-04-12 DIAGNOSIS — E785 Hyperlipidemia, unspecified: Secondary | ICD-10-CM | POA: Insufficient documentation

## 2012-04-12 DIAGNOSIS — Z992 Dependence on renal dialysis: Secondary | ICD-10-CM | POA: Insufficient documentation

## 2012-04-12 DIAGNOSIS — I120 Hypertensive chronic kidney disease with stage 5 chronic kidney disease or end stage renal disease: Secondary | ICD-10-CM | POA: Insufficient documentation

## 2012-04-12 DIAGNOSIS — I4891 Unspecified atrial fibrillation: Secondary | ICD-10-CM | POA: Insufficient documentation

## 2012-04-12 DIAGNOSIS — R079 Chest pain, unspecified: Secondary | ICD-10-CM | POA: Insufficient documentation

## 2012-04-12 DIAGNOSIS — R002 Palpitations: Secondary | ICD-10-CM | POA: Insufficient documentation

## 2012-04-12 LAB — CBC
Hemoglobin: 11.5 g/dL — ABNORMAL LOW (ref 13.0–17.0)
RBC: 3.59 MIL/uL — ABNORMAL LOW (ref 4.22–5.81)

## 2012-04-12 LAB — BASIC METABOLIC PANEL
CO2: 31 mEq/L (ref 19–32)
GFR calc non Af Amer: 8 mL/min — ABNORMAL LOW (ref >90)
Glucose, Bld: 267 mg/dL — ABNORMAL HIGH (ref 70–99)
Potassium: 3.9 mEq/L (ref 3.5–5.1)
Sodium: 137 mEq/L (ref 135–145)

## 2012-04-12 LAB — TROPONIN I: Troponin I: <0.3 ng/mL (ref <0.30)

## 2012-04-12 NOTE — ED Notes (Signed)
Dull, non radiating pain to center chest. Pt states pain began while finishing up dialysis. Pt was able to complete dialysis. Pain is much worse with palpation.

## 2012-04-12 NOTE — ED Provider Notes (Signed)
History     CSN: [id number]  Arrival date & time 04/12/12  1412   First MD Initiated Contact with Patient 04/12/12 1430      Chief Complaint  Patient presents with  . Chest Pain    (Consider location/radiation/quality/duration/timing/severity/associated sxs/prior treatment) HPI  Albert Lewis is a 61 y.o. male history of diabetes, hypertension, and end-stage renal disease who presents to the Emergency Department complaining of  sudden onset of a rapid heart rate after returning home from dialysis today. He palpated his heart rate at about 150. He has no history of palpitations or heart disease. It was accompanied by some central chest pain. Since arriving in the emergency room and lying on the gurney his heart rate has returned to a normal rate in his chest discomfort is gone. He denies any recent illness, cough, shortness of breath, nausea, vomiting, diarrhea. He is an end-stage renal patient  5 years on dialysis Monday Wednesdays and Fridays.  PCP  Dr. Lodema Hong  Past Medical History  Diagnosis Date  . Paroxysmal atrial fibrillation 2/09    Abnormal stress nuclear study in 01/2008; normal coronary angiography  . Hyperlipidemia 2008  . Hypertension 1993    Mild to moderate LVH  . Diabetes mellitus, type 2 1993    no insulin  . ESRD (end stage renal disease) on dialysis     Dialysis since 2007  . Obesity     sleep apnea treated with BiPAP  . Anemia   . Gastroesophageal reflux disease   . Hyperparathyroidism     Secondary  . Allergic rhinitis   . Lower gastrointestinal bleed 04/2008    diverticulosis; hemorrhoids; constipation  . Diverticular disease   . Chronic back pain     Degenerative disc disease    Past Surgical History  Procedure Date  . Cholecystectomy 1999  . Exploratory laparotomy     shunt complications  . Cataract extraction w/ intraocular lens  implant, bilateral 2008  . Lumbar peritoneal shunt     Family History  Problem Relation Age of Onset  .  Cancer Mother   . Hypertension Mother   . Hypertension Sister 84  . Hypertension Daughter 30  . Diabetes Daughter     History  Substance Use Topics  . Smoking status: Former Smoker    Types: Cigarettes  . Smokeless tobacco: Not on file  . Alcohol Use: No      Review of Systems  Constitutional: Negative for fever.       10 Systems reviewed and are negative for acute change except as noted in the HPI.  HENT: Negative for congestion.   Eyes: Negative for discharge and redness.  Respiratory: Negative for cough and shortness of breath.   Cardiovascular: Positive for chest pain and palpitations.  Gastrointestinal: Negative for vomiting and abdominal pain.  Musculoskeletal: Negative for back pain.  Skin: Negative for rash.  Neurological: Negative for syncope, numbness and headaches.  Psychiatric/Behavioral:       No behavior change.    Allergies  Penicillins  Home Medications   Current Outpatient Rx  Name Route Sig Dispense Refill  . ALLOPURINOL 100 MG PO TABS Oral Take 100 mg by mouth daily.     . AMIODARONE HCL 200 MG PO TABS Oral Take 200 mg by mouth daily.      Marland Kitchen AMLODIPINE BESYLATE 10 MG PO TABS Oral Take 10 mg by mouth daily.     . ASPIRIN EC 81 MG PO TBEC Oral Take 81 mg by  mouth daily.    Marland Kitchen NEPHRO-VITE PO Oral Take 1 mg by mouth daily.     Marland Kitchen CINACALCET HCL 30 MG PO TABS Oral Take 30 mg by mouth daily.     . OMEGA-3 FATTY ACIDS 1000 MG PO CAPS Oral Take 1 g by mouth 3 (three) times daily.    Marland Kitchen FLUTICASONE PROPIONATE 50 MCG/ACT NA SUSP Nasal Place 1 spray into the nose daily as needed. One puff per nostril daily for allergies     . GABAPENTIN 300 MG PO CAPS Oral Take 300 mg by mouth 2 (two) times daily.     Marland Kitchen GEMFIBROZIL 600 MG PO TABS Oral Take 600 mg by mouth 2 (two) times daily.    Marland Kitchen GLIPIZIDE 5 MG PO TABS Oral Take 5 mg by mouth 2 (two) times daily.    Marland Kitchen HYDROCODONE-ACETAMINOPHEN 7.5-750 MG PO TABS Oral Take 1 tablet by mouth every 8 (eight) hours as needed for  pain. 40 tablet 1  . LORATADINE 10 MG PO TABS Oral Take 10 mg by mouth daily as needed. Allergies    . LOSARTAN POTASSIUM 100 MG PO TABS Oral Take 100 mg by mouth daily.     Marland Kitchen PANTOPRAZOLE SODIUM 40 MG PO TBEC Oral Take 1 tablet (40 mg total) by mouth daily. 90 tablet 3    pls dispense 3 month supply each time  . PENTOXIFYLLINE ER 400 MG PO TBCR Oral Take 400 mg by mouth 3 (three) times daily.    . SENNOSIDES 8.6 MG PO TABS Oral Take 1 tablet by mouth daily.     Marland Kitchen SEVELAMER CARBONATE 800 MG PO TABS Oral Take 4,000 mg by mouth 3 (three) times daily with meals. Take five tablets three times a day before meals        BP 114/60  Pulse 106  Temp(Src) 99.2 F (37.3 C) (Oral)  Resp 16  SpO2 95%  Physical Exam  Nursing note and vitals reviewed. Constitutional:       Awake, alert, nontoxic appearance.  HENT:  Head: Atraumatic.  Eyes: Right eye exhibits no discharge. Left eye exhibits no discharge.  Neck: Neck supple.  Cardiovascular: Normal rate and intact distal pulses.  Exam reveals no gallop and no friction rub.   No murmur heard. Pulmonary/Chest: Effort normal and breath sounds normal. No respiratory distress. He has no wheezes. He exhibits no tenderness.  Abdominal: Soft. There is no tenderness. There is no rebound.  Musculoskeletal: He exhibits no tenderness.       Baseline ROM, no obvious new focal weakness. AV graft to left forearm with good bruit and thrill  Neurological:       Mental status and motor strength appears baseline for patient and situation.  Skin: No rash noted.  Psychiatric: He has a normal mood and affect.    ED Course  Procedures (including critical care time)   Date: 04/12/2012  1417  Rate: 104  Rhythm: sinus tachycardia  QRS Axis: normal  Intervals: normal  ST/T Wave abnormalities: normal  Conduction Disutrbances:first-degree A-V block   Narrative Interpretation:   Old EKG Reviewed: unchanged c/w 01/06/2010  Results for orders placed during the  hospital encounter of 04/12/12  CBC      Component Value Range   WBC 5.8  4.0 - 10.5 (K/uL)   RBC 3.59 (*) 4.22 - 5.81 (MIL/uL)   Hemoglobin 11.5 (*) 13.0 - 17.0 (g/dL)   HCT 40.9 (*) 81.1 - 52.0 (%)   MCV 95.5  78.0 - 100.0 (  fL)   MCH 32.0  26.0 - 34.0 (pg)   MCHC 33.5  30.0 - 36.0 (g/dL)   RDW 16.1  09.6 - 04.5 (%)   Platelets 160  150 - 400 (K/uL)  BASIC METABOLIC PANEL      Component Value Range   Sodium 137  135 - 145 (mEq/L)   Potassium 3.9  3.5 - 5.1 (mEq/L)   Chloride 94 (*) 96 - 112 (mEq/L)   CO2 31  19 - 32 (mEq/L)   Glucose, Bld 267 (*) 70 - 99 (mg/dL)   BUN 18  6 - 23 (mg/dL)   Creatinine, Ser 4.09 (*) 0.50 - 1.35 (mg/dL)   Calcium 9.8  8.4 - 81.1 (mg/dL)   GFR calc non Af Amer 8 (*) >90 (mL/min)   GFR calc Af Amer 9 (*) >90 (mL/min)  TROPONIN I      Component Value Range   Troponin I <0.30  <0.30 (ng/mL)   Dg Chest 2 View  04/12/2012  *RADIOLOGY REPORT*  Clinical Data: Tachycardia.  Chest pain.  Dialysis patient.  CHEST - 2 VIEW  Comparison: 09/17/2010  Findings: Heart size is normal.  Mediastinal shadows are normal. The lungs are clear.  The vascularity is normal.  Chronic degenerative changes effect the spine.  IMPRESSION: No active cardiopulmonary disease.  Original Report Authenticated By: Thomasenia Sales, M.D.     MDM  Patient with hypertension, diabetes, end-stage renal disease she with an episode of rapid heart rate prior to arrival.labs unremarkable. They show his stable renal disease. Troponin is negative. Heart rate has remained below 100 since arrival. He's had no further chest pain. He will follow up with his primary care physician.Pt stable in ED with no significant deterioration in condition.The patient appears reasonably screened and/or stabilized for discharge and I doubt any other medical condition or other [facility] requiring further screening, evaluation, or treatment in the ED at this time prior to discharge.  MDM Reviewed: nursing note and  vitals Interpretation: labs, ECG and x-ray           Nicoletta Dress. Colon Branch, MD 04/12/12 1734

## 2012-04-12 NOTE — Discharge Instructions (Signed)
Your bloodwork, heart number , EKG, and chest x-ray were normal here today.her heart rate has been normal.drink some fluids at home. If you have any recurrence of a high heart rate, return to the emergency room. You may follow up with Dr. Lodema Hong. Continue your home medicines.

## 2012-06-01 ENCOUNTER — Encounter: Payer: Self-pay | Admitting: Adult Health

## 2012-06-01 ENCOUNTER — Ambulatory Visit (INDEPENDENT_AMBULATORY_CARE_PROVIDER_SITE_OTHER): Payer: Medicare Other | Admitting: Adult Health

## 2012-06-01 VITALS — BP 122/62 | HR 70 | Resp 16 | Ht 74.0 in | Wt 264.0 lb

## 2012-06-01 DIAGNOSIS — E785 Hyperlipidemia, unspecified: Secondary | ICD-10-CM

## 2012-06-01 DIAGNOSIS — I4891 Unspecified atrial fibrillation: Secondary | ICD-10-CM

## 2012-06-01 DIAGNOSIS — I10 Essential (primary) hypertension: Secondary | ICD-10-CM

## 2012-06-01 NOTE — Assessment & Plan Note (Addendum)
Currently in NSR. Continue lopressor at this time. Review of recent echo demonstrates normal EF of 65% with diastolic dysfunction.

## 2012-06-01 NOTE — Assessment & Plan Note (Signed)
It is important that his HR be controlled with PAF. Lopressor dose is necessary for this. He is off of Norvasc at present with normal BP. It is possible that his dry wt needs to be adjusted and less fluid needs to be removed during dialysis, with more filtering only. Will not adjust medications at this time as he is doing well on these doses.No other antihypertensives are on board. Labs per nephrology would be helpful for anemia, as they are already doing. He is not on anticoagulation.  

## 2012-06-01 NOTE — Progress Notes (Signed)
HPI: Albert Lewis is a 61 y/o patient of Dr. Dietrich Pates we are seeing for ongoing assessment and treatment of hypertension, PAF, hypercholesterolemia. He has ESRD and has dialysis MWF. He comes today stating that his BP has been too low during dialysis. He was on norvasc but this was d/c'd by his nephrologist. He states that the nurses are just filtering his blood and not taking fluid off due to hypotension. He denies fluid retention prior to dialysis or hypertension for that matter. He is not dizzy or near syncopal when not having dialysis.  Allergies  Allergen Reactions  . Penicillins Other (See Comments)    Couldn't walk.     Current Outpatient Prescriptions  Medication Sig Dispense Refill  . allopurinol (ZYLOPRIM) 100 MG tablet Take 100 mg by mouth daily.       Marland Kitchen aspirin EC 81 MG tablet Take 81 mg by mouth daily.      . B Complex-C-Folic Acid (NEPHRO-VITE PO) Take 1 mg by mouth daily.       . cinacalcet (SENSIPAR) 30 MG tablet Take 30 mg by mouth daily.       . fish oil-omega-3 fatty acids 1000 MG capsule Take 1 g by mouth 3 (three) times daily.      . fluticasone (FLONASE) 50 MCG/ACT nasal spray Place 1 spray into the nose daily as needed. One puff per nostril daily for allergies       . gabapentin (NEURONTIN) 300 MG capsule Take 300 mg by mouth 2 (two) times daily.       Marland Kitchen gemfibrozil (LOPID) 600 MG tablet Take 600 mg by mouth 2 (two) times daily.      Marland Kitchen glipiZIDE (GLUCOTROL) 5 MG tablet Take 10 mg by mouth 2 (two) times daily.       Marland Kitchen HYDROcodone-acetaminophen (VICODIN ES) 7.5-750 MG per tablet Take 1 tablet by mouth every 8 (eight) hours as needed for pain.  40 tablet  1  . loratadine (CLARITIN) 10 MG tablet Take 10 mg by mouth daily as needed. Allergies      . metoprolol tartrate (LOPRESSOR) 25 MG tablet Take 25 mg by mouth 2 (two) times daily.      . pantoprazole (PROTONIX) 40 MG tablet Take 1 tablet (40 mg total) by mouth daily.  90 tablet  3  . pentoxifylline (TRENTAL) 400 MG CR  tablet Take 400 mg by mouth 3 (three) times daily.      Marland Kitchen senna (SENNA CONCENTRATE) 8.6 MG tablet Take 1 tablet by mouth daily.       . sevelamer (RENVELA) 800 MG tablet Take 4,000 mg by mouth 3 (three) times daily with meals. Take five tablets three times a day before meals          Past Medical History  Diagnosis Date  . Paroxysmal atrial fibrillation 2/09    Abnormal stress nuclear study in 01/2008; normal coronary angiography  . Hyperlipidemia 2008  . Hypertension 1993    Mild to moderate LVH  . Diabetes mellitus, type 2 1993    no insulin  . ESRD (end stage renal disease) on dialysis     Dialysis since 2007  . Obesity     sleep apnea treated with BiPAP  . Anemia   . Gastroesophageal reflux disease   . Hyperparathyroidism     Secondary  . Allergic rhinitis   . Lower gastrointestinal bleed 04/2008    diverticulosis; hemorrhoids; constipation  . Diverticular disease   . Chronic back pain  Degenerative disc disease    Past Surgical History  Procedure Date  . Cholecystectomy 1999  . Exploratory laparotomy     shunt complications  . Cataract extraction w/ intraocular lens  implant, bilateral 2008  . Lumbar peritoneal shunt     [id number] of systems complete and found to be negative unless listed above  PHYSICAL EXAM BP 122/62  Pulse 70  Resp 16  Ht 6\' 2"  (1.88 m)  Wt 264 lb (119.75 kg)  BMI 33.90 kg/m2  General: Well developed, well nourished, in no acute distress, obese. Head: Eyes PERRLA, No xanthomas.   Normal cephalic and atramatic  Lungs: Clear bilaterally to auscultation and percussion. Heart: HRRR S1 S2, without MRG.  Pulses are 2+ & equal.            No carotid bruit. No JVD.  No abdominal bruits. No femoral bruits. Abdomen: Bowel sounds are positive, abdomen soft and non-tender without masses or                  Hernia's noted. Msk:  Back normal, normal gait. Normal strength and tone for age. Extremities: No clubbing, cyanosis or edema.  DP +1  Dialysis shunt to the left arm. Good thrill palpated. Neuro: Alert and oriented X 3. Psych:  Good affect, responds appropriately    ASSESSMENT AND PLAN

## 2012-06-01 NOTE — Patient Instructions (Addendum)
**Note De-identified Melissaann Dizdarevic Obfuscation** Your physician recommends that you continue on your current medications as directed. Please refer to the Current Medication list given to you today.  Your physician recommends that you schedule a follow-up appointment in: 3 months  

## 2012-06-01 NOTE — Assessment & Plan Note (Signed)
It is important that his HR be controlled with PAF. Lopressor dose is necessary for this. He is off of Norvasc at present with normal BP. It is possible that his dry wt needs to be adjusted and less fluid needs to be removed during dialysis, with more filtering only. Will not adjust medications at this time as he is doing well on these doses.No other antihypertensives are on board. Labs per nephrology would be helpful for anemia, as they are already doing. He is not on anticoagulation.

## 2012-06-15 ENCOUNTER — Ambulatory Visit: Payer: Medicare Other | Admitting: Family Medicine

## 2012-06-16 ENCOUNTER — Encounter: Payer: Self-pay | Admitting: Family Medicine

## 2012-06-21 ENCOUNTER — Telehealth: Payer: Self-pay | Admitting: Family Medicine

## 2012-06-21 NOTE — Telephone Encounter (Signed)
Is it ok to refill this?  

## 2012-06-21 NOTE — Telephone Encounter (Signed)
Send 2 weeks only he missed his 4 month follow up, needs ov

## 2012-06-23 NOTE — Telephone Encounter (Signed)
Med called in.  Unable to reach pt by phone.

## 2012-06-29 ENCOUNTER — Telehealth: Payer: Self-pay | Admitting: Family Medicine

## 2012-06-29 NOTE — Telephone Encounter (Signed)
Missed his recent appt with me , needs to come in , get updated labs etc, and I will be happy to refer, he is on dialysis so needs appt on day he is not at dialysis

## 2012-07-06 ENCOUNTER — Ambulatory Visit (INDEPENDENT_AMBULATORY_CARE_PROVIDER_SITE_OTHER): Payer: Medicare Other | Admitting: Family Medicine

## 2012-07-06 ENCOUNTER — Encounter: Payer: Self-pay | Admitting: Family Medicine

## 2012-07-06 VITALS — BP 118/60 | HR 83 | Resp 16 | Wt 266.1 lb

## 2012-07-06 DIAGNOSIS — R5381 Other malaise: Secondary | ICD-10-CM

## 2012-07-06 DIAGNOSIS — E785 Hyperlipidemia, unspecified: Secondary | ICD-10-CM

## 2012-07-06 DIAGNOSIS — R5383 Other fatigue: Secondary | ICD-10-CM

## 2012-07-06 DIAGNOSIS — M549 Dorsalgia, unspecified: Secondary | ICD-10-CM

## 2012-07-06 DIAGNOSIS — I10 Essential (primary) hypertension: Secondary | ICD-10-CM

## 2012-07-06 DIAGNOSIS — E119 Type 2 diabetes mellitus without complications: Secondary | ICD-10-CM

## 2012-07-06 LAB — CBC WITH DIFFERENTIAL/PLATELET
Basophils Absolute: 0 10*3/uL (ref 0.0–0.1)
Basophils Relative: 0 % (ref 0–1)
HCT: 39.5 % (ref 39.0–52.0)
Lymphocytes Relative: 23 % (ref 12–46)
MCHC: 34.4 g/dL (ref 30.0–36.0)
Neutro Abs: 4.2 10*3/uL (ref 1.7–7.7)
Neutrophils Relative %: 65 % (ref 43–77)
Platelets: 188 10*3/uL (ref 150–400)
RDW: 13 % (ref 11.5–15.5)
WBC: 6.5 10*3/uL (ref 4.0–10.5)

## 2012-07-06 LAB — COMPREHENSIVE METABOLIC PANEL
AST: 16 U/L (ref 0–37)
BUN: 27 mg/dL — ABNORMAL HIGH (ref 6–23)
Calcium: 9.8 mg/dL (ref 8.4–10.5)
Chloride: 94 mEq/L — ABNORMAL LOW (ref 96–112)
Creat: 9.37 mg/dL — ABNORMAL HIGH (ref 0.50–1.35)
Glucose, Bld: 129 mg/dL — ABNORMAL HIGH (ref 70–99)

## 2012-07-06 LAB — LIPID PANEL
Cholesterol: 191 mg/dL (ref 0–200)
Triglycerides: 295 mg/dL — ABNORMAL HIGH (ref <150)
VLDL: 59 mg/dL — ABNORMAL HIGH (ref 0–40)

## 2012-07-06 LAB — HEMOGLOBIN A1C
Hgb A1c MFr Bld: 7.1 % — ABNORMAL HIGH (ref <5.7)
Mean Plasma Glucose: 157 mg/dL — ABNORMAL HIGH (ref <117)

## 2012-07-06 LAB — TSH: TSH: 1.321 u[IU]/mL (ref 0.350–4.500)

## 2012-07-06 NOTE — Progress Notes (Signed)
  Subjective:    Patient ID: Albert Lewis, male    DOB: 08/12/1951, 61 y.o.   MRN: [id number]  HPI The PT is here for follow up and re-evaluation of chronic medical conditions, medication management and review of any available recent lab and radiology data.  Preventive health is updated, specifically  Cancer screening and Immunization.   Questions or concerns regarding consultations or procedures which the PT has had in the interim are  addressed. The PT denies any adverse reactions to current medications since the last visit.  C/o increased and uncontrolled back pain, want rept epidurals which he had earlier this year and which were helpful. Requests evaluation for diabetic shoes, also  referral to local endo C/o fluctuations in blood sugar with hypoglycemic episodes. Is followed by podiatry and has had eye exam in past 12 month      Review of Systems See HPI Denies recent fever or chills. Denies sinus pressure, nasal congestion, ear pain or sore throat. Denies chest congestion, productive cough or wheezing. Denies chest pains, palpitations and leg swelling Denies abdominal pain, nausea, vomiting,diarrhea or constipation.   Denies dysuria, frequency, hesitancy or incontinence.  Denies headaches, seizures, numbness, or tingling. Denies depression, anxiety or insomnia. Denies skin break down or rash.        Objective:   Physical Exam Patient alert and oriented and in no cardiopulmonary distress.  HEENT: No facial asymmetry, EOMI, no sinus tenderness,  oropharynx pink and moist.  Neck supple no adenopathy.  Chest: Clear to auscultation bilaterally.  CVS: S1, S2 no murmurs, no S3.  ABD: Soft non tender. Bowel sounds normal.  Ext: No edema  MS: decrased  ROM adequate in , shoulders, hips and knees.  Skin: Intact, no ulcerations or rash noted.  Psych: Good eye contact, normal affect. Memory intact not anxious or depressed appearing.  CNS: CN 2-12 intact, power,  normal throughout.  Diabetic Foot Check:  Appearance -  calluses Skin -severe tinea pedis and onychomycosis Sensation - grossly intact t diminished  light touch Monofilament testing -  Right - Great toe, medial, central, lateral ball and posterior foot absent Left - Great toe, medial, central, lateral ball and posterior foot absent Pulses Left - Dorsalis Pedis and Posterior Tibia diminished Right - Dorsalis Pedis and Posterior Tibia diminished       Assessment & Plan:

## 2012-07-06 NOTE — Patient Instructions (Addendum)
Diabetic Foot Check:  Appearance -  calluses Skin - severe tinea pedis and onychomycosis  Sensation - grossly diminished to  light touch Monofilament testing -  Right - Great toe, medial, central, lateral ball and posterior foot absent Left - Great toe, medial, central, lateral ball and posterior foot absent Pulses Left - Dorsalis Pedis and Posterior Tibia diminished Right - Dorsalis Pedis and Posterior Tibia diminished Annual wellness in 3.5 month  You can call Dr Eduard Clos for management of uncontrolled back pain, let us known if you need a referral'   [facility] are referred to [facility]  Fasting lipid, cmp, hBA1C, tsh, cbc today

## 2012-07-08 NOTE — Assessment & Plan Note (Signed)
Deteriorated, pt to call pain specialist for rept injections

## 2012-07-08 NOTE — Assessment & Plan Note (Signed)
Adequate control, has nephropathy and neuropathy, will refer to local endo, per hsi request

## 2012-07-08 NOTE — Assessment & Plan Note (Signed)
Controlled, no change in medication  

## 2012-07-08 NOTE — Assessment & Plan Note (Signed)
Uncontrolled triglycerides elevated, low fat diet esp reduction in butter and cheese discussed and encouraged, no med change

## 2012-07-12 ENCOUNTER — Other Ambulatory Visit: Payer: Self-pay | Admitting: Physical Medicine and Rehabilitation

## 2012-07-12 DIAGNOSIS — M546 Pain in thoracic spine: Secondary | ICD-10-CM

## 2012-07-12 DIAGNOSIS — G894 Chronic pain syndrome: Secondary | ICD-10-CM

## 2012-07-12 DIAGNOSIS — M5137 Other intervertebral disc degeneration, lumbosacral region: Secondary | ICD-10-CM

## 2012-07-12 DIAGNOSIS — M545 Low back pain: Secondary | ICD-10-CM

## 2012-07-12 DIAGNOSIS — M47817 Spondylosis without myelopathy or radiculopathy, lumbosacral region: Secondary | ICD-10-CM

## 2012-07-20 ENCOUNTER — Ambulatory Visit
Admission: RE | Admit: 2012-07-20 | Discharge: 2012-07-20 | Disposition: A | Discharge status: Home / Self Care | Payer: Medicare Other | Source: Ambulatory Visit | Attending: Physical Medicine and Rehabilitation | Admitting: Physical Medicine and Rehabilitation

## 2012-07-20 DIAGNOSIS — M546 Pain in thoracic spine: Secondary | ICD-10-CM

## 2012-07-20 DIAGNOSIS — G894 Chronic pain syndrome: Secondary | ICD-10-CM

## 2012-07-20 DIAGNOSIS — M47817 Spondylosis without myelopathy or radiculopathy, lumbosacral region: Secondary | ICD-10-CM

## 2012-07-20 DIAGNOSIS — M5137 Other intervertebral disc degeneration, lumbosacral region: Secondary | ICD-10-CM

## 2012-07-20 DIAGNOSIS — M545 Low back pain: Secondary | ICD-10-CM

## 2012-08-11 ENCOUNTER — Encounter: Payer: Self-pay | Admitting: Family Medicine

## 2012-08-11 ENCOUNTER — Ambulatory Visit (INDEPENDENT_AMBULATORY_CARE_PROVIDER_SITE_OTHER): Payer: Medicare Other | Admitting: Family Medicine

## 2012-08-11 VITALS — BP 118/58 | HR 92 | Resp 16 | Ht 74.0 in | Wt 260.0 lb

## 2012-08-11 DIAGNOSIS — M25569 Pain in unspecified knee: Secondary | ICD-10-CM

## 2012-08-11 MED ORDER — HYDROCODONE-ACETAMINOPHEN 5-325 MG PO TABS: 1.0000 | ORAL_TABLET | ORAL | Status: AC | PRN

## 2012-08-11 NOTE — Patient Instructions (Signed)
Pain medicine refilled for knee and back Follow-up with back doctor for injections Call if you knee does not improve or starts to swell Keep previous appt with Dr. Lodema Hong

## 2012-08-11 NOTE — Progress Notes (Signed)
  Subjective:    Patient ID: Albert Lewis, male    DOB: Jun 04, 1951, 61 y.o.   MRN: [id number]  HPI Patient presents with left knee pain for the past week. He denies any particular injury . He is currently being treated for low back pain with radicular symptoms. They're planning to do epidural injections if he agrees. He denies swelling of the knee. The knee has been giving out at times and he fell 2 days in row per report. He is on dialysis 3 times a week and is unable to take anti-inflammatories   Review of Systems - per above      Objective:   Physical Exam GEN-NAD, alert and oriented x 3  Knee- Right- Normal inspection, good ROM, +crepitus            Left- normal inspection, no effusion, TTP lateral and medical aspects of patella and inferior patella, mild crepitus, good ROM, pain with extension   Bilateral ankles, normal ROM        Assessment & Plan:

## 2012-08-13 DIAGNOSIS — M25569 Pain in unspecified knee: Secondary | ICD-10-CM | POA: Insufficient documentation

## 2012-08-13 NOTE — Assessment & Plan Note (Signed)
Acute knee pain, no specific injury, no swelling or effusion, unable to take NSAIDS due to renal status, possible related to his back with antalgic gait, at this time will not obtain imaging as very acute, refilled hydrocodone, given instructions below.  If he does not improve in a few weeks xray and refer to ortho

## 2012-08-31 ENCOUNTER — Ambulatory Visit (INDEPENDENT_AMBULATORY_CARE_PROVIDER_SITE_OTHER): Payer: Medicare Other | Admitting: Adult Health

## 2012-08-31 ENCOUNTER — Encounter: Payer: Self-pay | Admitting: Adult Health

## 2012-08-31 VITALS — BP 136/58 | HR 92 | Ht 74.0 in | Wt 264.1 lb

## 2012-08-31 DIAGNOSIS — I4891 Unspecified atrial fibrillation: Secondary | ICD-10-CM

## 2012-08-31 DIAGNOSIS — I10 Essential (primary) hypertension: Secondary | ICD-10-CM

## 2012-08-31 NOTE — Progress Notes (Signed)
HPI: [name] is a pleasant 61 year old obese patient of Dr. Dietrich Pates we are seeing for ongoing followup and treatment of paroxysmal atrial fibrillation, hypertension, hypercholesterolemia, with history of end-stage renal disease with dialysis on Monday Wednesday Friday. The patient states he is on the transplant list via the [place]. Report has been started and he is awaiting further plans. The patient has been without cardiac complaints. He has a history of some degenerative disc disease and some left knee pain which he is being treated medically for by his primary care physician Dr. Jeanice Lim. Otherwise he has been doing well medically compliant, and avoiding salt.  Allergies  Allergen Reactions  . Celebrex (Celecoxib)   . Penicillins Other (See Comments)    Couldn't walk.     Current Outpatient Prescriptions  Medication Sig Dispense Refill  . allopurinol (ZYLOPRIM) 100 MG tablet Take 100 mg by mouth daily.       Marland Kitchen aspirin EC 81 MG tablet Take 81 mg by mouth daily.      . B Complex-C-Folic Acid (NEPHRO-VITE PO) Take 1 mg by mouth daily.       . cinacalcet (SENSIPAR) 30 MG tablet Take 30 mg by mouth daily.       . fish oil-omega-3 fatty acids 1000 MG capsule Take 1 g by mouth 3 (three) times daily.      . fluticasone (FLONASE) 50 MCG/ACT nasal spray Place 1 spray into the nose daily as needed. One puff per nostril daily for allergies       . gabapentin (NEURONTIN) 300 MG capsule Take 300 mg by mouth 2 (two) times daily.       Marland Kitchen gemfibrozil (LOPID) 600 MG tablet Take 600 mg by mouth 2 (two) times daily.      Marland Kitchen HYDROcodone-acetaminophen (VICODIN ES) 7.5-750 MG per tablet Take 1 tablet by mouth every 8 (eight) hours as needed for pain.  40 tablet  1  . insulin glargine (LANTUS) 100 UNIT/ML injection Inject 20 Units into the skin at bedtime.      Marland Kitchen linagliptin (TRADJENTA) 5 MG TABS tablet Take 5 mg by mouth daily.      Marland Kitchen loratadine (CLARITIN) 10 MG tablet Take 10 mg by mouth daily as needed.  Allergies      . losartan (COZAAR) 100 MG tablet Take 100 mg by mouth daily.      . metoprolol tartrate (LOPRESSOR) 25 MG tablet Take 25 mg by mouth 2 (two) times daily.      . pantoprazole (PROTONIX) 40 MG tablet Take 1 tablet (40 mg total) by mouth daily.  90 tablet  3  . pentoxifylline (TRENTAL) 400 MG CR tablet Take 400 mg by mouth 3 (three) times daily.      Marland Kitchen senna (SENNA CONCENTRATE) 8.6 MG tablet Take 1 tablet by mouth daily.       . sevelamer (RENVELA) 800 MG tablet Take 4,000 mg by mouth 3 (three) times daily with meals. Take five tablets three times a day before meals          Past Medical History  Diagnosis Date  . Paroxysmal atrial fibrillation 2/09    Abnormal stress nuclear study in 01/2008; normal coronary angiography  . Hyperlipidemia 2008  . Hypertension 1993    Mild to moderate LVH  . Diabetes mellitus, type 2 1993    no insulin  . ESRD (end stage renal disease) on dialysis     Dialysis since 2007  . Obesity     sleep apnea  treated with BiPAP  . Anemia   . Gastroesophageal reflux disease   . Hyperparathyroidism     Secondary  . Allergic rhinitis   . Lower gastrointestinal bleed 04/2008    diverticulosis; hemorrhoids; constipation  . Diverticular disease   . Chronic back pain     Degenerative disc disease    Past Surgical History  Procedure Date  . Cholecystectomy 1999  . Exploratory laparotomy     shunt complications  . Cataract extraction w/ intraocular lens  implant, bilateral 2008  . Lumbar peritoneal shunt     [id number] of systems complete and found to be negative unless listed above  PHYSICAL EXAM BP 136/58  Pulse 92  Ht 6\' 2"  (1.88 m)  Wt 264 lb 1.9 oz (119.804 kg)  BMI 33.91 kg/m2  General: Well developed, well nourished, in no acute distress, obese. Head: Eyes PERRLA, No xanthomas.   Normal cephalic and atramatic  Lungs: Clear bilaterally to auscultation and percussion. Heart: HRRR S1 S2, without MRG.  Pulses are 2+ & equal.             No carotid bruit. No JVD.  No abdominal bruits. No femoral bruits. Abdomen: Bowel sounds are positive, abdomen soft and non-tender without masses or  Hernia's noted. Msk:  Back normal, normal gait. Normal strength and tone for age. Extremities: No clubbing, cyanosis or edema. Dialysis shunt in the left arm. Good thrill.  DP +1 Neuro: Alert and oriented X 3. Psych:  Good affect, responds appropriately    ASSESSMENT AND PLAN

## 2012-08-31 NOTE — Assessment & Plan Note (Signed)
Heart rate is regular, well controlled, without complaints of palpitations or heart racing per patient. She remains on Coumadin with dosage her INR level. No medication changes at this time. He will continue rate control with Lopressor 25 mg twice a day.

## 2012-08-31 NOTE — Patient Instructions (Signed)
Your physician wants you to follow-up in: 6 months with Albert Lewis. You will receive a reminder letter in the mail two months in advance. If you don't receive a letter, please call our office to schedule the follow-up appointment.  Your physician recommends that you continue on your current medications as directed. Please refer to the Current Medication list given to you today.

## 2012-08-31 NOTE — Assessment & Plan Note (Signed)
Blood pressure is well-controlled, no changes in his medicine. Dialysis is not causing him to feel hypotensive and dizzy as he did in the past. He states they are changing how they dialyzed him and pulling less fluid off of him, and doing more filtration instead.

## 2012-09-26 ENCOUNTER — Ambulatory Visit (INDEPENDENT_AMBULATORY_CARE_PROVIDER_SITE_OTHER): Payer: Medicare Other | Admitting: Family Medicine

## 2012-09-26 ENCOUNTER — Ambulatory Visit (HOSPITAL_COMMUNITY)
Admission: RE | Admit: 2012-09-26 | Discharge: 2012-09-26 | Disposition: A | Discharge status: Home / Self Care | Payer: Medicare Other | Source: Ambulatory Visit | Attending: Family Medicine | Admitting: Family Medicine

## 2012-09-26 ENCOUNTER — Encounter: Payer: Self-pay | Admitting: Family Medicine

## 2012-09-26 VITALS — BP 152/62 | HR 94 | Temp 98.5°F | Resp 18 | Ht 74.0 in | Wt 269.0 lb

## 2012-09-26 DIAGNOSIS — J069 Acute upper respiratory infection, unspecified: Secondary | ICD-10-CM | POA: Insufficient documentation

## 2012-09-26 DIAGNOSIS — J189 Pneumonia, unspecified organism: Secondary | ICD-10-CM

## 2012-09-26 DIAGNOSIS — I10 Essential (primary) hypertension: Secondary | ICD-10-CM | POA: Insufficient documentation

## 2012-09-26 DIAGNOSIS — I4891 Unspecified atrial fibrillation: Secondary | ICD-10-CM | POA: Insufficient documentation

## 2012-09-26 MED ORDER — AZITHROMYCIN 250 MG PO TABS: ORAL_TABLET | ORAL | Status: AC

## 2012-09-26 NOTE — Progress Notes (Signed)
  Subjective:    Patient ID: Albert Lewis, male    DOB: January 15, 1951, 61 y.o.   MRN: [id number]  HPI  Patient present with cough with minimal production and congestion fever and body aches for the past 3-4 days. He is on dialysis 3 times a week. He had emesis yesterday nonbloody nonbilious. He has not taken his fever he states he's had fever most days this week. His chest hurts more on the right lower side when he coughs he states he feels similar to when he had pneumonia  Review of Systems  GEN- + fatigue, fever, weight loss,weakness, recent illness HEENT- denies eye drainage, change in vision, nasal discharge, CVS- denies chest pain, palpitations RESP- denies SOB, +cough, wheeze ABD- + N/V, change in stools, abd pain Neuro- denies headache, dizziness, syncope, seizure activity      Objective:   Physical Exam GEN- NAD, alert and oriented x3. Fatigued appearing  HEENT- PERRL, EOMI, non injected sclera, pink conjunctiva, MMM, oropharynx clear Neck- Supple, no LAD CVS- RRR, no murmur RESP-course Rhonchi- RLL, normal WOB, oxygen sat 90%  EXT- No edema Pulses- Radial 2+        Assessment & Plan:

## 2012-09-26 NOTE — Patient Instructions (Signed)
Continue mucinex  Get the xray of your chest Rocephin injection given  F/U as previous with Dr. Lodema Hong

## 2012-09-26 NOTE — Assessment & Plan Note (Signed)
Chest x-ray done which was normal as there was concern for possible community-acquired pneumonia based on exam. His oxygen sats are also lower than his typical. He was given Rocephin 500 mg in office today. He will complete course with Z-Pak. Continue Mucinex as needed

## 2012-09-27 DIAGNOSIS — J069 Acute upper respiratory infection, unspecified: Secondary | ICD-10-CM

## 2012-09-27 MED ORDER — CEFTRIAXONE SODIUM 500 MG IJ SOLR
500.0000 mg | Freq: Once | INTRAMUSCULAR | Status: AC
  Administered 2012-09-27: 500 mg via INTRAMUSCULAR

## 2012-09-27 NOTE — Addendum Note (Signed)
Addended by: Abner Greenspan on: 09/27/2012 12:57 PM   Modules accepted: Orders

## 2012-10-04 ENCOUNTER — Ambulatory Visit (INDEPENDENT_AMBULATORY_CARE_PROVIDER_SITE_OTHER): Payer: Medicare Other | Admitting: Family Medicine

## 2012-10-04 ENCOUNTER — Encounter: Payer: Self-pay | Admitting: Family Medicine

## 2012-10-04 VITALS — BP 148/76 | HR 72 | Temp 98.8°F | Resp 18 | Ht 74.0 in | Wt 261.1 lb

## 2012-10-04 DIAGNOSIS — E785 Hyperlipidemia, unspecified: Secondary | ICD-10-CM

## 2012-10-04 DIAGNOSIS — N186 End stage renal disease: Secondary | ICD-10-CM

## 2012-10-04 DIAGNOSIS — J209 Acute bronchitis, unspecified: Secondary | ICD-10-CM

## 2012-10-04 DIAGNOSIS — E1122 Type 2 diabetes mellitus with diabetic chronic kidney disease: Secondary | ICD-10-CM

## 2012-10-04 DIAGNOSIS — E1129 Type 2 diabetes mellitus with other diabetic kidney complication: Secondary | ICD-10-CM

## 2012-10-04 DIAGNOSIS — I10 Essential (primary) hypertension: Secondary | ICD-10-CM

## 2012-10-04 DIAGNOSIS — M549 Dorsalgia, unspecified: Secondary | ICD-10-CM

## 2012-10-04 DIAGNOSIS — J320 Chronic maxillary sinusitis: Secondary | ICD-10-CM

## 2012-10-04 MED ORDER — CLINDAMYCIN HCL 300 MG PO CAPS: 300.0000 mg | ORAL_CAPSULE | Freq: Three times a day (TID) | ORAL | Status: DC

## 2012-10-04 MED ORDER — BENZONATATE 100 MG PO CAPS: 100.0000 mg | ORAL_CAPSULE | Freq: Four times a day (QID) | ORAL | Status: DC | PRN

## 2012-10-04 NOTE — Patient Instructions (Addendum)
F/U in end January, please call i and conme in sooner if you do not get better  Antibiotic and decongestant are sent in for treatment of sinusitis and bronchitis.  All the best with your trip for a donor kidney

## 2012-10-04 NOTE — Progress Notes (Signed)
  Subjective:    Patient ID: Albert Lewis, male    DOB: October 18, 1951, 61 y.o.   MRN: [id number]  HPI 3 week h/o increased had congestion green drainage from sinuses, also cough productive of yellow green sputum, has had intermittent chills no fever Patient was recently treated for similar symptoms with not much relief   Review of Systems See HPI Denies chest pains, palpitations and leg swelling Denies abdominal pain, nausea, vomiting,diarrhea or constipation.   Denies dysuria, frequency, hesitancy or incontinence. Denies joint pain, swelling and limitation in mobility. Denies headaches, seizures, numbness, or tingling. Denies depression, anxiety or insomnia. Denies skin break down or rash.        Objective:   Physical Exam  Patient alert and oriented and in no cardiopulmonary distress.  HEENT: No facial asymmetry, frontal and maxillary  no sinus tenderness,  oropharynx pink and moist.  Neck supple no adenopathy.  Chest: decreased air entry scattered crackles no wheezes CVS: S1, S2 no murmurs, no S3.  ABD: Soft non tender. Bowel sounds normal.  Ext: No edema  MS: Adequate ROM spine, shoulders, hips and knees.  Skin: Intact, no ulcerations or rash noted.  Psych: Good eye contact, normal affect. Memory intact not anxious or depressed appearing.  CNS: CN 2-12 intact, power, tone and sensation normal throughout.       Assessment & Plan:

## 2012-10-06 NOTE — Assessment & Plan Note (Signed)
2 week antibiotic course prescribed 

## 2012-10-06 NOTE — Assessment & Plan Note (Signed)
Controlled, no change in medication  

## 2012-10-06 NOTE — Assessment & Plan Note (Signed)
Controlled and followed by endo 

## 2012-10-06 NOTE — Assessment & Plan Note (Signed)
Uncontrolled Low fat diet discussed and encouraged, no med change

## 2012-10-06 NOTE — Assessment & Plan Note (Signed)
Improved pain control through pain clinic

## 2012-10-06 NOTE — Assessment & Plan Note (Signed)
Acute bronchitis, antibiotic prescribed, appears to have sinus infection as symptoms are prolonged and did not respond clinically to standard treatment

## 2012-10-19 ENCOUNTER — Ambulatory Visit (INDEPENDENT_AMBULATORY_CARE_PROVIDER_SITE_OTHER): Payer: Medicare Other | Admitting: Family Medicine

## 2012-10-19 ENCOUNTER — Ambulatory Visit (HOSPITAL_COMMUNITY)
Admission: RE | Admit: 2012-10-19 | Discharge: 2012-10-19 | Disposition: A | Discharge status: Home / Self Care | Payer: Medicare Other | Source: Ambulatory Visit | Attending: Family Medicine | Admitting: Family Medicine

## 2012-10-19 ENCOUNTER — Encounter: Payer: Self-pay | Admitting: Family Medicine

## 2012-10-19 VITALS — BP 144/70 | HR 73 | Temp 98.8°F | Resp 15 | Wt 265.4 lb

## 2012-10-19 DIAGNOSIS — J209 Acute bronchitis, unspecified: Secondary | ICD-10-CM

## 2012-10-19 DIAGNOSIS — J40 Bronchitis, not specified as acute or chronic: Secondary | ICD-10-CM | POA: Insufficient documentation

## 2012-10-19 DIAGNOSIS — R062 Wheezing: Secondary | ICD-10-CM | POA: Insufficient documentation

## 2012-10-19 LAB — CBC WITH DIFFERENTIAL/PLATELET
Hemoglobin: 11.4 g/dL — ABNORMAL LOW (ref 13.0–17.0)
Lymphs Abs: 1.5 10*3/uL (ref 0.7–4.0)
Monocytes Relative: 13 % — ABNORMAL HIGH (ref 3–12)
Neutro Abs: 3.1 10*3/uL (ref 1.7–7.7)
Neutrophils Relative %: 59 % (ref 43–77)
Platelets: 177 10*3/uL (ref 150–400)
RBC: 3.61 MIL/uL — ABNORMAL LOW (ref 4.22–5.81)
WBC: 5.4 10*3/uL (ref 4.0–10.5)

## 2012-10-19 LAB — BASIC METABOLIC PANEL
CO2: 29 mEq/L (ref 19–32)
Glucose, Bld: 86 mg/dL (ref 70–99)
Potassium: 4.3 mEq/L (ref 3.5–5.3)
Sodium: 137 mEq/L (ref 135–145)

## 2012-10-19 MED ORDER — GUAIFENESIN-CODEINE 100-10 MG/5ML PO SYRP: 5.0000 mL | ORAL_SOLUTION | Freq: Three times a day (TID) | ORAL | Status: DC | PRN

## 2012-10-19 MED ORDER — ALBUTEROL SULFATE HFA 108 (90 BASE) MCG/ACT IN AERS: 2.0000 | INHALATION_SPRAY | Freq: Four times a day (QID) | RESPIRATORY_TRACT | Status: AC | PRN

## 2012-10-19 MED ORDER — PREDNISONE 20 MG PO TABS: 20.0000 mg | ORAL_TABLET | Freq: Every day | ORAL | Status: DC

## 2012-10-19 NOTE — Progress Notes (Signed)
  Subjective:    Patient ID: Albert Lewis, male    DOB: Oct 07, 1951, 61 y.o.   MRN: [id number]  HPI  Pt presents with continued cough with production, fatigue and chills. Treated for URI/bronchitis 4 weeks ago, negative CXR , given Rocephin IM X 1 dose and zpak, 2 weeks later he returned with unchanged symptoms, treated for bronchitis and sinusitis with clindamycin and tessalon perrles. States he feels no better, very sore in chest, occasional wheezes and breathes hard. Sinuses have cleared up   Review of Systems - per above    GEN- + fatigue, fever, weight loss,weakness, recent illness HEENT- denies eye drainage, change in vision, nasal discharge, CVS- denies chest pain, palpitations RESP- denies SOB,+ cough, wheeze ABD- denies N/V, change in stools, abd pain MSK- denies joint pain,+ muscle aches, injury Neuro- denies headache, dizziness, syncope, seizure activity       Objective:   Physical Exam GEN- NAD, alert and oriented x3. Fatigued appearing  HEENT- PERRL, EOMI, non injected sclera, pink conjunctiva, MMM, oropharynx clear Neck- Supple, shotty LAD CVS- RRR, no murmur RESP-course Rhonchi- bilateral, + wheeze, normal WOB EXT- No edema Pulses- Radial 2+        Assessment & Plan:

## 2012-10-19 NOTE — Assessment & Plan Note (Addendum)
Worsened symptoms. Obtain repeat CXR with change in exam. CBC, BMET. Based on findings will need IV antibiotics given with HD. Start prednisone and albuterol inhaler, cough med  CXR- no infiltrate atelectasis and bronchitis changes

## 2012-10-19 NOTE — Patient Instructions (Addendum)
Get the blood drawn and chest xray  Start prednisone and use the inhaler for coughing spells New cough syrup sent over  F/U as previous with Dr. Lodema Hong

## 2012-10-26 ENCOUNTER — Encounter: Payer: Medicare Other | Admitting: Family Medicine

## 2012-10-26 ENCOUNTER — Encounter: Payer: Self-pay | Admitting: Family Medicine

## 2012-12-19 ENCOUNTER — Other Ambulatory Visit: Payer: Self-pay | Admitting: Family Medicine

## 2013-02-14 ENCOUNTER — Emergency Department (HOSPITAL_COMMUNITY): Payer: Medicare Other

## 2013-02-14 ENCOUNTER — Encounter (HOSPITAL_COMMUNITY): Payer: Self-pay | Admitting: Emergency Medicine

## 2013-02-14 ENCOUNTER — Emergency Department (HOSPITAL_COMMUNITY)
Admission: EM | Admit: 2013-02-14 | Discharge: 2013-02-14 | Disposition: A | Discharge status: Home / Self Care | Payer: Medicare Other | Attending: Emergency Medicine | Admitting: Emergency Medicine

## 2013-02-14 DIAGNOSIS — Z992 Dependence on renal dialysis: Secondary | ICD-10-CM | POA: Insufficient documentation

## 2013-02-14 DIAGNOSIS — E785 Hyperlipidemia, unspecified: Secondary | ICD-10-CM | POA: Insufficient documentation

## 2013-02-14 DIAGNOSIS — Z862 Personal history of diseases of the blood and blood-forming organs and certain disorders involving the immune mechanism: Secondary | ICD-10-CM | POA: Insufficient documentation

## 2013-02-14 DIAGNOSIS — R61 Generalized hyperhidrosis: Secondary | ICD-10-CM | POA: Insufficient documentation

## 2013-02-14 DIAGNOSIS — N186 End stage renal disease: Secondary | ICD-10-CM | POA: Insufficient documentation

## 2013-02-14 DIAGNOSIS — M545 Low back pain, unspecified: Secondary | ICD-10-CM | POA: Insufficient documentation

## 2013-02-14 DIAGNOSIS — IMO0002 Reserved for concepts with insufficient information to code with codable children: Secondary | ICD-10-CM | POA: Insufficient documentation

## 2013-02-14 DIAGNOSIS — G8929 Other chronic pain: Secondary | ICD-10-CM | POA: Insufficient documentation

## 2013-02-14 DIAGNOSIS — Z8709 Personal history of other diseases of the respiratory system: Secondary | ICD-10-CM | POA: Insufficient documentation

## 2013-02-14 DIAGNOSIS — R079 Chest pain, unspecified: Secondary | ICD-10-CM | POA: Insufficient documentation

## 2013-02-14 DIAGNOSIS — I120 Hypertensive chronic kidney disease with stage 5 chronic kidney disease or end stage renal disease: Secondary | ICD-10-CM | POA: Insufficient documentation

## 2013-02-14 DIAGNOSIS — Z7982 Long term (current) use of aspirin: Secondary | ICD-10-CM | POA: Insufficient documentation

## 2013-02-14 DIAGNOSIS — I4891 Unspecified atrial fibrillation: Secondary | ICD-10-CM | POA: Insufficient documentation

## 2013-02-14 DIAGNOSIS — Z87891 Personal history of nicotine dependence: Secondary | ICD-10-CM | POA: Insufficient documentation

## 2013-02-14 DIAGNOSIS — M5137 Other intervertebral disc degeneration, lumbosacral region: Secondary | ICD-10-CM | POA: Insufficient documentation

## 2013-02-14 DIAGNOSIS — Z794 Long term (current) use of insulin: Secondary | ICD-10-CM | POA: Insufficient documentation

## 2013-02-14 DIAGNOSIS — E669 Obesity, unspecified: Secondary | ICD-10-CM | POA: Insufficient documentation

## 2013-02-14 DIAGNOSIS — Z79899 Other long term (current) drug therapy: Secondary | ICD-10-CM | POA: Insufficient documentation

## 2013-02-14 DIAGNOSIS — M51379 Other intervertebral disc degeneration, lumbosacral region without mention of lumbar back pain or lower extremity pain: Secondary | ICD-10-CM | POA: Insufficient documentation

## 2013-02-14 DIAGNOSIS — E119 Type 2 diabetes mellitus without complications: Secondary | ICD-10-CM | POA: Insufficient documentation

## 2013-02-14 DIAGNOSIS — K219 Gastro-esophageal reflux disease without esophagitis: Secondary | ICD-10-CM | POA: Insufficient documentation

## 2013-02-14 DIAGNOSIS — Z8719 Personal history of other diseases of the digestive system: Secondary | ICD-10-CM | POA: Insufficient documentation

## 2013-02-14 LAB — CBC WITH DIFFERENTIAL/PLATELET
Basophils Absolute: 0 10*3/uL (ref 0.0–0.1)
HCT: 38.8 % — ABNORMAL LOW (ref 39.0–52.0)
Lymphocytes Relative: 35 % (ref 12–46)
Neutro Abs: 2.5 10*3/uL (ref 1.7–7.7)
Platelets: 174 10*3/uL (ref 150–400)
RBC: 4.2 MIL/uL — ABNORMAL LOW (ref 4.22–5.81)
RDW: 13 % (ref 11.5–15.5)
WBC: 4.9 10*3/uL (ref 4.0–10.5)

## 2013-02-14 LAB — BASIC METABOLIC PANEL
Calcium: 9.1 mg/dL (ref 8.4–10.5)
Chloride: 93 mEq/L — ABNORMAL LOW (ref 96–112)
Creatinine, Ser: 6.83 mg/dL — ABNORMAL HIGH (ref 0.50–1.35)
GFR calc Af Amer: 9 mL/min — ABNORMAL LOW (ref >90)
Sodium: 133 mEq/L — ABNORMAL LOW (ref 135–145)

## 2013-02-14 LAB — TROPONIN I: Troponin I: <0.3 ng/mL (ref <0.30)

## 2013-02-14 NOTE — ED Notes (Signed)
Pt c/o centralized chest pain that began this morning after he finished dialysis. Pt describes pain as "soreness over my whole chest". Pt also reports SOB and lightheadedness. Pt states "my heart rate was over 140 when I left dialysis".

## 2013-02-14 NOTE — ED Provider Notes (Signed)
History    This chart was scribed for Donnetta Hutching, MD by Charolett Bumpers, ED Scribe. The patient was seen in room APA09/APA09. Patient's care was started at 1356.   CSN: [id number]  Arrival date & time 02/14/13  1342   First MD Initiated Contact with Patient 02/14/13 1356      Chief Complaint  Patient presents with  . Chest Pain    The history is provided by the patient. No language interpreter was used.   Albert Lewis is a 62 y.o. male who presents to the Emergency Department complaining of moderate, centralized chest pain that started this morning while receiving dialysis at 10:30 am. He states that the pain is still present, described as a dull pain. He reports associated diaphoresis. HR was 148 while at dialysis, HR is currently 80. He states he has had trouble with his heart rate in the past. He denies any SOB, leg swelling. He started dialysis in 2008 due to polycystic kidney. He received the full dialysis this morning. He also reports lower back pain that is due to a herniated disc. He has a h/o paroxysmal atrial fibrillation, Hyperlipidemia, HTN, Type II DM and end stage renal disease.  PCP: Dr. Lodema Hong  Past Medical History  Diagnosis Date  . Paroxysmal atrial fibrillation 2/09    Abnormal stress nuclear study in 01/2008; normal coronary angiography  . Hyperlipidemia 2008  . Hypertension 1993    Mild to moderate LVH  . Diabetes mellitus, type 2 1993    no insulin  . ESRD (end stage renal disease) on dialysis     Dialysis since 2007  . Obesity     sleep apnea treated with BiPAP  . Anemia   . Gastroesophageal reflux disease   . Hyperparathyroidism     Secondary  . Allergic rhinitis   . Lower gastrointestinal bleed 04/2008    diverticulosis; hemorrhoids; constipation  . Diverticular disease   . Chronic back pain     Degenerative disc disease    Past Surgical History  Procedure Laterality Date  . Cholecystectomy  1999  . Exploratory laparotomy      shunt  complications  . Cataract extraction w/ intraocular lens  implant, bilateral  2008  . Lumbar peritoneal shunt      Family History  Problem Relation Age of Onset  . Cancer Mother   . Hypertension Mother   . Hypertension Sister 71  . Hypertension Daughter 30  . Diabetes Daughter     History  Substance Use Topics  . Smoking status: Former Smoker    Types: Cigarettes  . Smokeless tobacco: Not on file  . Alcohol Use: No      Review of Systems A complete 10 system review of systems was obtained and all systems are negative except as noted in the HPI and PMH.    Allergies  Celebrex and Penicillins  Home Medications   Current Outpatient Rx  Name  Route  Sig  Dispense  Refill  . albuterol (PROVENTIL HFA;VENTOLIN HFA) 108 (90 BASE) MCG/ACT inhaler   Inhalation   Inhale 2 puffs into the lungs every 6 (six) hours as needed for wheezing.   1 Inhaler   2   . allopurinol (ZYLOPRIM) 100 MG tablet   Oral   Take 100 mg by mouth daily.          Marland Kitchen aspirin EC 81 MG tablet   Oral   Take 81 mg by mouth daily.         Marland Kitchen  B Complex-C-Folic Acid (NEPHRO-VITE PO)   Oral   Take 1 mg by mouth daily.          . benzonatate (TESSALON PERLES) 100 MG capsule   Oral   Take 1 capsule (100 mg total) by mouth every 6 (six) hours as needed for cough.   30 capsule   0   . cinacalcet (SENSIPAR) 30 MG tablet   Oral   Take 30 mg by mouth daily.          . clindamycin (CLEOCIN) 300 MG capsule   Oral   Take 1 capsule (300 mg total) by mouth 3 (three) times daily.   42 capsule   0   . fish oil-omega-3 fatty acids 1000 MG capsule   Oral   Take 1 g by mouth 3 (three) times daily.         . fluticasone (FLONASE) 50 MCG/ACT nasal spray   Nasal   Place 1 spray into the nose daily as needed. One puff per nostril daily for allergies          . gabapentin (NEURONTIN) 300 MG capsule   Oral   Take 300 mg by mouth 2 (two) times daily.          Marland Kitchen gemfibrozil (LOPID) 600 MG  tablet   Oral   Take 600 mg by mouth 2 (two) times daily.         Marland Kitchen guaiFENesin-codeine (ROBITUSSIN AC) 100-10 MG/5ML syrup   Oral   Take 5 mLs by mouth 3 (three) times daily as needed for cough.   240 mL   0   . insulin glargine (LANTUS) 100 UNIT/ML injection   Subcutaneous   Inject 20 Units into the skin at bedtime.         Marland Kitchen linagliptin (TRADJENTA) 5 MG TABS tablet   Oral   Take 5 mg by mouth daily.         Marland Kitchen loratadine (CLARITIN) 10 MG tablet   Oral   Take 10 mg by mouth daily as needed. Allergies         . losartan (COZAAR) 100 MG tablet   Oral   Take 100 mg by mouth daily.         . metoprolol tartrate (LOPRESSOR) 25 MG tablet   Oral   Take 25 mg by mouth 2 (two) times daily.         . pantoprazole (PROTONIX) 40 MG tablet   Oral   Take 40 mg by mouth daily.         . pantoprazole (PROTONIX) 40 MG tablet      TAKE ONE TABLET BY MOUTH EVERY DAY   90 tablet   2   . pentoxifylline (TRENTAL) 400 MG CR tablet   Oral   Take 400 mg by mouth 3 (three) times daily.         . predniSONE (DELTASONE) 20 MG tablet   Oral   Take 1 tablet (20 mg total) by mouth daily.   5 tablet   0   . senna (SENNA CONCENTRATE) 8.6 MG tablet   Oral   Take 1 tablet by mouth daily.          . sevelamer (RENVELA) 800 MG tablet   Oral   Take 4,000 mg by mouth 3 (three) times daily with meals. Take five tablets three times a day before meals             BP 157/70  Pulse 89  Temp(Src) 98.3 F (36.8 C) (Oral)  Resp 20  SpO2 97%  Physical Exam  Nursing note and vitals reviewed. Constitutional: He is oriented to person, place, and time. He appears well-developed and well-nourished. No distress.  Obese.  HENT:  Head: Normocephalic and atraumatic.  Eyes: Conjunctivae and EOM are normal. Pupils are equal, round, and reactive to light.  Neck: Normal range of motion. Neck supple. No tracheal deviation present.  Cardiovascular: Normal rate, regular rhythm and  normal heart sounds.   Pulmonary/Chest: Effort normal and breath sounds normal. No respiratory distress.  Abdominal: Soft. Bowel sounds are normal. He exhibits no distension. There is no tenderness.  Musculoskeletal: Normal range of motion. He exhibits no edema.  Neurological: He is alert and oriented to person, place, and time.  Skin: Skin is warm and dry.  Psychiatric: He has a normal mood and affect. His behavior is normal.    ED Course  Procedures (including critical care time)  DIAGNOSTIC STUDIES: Oxygen Saturation is 97% on room air, normal by my interpretation.    COORDINATION OF CARE:  14:00-Discussed planned course of treatment with the patient, who is agreeable at this time.     Labs Reviewed - No data to display No results found. Results for orders placed during the hospital encounter of 02/14/13  BASIC METABOLIC PANEL      Result Value Range   Sodium 133 (*) 135 - 145 mEq/L   Potassium 3.6  3.5 - 5.1 mEq/L   Chloride 93 (*) 96 - 112 mEq/L   CO2 28  19 - 32 mEq/L   Glucose, Bld 97  70 - 99 mg/dL   BUN 17  6 - 23 mg/dL   Creatinine, Ser 2.95 (*) 0.50 - 1.35 mg/dL   Calcium 9.1  8.4 - 62.1 mg/dL   GFR calc non Af Amer 8 (*) >90 mL/min   GFR calc Af Amer 9 (*) >90 mL/min  CBC WITH DIFFERENTIAL      Result Value Range   WBC 4.9  4.0 - 10.5 K/uL   RBC 4.20 (*) 4.22 - 5.81 MIL/uL   Hemoglobin 12.9 (*) 13.0 - 17.0 g/dL   HCT 30.8 (*) 65.7 - 84.6 %   MCV 92.4  78.0 - 100.0 fL   MCH 30.7  26.0 - 34.0 pg   MCHC 33.2  30.0 - 36.0 g/dL   RDW 96.2  95.2 - 84.1 %   Platelets 174  150 - 400 K/uL   Neutrophils Relative 52  43 - 77 %   Neutro Abs 2.5  1.7 - 7.7 K/uL   Lymphocytes Relative 35  12 - 46 %   Lymphs Abs 1.7  0.7 - 4.0 K/uL   Monocytes Relative 13 (*) 3 - 12 %   Monocytes Absolute 0.6  0.1 - 1.0 K/uL   Eosinophils Relative 0  0 - 5 %   Eosinophils Absolute 0.0  0.0 - 0.7 K/uL   Basophils Relative 0  0 - 1 %   Basophils Absolute 0.0  0.0 - 0.1 K/uL   TROPONIN I      Result Value Range   Troponin I <0.30  <0.30 ng/mL    No diagnosis found.   Date: 02/14/2013  Rate: 85  Rhythm: normal sinus rhythm  QRS Axis: normal  Intervals: normal  ST/T Wave abnormalities: normal  Conduction Disutrbances: 1st degree av block  Narrative Interpretation: unremarkable      MDM  Cardiac enzyme and EKG are normal. Patient is feeling  better. Has primary care followup.    I personally performed the services described in this documentation, which was scribed in my presence. The recorded information has been reviewed and is accurate.      Donnetta Hutching, MD 02/14/13 [phone]

## 2013-02-14 NOTE — ED Notes (Signed)
Patient states that just prior to completing dialysis today, that he started having chest pain.  States that when his session was complete, his heart rate was in th 140s, however decreased on its own.  States that his chest pain stopped.  States that his chest pain returned around 1 pm, states that the dialysis center advised him that if the pain returned that he should go to the ER and be checked out.

## 2013-02-14 NOTE — ED Notes (Signed)
Patient given a Sprite per RN approval. 

## 2013-02-27 ENCOUNTER — Encounter: Payer: Self-pay | Admitting: Adult Health

## 2013-02-27 NOTE — Progress Notes (Signed)
HPI Albert Lewis is a pleasant 62 year old obese patient of Dr. Dietrich Pates we are seeing for ongoing followup and treatment of paroxysmal atrial fibrillation, hypertension, hypercholesterolemia, with history of end-stage renal disease with dialysis on Monday Wednesday Friday. The patient states he is on the transplant list via the [place]. Report has been started and he is awaiting further plans. He was seen in the ER on Feb 26th for recurrent chest pain during dialysis. He was treated and released without hospital admission.    Has been having chest burning, substernal on and off for about a month, usually during dialysis. Has mild diaphoresis. Putting on O2 [place] 1/liter and pain diminishes. Can occur when he exerting himself. Rest and O2 relieves. Last felt pain last week, Has not taken his BP medication today yet.  Allergies  Allergen Reactions  . Celebrex (Celecoxib)   . Penicillins Other (See Comments)    Couldn't walk.     Current Outpatient Prescriptions  Medication Sig Dispense Refill  . albuterol (PROVENTIL HFA;VENTOLIN HFA) 108 (90 BASE) MCG/ACT inhaler Inhale 2 puffs into the lungs every 6 (six) hours as needed for wheezing.  1 Inhaler  2  . allopurinol (ZYLOPRIM) 100 MG tablet Take 100 mg by mouth daily.       Marland Kitchen aspirin EC 81 MG tablet Take 81 mg by mouth daily.      . B Complex-C-Folic Acid (NEPHRO-VITE PO) Take 1 mg by mouth daily.       . cinacalcet (SENSIPAR) 30 MG tablet Take 30 mg by mouth daily.       . fish oil-omega-3 fatty acids 1000 MG capsule Take 1 g by mouth 3 (three) times daily.      . fluticasone (FLONASE) 50 MCG/ACT nasal spray Place 1 spray into the nose daily as needed. One puff per nostril daily for allergies       . gabapentin (NEURONTIN) 300 MG capsule Take 300 mg by mouth 2 (two) times daily.       Marland Kitchen gemfibrozil (LOPID) 600 MG tablet Take 600 mg by mouth 2 (two) times daily.      . insulin glargine (LANTUS) 100 UNIT/ML injection Inject 20 Units into the skin at  bedtime.      Marland Kitchen linagliptin (TRADJENTA) 5 MG TABS tablet Take 5 mg by mouth daily.      Marland Kitchen loratadine (CLARITIN) 10 MG tablet Take 10 mg by mouth daily as needed. Allergies      . losartan (COZAAR) 100 MG tablet Take 100 mg by mouth daily.      . metoprolol tartrate (LOPRESSOR) 25 MG tablet Take 25 mg by mouth 2 (two) times daily.      . pantoprazole (PROTONIX) 40 MG tablet Take 40 mg by mouth daily.      . pentoxifylline (TRENTAL) 400 MG CR tablet Take 400 mg by mouth 3 (three) times daily.      Marland Kitchen senna (SENNA CONCENTRATE) 8.6 MG tablet Take 1 tablet by mouth daily.       . sevelamer (RENVELA) 800 MG tablet Take 4,000 mg by mouth 3 (three) times daily with meals. Take five tablets three times a day before meals         No current facility-administered medications for this visit.    Past Medical History  Diagnosis Date  . Paroxysmal atrial fibrillation 2/09    Abnormal stress nuclear study in 01/2008; normal coronary angiography  . Hyperlipidemia 2008  . Hypertension 1993    Mild to moderate  LVH  . Diabetes mellitus, type 2 1993    no insulin  . ESRD (end stage renal disease) on dialysis     Dialysis since 2007  . Obesity     sleep apnea treated with BiPAP  . Anemia   . Gastroesophageal reflux disease   . Hyperparathyroidism     Secondary  . Allergic rhinitis   . Lower gastrointestinal bleed 04/2008    diverticulosis; hemorrhoids; constipation  . Diverticular disease   . Chronic back pain     Degenerative disc disease    Past Surgical History  Procedure Laterality Date  . Cholecystectomy  1999  . Exploratory laparotomy      shunt complications  . Cataract extraction w/ intraocular lens  implant, bilateral  2008  . Lumbar peritoneal shunt      ZOX:[id number] of systems complete and found to be negative unless listed above  PHYSICAL EXAM There were no vitals taken for this visit. General: Well developed, well nourished, in no acute distress Head: Eyes PERRLA, No xanthomas.    Normal cephalic and atramatic  Lungs: Clear bilaterally to auscultation and percussion. Heart: HRRR S1 S2, 1/6 systolic murmur, .  Pulses are 2+ & equal.            No carotid bruit. No JVD.  No abdominal bruits. No femoral bruits. Abdomen: Bowel sounds are positive, abdomen soft and non-tender without masses or                  Hernia's noted. Msk:  Back normal, normal gait. Normal strength and tone for age. Extremities: No clubbing, cyanosis or edema. Left arm AV fistula.  DP +1 Neuro: Alert and oriented X 3. Psych:  Good affect, responds appropriately    ASSESSMENT AND PLAN

## 2013-03-01 ENCOUNTER — Encounter: Payer: Self-pay | Admitting: Adult Health

## 2013-03-01 ENCOUNTER — Ambulatory Visit (INDEPENDENT_AMBULATORY_CARE_PROVIDER_SITE_OTHER): Payer: Medicare Other | Admitting: Adult Health

## 2013-03-01 VITALS — BP 157/70 | HR 92 | Ht 74.0 in | Wt 259.0 lb

## 2013-03-01 DIAGNOSIS — K219 Gastro-esophageal reflux disease without esophagitis: Secondary | ICD-10-CM

## 2013-03-01 DIAGNOSIS — I4891 Unspecified atrial fibrillation: Secondary | ICD-10-CM

## 2013-03-01 DIAGNOSIS — I10 Essential (primary) hypertension: Secondary | ICD-10-CM

## 2013-03-01 NOTE — Assessment & Plan Note (Signed)
Heart rate is well-controlled, he continues on metoprolol 25 mg twice a day. No changes in his medication regimen.

## 2013-03-01 NOTE — Patient Instructions (Addendum)
Continue same medication.  Your physician wants you to follow-up in: 6 months.  You will receive a reminder letter in the mail two months in advance. If you don't receive a letter, please call our office to schedule the follow-up appointment.  

## 2013-03-01 NOTE — Assessment & Plan Note (Signed)
Blood pressure is mildly elevated today. Review of transfer shows labile blood pressure. He admits to not taking his medications today prior to office visit. He states he also does not like to take his antihypertensives on the days of dialysis as he feels very dizzy and lightheaded if they are in his system during the procedure. He states he will take his blood pressure medicine when he arrives home today. I have advised him to take his blood pressure medicine before he comes the next visit so that we may ascertain whether his medications are working appropriately for him. He verbalizes understanding.

## 2013-03-01 NOTE — Assessment & Plan Note (Signed)
His complaints of chest burning and indigestion appear to be more related to GERD symptoms vs. cardiac etiology. He notices it most when he is having dialysis or when he bends over. Oxygen and rest to help it. He is due to see GI for EGD and colonoscopy beginning of next week. At this time we will not pursue any cardiac testing until EGD and colonoscopy are completed to rule out gastritis, and other GI etiology for discomfort. If nothing is found we will proceed with a repeat stress test.

## 2013-05-16 ENCOUNTER — Emergency Department (HOSPITAL_COMMUNITY)
Admission: EM | Admit: 2013-05-16 | Discharge: 2013-05-17 | Disposition: A | Discharge status: Home / Self Care | Payer: Medicare Other | Attending: Emergency Medicine | Admitting: Emergency Medicine

## 2013-05-16 ENCOUNTER — Encounter (HOSPITAL_COMMUNITY): Payer: Self-pay | Admitting: *Deleted

## 2013-05-16 ENCOUNTER — Emergency Department (HOSPITAL_COMMUNITY): Payer: Medicare Other

## 2013-05-16 DIAGNOSIS — Z880 Allergy status to penicillin: Secondary | ICD-10-CM | POA: Insufficient documentation

## 2013-05-16 DIAGNOSIS — Z8719 Personal history of other diseases of the digestive system: Secondary | ICD-10-CM | POA: Insufficient documentation

## 2013-05-16 DIAGNOSIS — I120 Hypertensive chronic kidney disease with stage 5 chronic kidney disease or end stage renal disease: Secondary | ICD-10-CM | POA: Insufficient documentation

## 2013-05-16 DIAGNOSIS — N186 End stage renal disease: Secondary | ICD-10-CM | POA: Insufficient documentation

## 2013-05-16 DIAGNOSIS — Z8639 Personal history of other endocrine, nutritional and metabolic disease: Secondary | ICD-10-CM | POA: Insufficient documentation

## 2013-05-16 DIAGNOSIS — E119 Type 2 diabetes mellitus without complications: Secondary | ICD-10-CM | POA: Insufficient documentation

## 2013-05-16 DIAGNOSIS — Z992 Dependence on renal dialysis: Secondary | ICD-10-CM | POA: Insufficient documentation

## 2013-05-16 DIAGNOSIS — K219 Gastro-esophageal reflux disease without esophagitis: Secondary | ICD-10-CM | POA: Insufficient documentation

## 2013-05-16 DIAGNOSIS — Z794 Long term (current) use of insulin: Secondary | ICD-10-CM | POA: Insufficient documentation

## 2013-05-16 DIAGNOSIS — G8929 Other chronic pain: Secondary | ICD-10-CM | POA: Insufficient documentation

## 2013-05-16 DIAGNOSIS — D649 Anemia, unspecified: Secondary | ICD-10-CM | POA: Insufficient documentation

## 2013-05-16 DIAGNOSIS — Z7982 Long term (current) use of aspirin: Secondary | ICD-10-CM | POA: Insufficient documentation

## 2013-05-16 DIAGNOSIS — M7989 Other specified soft tissue disorders: Secondary | ICD-10-CM | POA: Insufficient documentation

## 2013-05-16 DIAGNOSIS — IMO0002 Reserved for concepts with insufficient information to code with codable children: Secondary | ICD-10-CM | POA: Insufficient documentation

## 2013-05-16 DIAGNOSIS — M549 Dorsalgia, unspecified: Secondary | ICD-10-CM | POA: Insufficient documentation

## 2013-05-16 DIAGNOSIS — E669 Obesity, unspecified: Secondary | ICD-10-CM | POA: Insufficient documentation

## 2013-05-16 DIAGNOSIS — Z87891 Personal history of nicotine dependence: Secondary | ICD-10-CM | POA: Insufficient documentation

## 2013-05-16 DIAGNOSIS — Z79899 Other long term (current) drug therapy: Secondary | ICD-10-CM | POA: Insufficient documentation

## 2013-05-16 DIAGNOSIS — Z862 Personal history of diseases of the blood and blood-forming organs and certain disorders involving the immune mechanism: Secondary | ICD-10-CM | POA: Insufficient documentation

## 2013-05-16 DIAGNOSIS — Z8669 Personal history of other diseases of the nervous system and sense organs: Secondary | ICD-10-CM | POA: Insufficient documentation

## 2013-05-16 DIAGNOSIS — R209 Unspecified disturbances of skin sensation: Secondary | ICD-10-CM | POA: Insufficient documentation

## 2013-05-16 DIAGNOSIS — I4891 Unspecified atrial fibrillation: Secondary | ICD-10-CM | POA: Insufficient documentation

## 2013-05-16 LAB — TROPONIN I: Troponin I: <0.3 ng/mL (ref <0.30)

## 2013-05-16 LAB — BASIC METABOLIC PANEL
CO2: 28 mEq/L (ref 19–32)
Chloride: 95 mEq/L — ABNORMAL LOW (ref 96–112)
GFR calc non Af Amer: 6 mL/min — ABNORMAL LOW (ref >90)
Glucose, Bld: 135 mg/dL — ABNORMAL HIGH (ref 70–99)
Potassium: 4 mEq/L (ref 3.5–5.1)
Sodium: 137 mEq/L (ref 135–145)

## 2013-05-16 LAB — CBC WITH DIFFERENTIAL/PLATELET
Eosinophils Absolute: 0 10*3/uL (ref 0.0–0.7)
Lymphocytes Relative: 26 % (ref 12–46)
Lymphs Abs: 1.4 10*3/uL (ref 0.7–4.0)
Neutro Abs: 3.3 10*3/uL (ref 1.7–7.7)
Neutrophils Relative %: 59 % (ref 43–77)
Platelets: 176 10*3/uL (ref 150–400)
RBC: 3.71 MIL/uL — ABNORMAL LOW (ref 4.22–5.81)
WBC: 5.5 10*3/uL (ref 4.0–10.5)

## 2013-05-16 NOTE — ED Notes (Signed)
Pt states swelling to lower extremities x 2 weeks. Denies SOB or any other symptoms.

## 2013-05-16 NOTE — ED Provider Notes (Addendum)
History    Scribed for Shelda Jakes, MD, the patient was seen in room APA11/APA11. This chart was scribed by Lewanda Rife, ED scribe. Patient's care was started at 2201    CSN: [id number]  Arrival date & time 05/16/13  1735   First MD Initiated Contact with Patient 05/16/13 2118      Chief Complaint  Patient presents with  . Leg Swelling    (Consider location/radiation/quality/duration/timing/severity/associated sxs/prior treatment) The history is provided by the patient.   HPI Comments: Albert Lewis is a 62 y.o. male who presents to the Emergency Department complaining of constant moderate bilateral lower extremity swelling onset 2 weeks. Reports associated non-radiating moderate pain of lower extremities with numbess in bilateral feet. Denies aggravating or alleviating factors. Reports being dialyzed today. Denies chest pain, shortness of breath, abdominal pain, nausea, emesis, diarrhea, fever, visual disturbances, neck pain, rash, and bleeding disorder. Denies hx of the similar symptoms. Reports hx diabetes, back pain, atrial fibrillation, and hypertension. Reports having dialysis since 2008. PCP Dr. Lodema Hong  Past Medical History  Diagnosis Date  . Paroxysmal atrial fibrillation 2/09    Abnormal stress nuclear study in 01/2008; normal coronary angiography  . Hyperlipidemia 2008  . Hypertension 1993    Mild to moderate LVH  . Diabetes mellitus, type 2 1993    no insulin  . ESRD (end stage renal disease) on dialysis     Dialysis since 2007  . Obesity     sleep apnea treated with BiPAP  . Anemia   . Gastroesophageal reflux disease   . Hyperparathyroidism     Secondary  . Allergic rhinitis   . Lower gastrointestinal bleed 04/2008    diverticulosis; hemorrhoids; constipation  . Diverticular disease   . Chronic back pain     Degenerative disc disease    Past Surgical History  Procedure Laterality Date  . Cholecystectomy  1999  . Exploratory laparotomy     shunt complications  . Cataract extraction w/ intraocular lens  implant, bilateral  2008  . Lumbar peritoneal shunt      Family History  Problem Relation Age of Onset  . Cancer Mother   . Hypertension Mother   . Hypertension Sister 40  . Hypertension Daughter 30  . Diabetes Daughter     History  Substance Use Topics  . Smoking status: Former Smoker    Types: Cigarettes  . Smokeless tobacco: Not on file  . Alcohol Use: No      Review of Systems  Constitutional: Negative for fever.  HENT: Negative for rhinorrhea and neck pain.   Eyes: Negative for visual disturbance.  Respiratory: Negative for shortness of breath.   Cardiovascular: Positive for leg swelling. Negative for chest pain.  Gastrointestinal: Negative for nausea, vomiting, abdominal pain and diarrhea.  Musculoskeletal: Positive for myalgias (lower extremities). Negative for back pain.  Skin: Negative for rash.  Neurological: Positive for numbness.  Hematological: Does not bruise/bleed easily.  Psychiatric/Behavioral: Negative for confusion.   A complete 10 system review of systems was obtained and all systems are negative except as noted in the HPI and PMH.     Allergies  Celebrex and Penicillins  Home Medications   Current Outpatient Rx  Name  Route  Sig  Dispense  Refill  . allopurinol (ZYLOPRIM) 100 MG tablet   Oral   Take 100 mg by mouth daily.          Marland Kitchen aspirin EC 81 MG tablet   Oral   Take 81  mg by mouth daily.         . B Complex-C-Folic Acid (RENAL MULTIVITAMIN FORMULA PO)   Oral   Take 1 tablet by mouth daily.         . cinacalcet (SENSIPAR) 60 MG tablet   Oral   Take 60 mg by mouth daily.         Marland Kitchen docusate sodium (COLACE) 100 MG capsule   Oral   Take 200 mg by mouth 2 (two) times daily.         . fish oil-omega-3 fatty acids 1000 MG capsule   Oral   Take 1 g by mouth 3 (three) times daily. SUPER OMEGA 3 FORMULA: EPA 300/DHA200         . gabapentin (NEURONTIN) 300 MG  capsule   Oral   Take 300 mg by mouth 2 (two) times daily.          Marland Kitchen gemfibrozil (LOPID) 600 MG tablet   Oral   Take 600 mg by mouth 2 (two) times daily.         Marland Kitchen HYDROcodone-acetaminophen (NORCO/VICODIN) 5-325 MG per tablet   Oral   Take 1 tablet by mouth 2 (two) times daily as needed for pain.         Marland Kitchen insulin glargine (LANTUS) 100 UNIT/ML injection   Subcutaneous   Inject 20 Units into the skin at bedtime.         Marland Kitchen ketoprofen (ORUDIS) 75 MG capsule   Oral   Take 75 mg by mouth 3 (three) times daily.         Marland Kitchen losartan (COZAAR) 100 MG tablet   Oral   Take 100 mg by mouth daily.         . metoprolol tartrate (LOPRESSOR) 25 MG tablet   Oral   Take 25 mg by mouth 2 (two) times daily.         . pantoprazole (PROTONIX) 40 MG tablet   Oral   Take 40 mg by mouth daily.         . pentoxifylline (TRENTAL) 400 MG CR tablet   Oral   Take 400 mg by mouth 3 (three) times daily.         . sevelamer (RENVELA) 800 MG tablet   Oral   Take 4,000 mg by mouth 3 (three) times daily with meals. Take five tablets three times a day before meals           . albuterol (PROVENTIL HFA;VENTOLIN HFA) 108 (90 BASE) MCG/ACT inhaler   Inhalation   Inhale 2 puffs into the lungs every 6 (six) hours as needed for wheezing.   1 Inhaler   2   . HYDROcodone-acetaminophen (NORCO/VICODIN) 5-325 MG per tablet   Oral   Take 1-2 tablets by mouth every 12 (twelve) hours.   14 tablet   0   . loratadine (CLARITIN) 10 MG tablet   Oral   Take 10 mg by mouth daily as needed. Allergies           BP 142/57  Pulse 74  Temp(Src) 98.6 F (37 C) (Oral)  Resp 18  SpO2 97%  Physical Exam  Nursing note and vitals reviewed. Constitutional: He is oriented to person, place, and time. He appears well-developed and well-nourished. No distress.  HENT:  Head: Normocephalic and atraumatic.  Eyes: Conjunctivae and EOM are normal.  Neck: Normal range of motion. Neck supple. No tracheal  deviation present.  Cardiovascular: Normal rate, regular rhythm and  normal heart sounds.   No murmur heard. Cap refill < 3 seconds on feet   Pulmonary/Chest: Effort normal and breath sounds normal. No respiratory distress. He has no wheezes. He has no rales. He exhibits no tenderness.  Abdominal: Soft. Bowel sounds are normal. He exhibits no distension. There is no tenderness.  Musculoskeletal: Normal range of motion. He exhibits edema (+ 1 edema of bilateral feet).  Neurological: He is alert and oriented to person, place, and time.  Normal tracking of eyes.   Skin: Skin is warm and dry.  Psychiatric: He has a normal mood and affect. His behavior is normal.    ED Course  Procedures (including critical care time) Medications  HYDROcodone-acetaminophen (NORCO/VICODIN) 5-325 MG per tablet 1 tablet (not administered)    Labs Reviewed  CBC WITH DIFFERENTIAL - Abnormal; Notable for the following:    RBC 3.71 (*)    Hemoglobin 11.8 (*)    HCT 35.4 (*)    Monocytes Relative 15 (*)    All other components within normal limits  BASIC METABOLIC PANEL - Abnormal; Notable for the following:    Chloride 95 (*)    Glucose, Bld 135 (*)    BUN 31 (*)    Creatinine, Ser 8.00 (*)    GFR calc non Af Amer 6 (*)    GFR calc Af Amer 7 (*)    All other components within normal limits  TROPONIN I   Dg Chest 2 View  05/17/2013   *RADIOLOGY REPORT*  Clinical Data: Bilateral lower leg swelling for 2 weeks.  History of smoking.  CHEST - 2 VIEW  Comparison: Chest radiograph from 02/14/2013  Findings: The lungs are well-aerated.  Pulmonary vascularity is at the upper limits of normal, grossly unchanged from the prior study. There is no evidence of focal opacification, pleural effusion or pneumothorax.  The heart is normal in size; the mediastinal contour is within normal limits.  No acute osseous abnormalities are seen.  Anterior bridging osteophytes are seen along the thoracic spine.  IMPRESSION: No acute  cardiopulmonary process seen.   Original Report Authenticated By: Tonia Ghent, M.D.     Date: 05/16/2013  Rate: 73  Rhythm: normal sinus rhythm  QRS Axis: normal  Intervals: normal  ST/T Wave abnormalities: normal  Conduction Disutrbances:first-degree A-V block   Narrative Interpretation:   Old EKG Reviewed: unchanged No changes in EKG compared to 04/14/2013  Results for orders placed during the hospital encounter of 05/16/13  TROPONIN I      Result Value Range   Troponin I <0.30  <0.30 ng/mL  CBC WITH DIFFERENTIAL      Result Value Range   WBC 5.5  4.0 - 10.5 K/uL   RBC 3.71 (*) 4.22 - 5.81 MIL/uL   Hemoglobin 11.8 (*) 13.0 - 17.0 g/dL   HCT 16.1 (*) 09.6 - 04.5 %   MCV 95.4  78.0 - 100.0 fL   MCH 31.8  26.0 - 34.0 pg   MCHC 33.3  30.0 - 36.0 g/dL   RDW 40.9  81.1 - 91.4 %   Platelets 176  150 - 400 K/uL   Neutrophils Relative % 59  43 - 77 %   Neutro Abs 3.3  1.7 - 7.7 K/uL   Lymphocytes Relative 26  12 - 46 %   Lymphs Abs 1.4  0.7 - 4.0 K/uL   Monocytes Relative 15 (*) 3 - 12 %   Monocytes Absolute 0.8  0.1 - 1.0 K/uL   Eosinophils Relative  0  0 - 5 %   Eosinophils Absolute 0.0  0.0 - 0.7 K/uL   Basophils Relative 0  0 - 1 %   Basophils Absolute 0.0  0.0 - 0.1 K/uL  BASIC METABOLIC PANEL      Result Value Range   Sodium 137  135 - 145 mEq/L   Potassium 4.0  3.5 - 5.1 mEq/L   Chloride 95 (*) 96 - 112 mEq/L   CO2 28  19 - 32 mEq/L   Glucose, Bld 135 (*) 70 - 99 mg/dL   BUN 31 (*) 6 - 23 mg/dL   Creatinine, Ser 4.09 (*) 0.50 - 1.35 mg/dL   Calcium 9.9  8.4 - 81.1 mg/dL   GFR calc non Af Amer 6 (*) >90 mL/min   GFR calc Af Amer 7 (*) >90 mL/min      1. Bilateral swelling of feet       MDM  Specific cause of the bilateral feet swelling is not clear. Patient is a renal dialysis patient does not make any urine. Lites here today are appropriate for dialysis patient. No evidence of hyperkalemia. No leukocytosis. Troponin was negative chest x-ray negative for  any pulmonary edema pneumonia. Patient thinks it could be gout related to be extremely unlikely that patient would get gout bilaterally mostly in the upper part of his feet simultaneously starting 2 weeks ago. Specific cause of the swelling is not clear. Will treat with some mild pain medications. Patient will followup with primary care Dr.    I personally performed the services described in this documentation, which was scribed in my presence. The recorded information has been reviewed and is accurate.    Shelda Jakes, MD 05/17/13 [id number]  Shelda Jakes, MD 05/17/13 [phone]

## 2013-05-17 MED ORDER — HYDROCODONE-ACETAMINOPHEN 5-325 MG PO TABS
1.0000 | ORAL_TABLET | Freq: Once | ORAL | Status: AC
  Administered 2013-05-17: 1 via ORAL
  Filled 2013-05-17: qty 1

## 2013-05-17 MED ORDER — HYDROCODONE-ACETAMINOPHEN 5-325 MG PO TABS: 1.0000 | ORAL_TABLET | Freq: Two times a day (BID) | ORAL | Status: DC

## 2013-07-18 ENCOUNTER — Emergency Department (HOSPITAL_COMMUNITY): Payer: Medicare Other

## 2013-07-18 ENCOUNTER — Encounter (HOSPITAL_COMMUNITY): Payer: Self-pay | Admitting: Emergency Medicine

## 2013-07-18 ENCOUNTER — Other Ambulatory Visit: Payer: Self-pay

## 2013-07-18 ENCOUNTER — Emergency Department (HOSPITAL_COMMUNITY)
Admission: EM | Admit: 2013-07-18 | Discharge: 2013-07-18 | Disposition: A | Discharge status: Home / Self Care | Payer: Medicare Other | Attending: Emergency Medicine | Admitting: Emergency Medicine

## 2013-07-18 DIAGNOSIS — G8929 Other chronic pain: Secondary | ICD-10-CM | POA: Insufficient documentation

## 2013-07-18 DIAGNOSIS — Z8719 Personal history of other diseases of the digestive system: Secondary | ICD-10-CM | POA: Insufficient documentation

## 2013-07-18 DIAGNOSIS — E785 Hyperlipidemia, unspecified: Secondary | ICD-10-CM | POA: Insufficient documentation

## 2013-07-18 DIAGNOSIS — Z992 Dependence on renal dialysis: Secondary | ICD-10-CM | POA: Insufficient documentation

## 2013-07-18 DIAGNOSIS — R0789 Other chest pain: Secondary | ICD-10-CM | POA: Insufficient documentation

## 2013-07-18 DIAGNOSIS — Z880 Allergy status to penicillin: Secondary | ICD-10-CM | POA: Insufficient documentation

## 2013-07-18 DIAGNOSIS — Z7982 Long term (current) use of aspirin: Secondary | ICD-10-CM | POA: Insufficient documentation

## 2013-07-18 DIAGNOSIS — E119 Type 2 diabetes mellitus without complications: Secondary | ICD-10-CM | POA: Insufficient documentation

## 2013-07-18 DIAGNOSIS — Z79899 Other long term (current) drug therapy: Secondary | ICD-10-CM | POA: Insufficient documentation

## 2013-07-18 DIAGNOSIS — I129 Hypertensive chronic kidney disease with stage 1 through stage 4 chronic kidney disease, or unspecified chronic kidney disease: Secondary | ICD-10-CM | POA: Insufficient documentation

## 2013-07-18 DIAGNOSIS — K219 Gastro-esophageal reflux disease without esophagitis: Secondary | ICD-10-CM | POA: Insufficient documentation

## 2013-07-18 DIAGNOSIS — E669 Obesity, unspecified: Secondary | ICD-10-CM | POA: Insufficient documentation

## 2013-07-18 DIAGNOSIS — Z794 Long term (current) use of insulin: Secondary | ICD-10-CM | POA: Insufficient documentation

## 2013-07-18 DIAGNOSIS — Z862 Personal history of diseases of the blood and blood-forming organs and certain disorders involving the immune mechanism: Secondary | ICD-10-CM | POA: Insufficient documentation

## 2013-07-18 DIAGNOSIS — Z8639 Personal history of other endocrine, nutritional and metabolic disease: Secondary | ICD-10-CM | POA: Insufficient documentation

## 2013-07-18 DIAGNOSIS — Z9889 Other specified postprocedural states: Secondary | ICD-10-CM | POA: Insufficient documentation

## 2013-07-18 DIAGNOSIS — R079 Chest pain, unspecified: Secondary | ICD-10-CM

## 2013-07-18 DIAGNOSIS — Z8679 Personal history of other diseases of the circulatory system: Secondary | ICD-10-CM | POA: Insufficient documentation

## 2013-07-18 DIAGNOSIS — Z87891 Personal history of nicotine dependence: Secondary | ICD-10-CM | POA: Insufficient documentation

## 2013-07-18 DIAGNOSIS — N186 End stage renal disease: Secondary | ICD-10-CM | POA: Insufficient documentation

## 2013-07-18 DIAGNOSIS — Z8709 Personal history of other diseases of the respiratory system: Secondary | ICD-10-CM | POA: Insufficient documentation

## 2013-07-18 LAB — CBC WITH DIFFERENTIAL/PLATELET
Eosinophils Absolute: 0 10*3/uL (ref 0.0–0.7)
Hemoglobin: 12 g/dL — ABNORMAL LOW (ref 13.0–17.0)
Lymphs Abs: 1.3 10*3/uL (ref 0.7–4.0)
MCH: 32.2 pg (ref 26.0–34.0)
Monocytes Relative: 8 % (ref 3–12)
Neutrophils Relative %: 70 % (ref 43–77)
RBC: 3.73 MIL/uL — ABNORMAL LOW (ref 4.22–5.81)

## 2013-07-18 LAB — BASIC METABOLIC PANEL
BUN: 23 mg/dL (ref 6–23)
Chloride: 93 mEq/L — ABNORMAL LOW (ref 96–112)
GFR calc non Af Amer: 9 mL/min — ABNORMAL LOW (ref >90)
Glucose, Bld: 229 mg/dL — ABNORMAL HIGH (ref 70–99)
Potassium: 4.1 mEq/L (ref 3.5–5.1)

## 2013-07-18 MED ORDER — ASPIRIN 81 MG PO CHEW
324.0000 mg | CHEWABLE_TABLET | Freq: Once | ORAL | Status: AC
  Administered 2013-07-18: 324 mg via ORAL
  Filled 2013-07-18: qty 4

## 2013-07-18 MED ORDER — ASPIRIN 81 MG PO CHEW
CHEWABLE_TABLET | ORAL | Status: AC
  Filled 2013-07-18: qty 1

## 2013-07-18 MED ORDER — NITROGLYCERIN 0.4 MG SL SUBL
0.4000 mg | SUBLINGUAL_TABLET | SUBLINGUAL | Status: DC | PRN
  Administered 2013-07-18: 0.4 mg via SUBLINGUAL

## 2013-07-18 NOTE — ED Notes (Signed)
Appt set by [facility] to be at Radiology at 8:30 on Monday Aug. 4 for [name].  Patient is to be NPO after midnight on Sunday Aug 3rd.  Pt is to FU with Santina Evans at [place] at 1:40 pm on  Tuesday,August 12th.

## 2013-07-18 NOTE — ED Notes (Signed)
nad noted prior to dc. Dc instructions reviewed along with f/u appt. Pt voiced understanding. Ambulated out without difficulty.

## 2013-07-18 NOTE — ED Notes (Signed)
Pt c/o generalized dull cp after receiving dialysis today. Pt given 3 x 0.4mg  sl nitroglycerin pta- states cp decreased from 9 to 6. nad noted.

## 2013-07-18 NOTE — ED Notes (Signed)
Pt asleep at this time. Even/rise fall of chest. nad noted.

## 2013-07-18 NOTE — ED Provider Notes (Signed)
CSN: [id number]     Arrival date & time 07/18/13  1028 History     First MD Initiated Contact with Patient 07/18/13 1044     Chief Complaint  Patient presents with  . Chest Pain   (Consider location/radiation/quality/duration/timing/severity/associated sxs/prior Treatment) Patient is a 62 y.o. male presenting with chest pain. The history is provided by the patient.  Chest Pain Pain location:  L chest Pain quality: dull   Pain radiates to:  Does not radiate Pain radiates to the back: no   Pain severity now: 6/10. Onset quality:  Sudden Duration:  90 minutes Timing:  Constant Progression:  Improving Chronicity:  Recurrent Context comment:  Started near the end of dialysis Relieved by:  Nitroglycerin Worsened by:  Certain positions and movement Associated symptoms: no abdominal pain, no back pain, no dizziness, no fever, no shortness of breath and not vomiting     Past Medical History  Diagnosis Date  . Paroxysmal atrial fibrillation 2/09    Abnormal stress nuclear study in 01/2008; normal coronary angiography  . Hyperlipidemia 2008  . Hypertension 1993    Mild to moderate LVH  . Diabetes mellitus, type 2 1993    no insulin  . ESRD (end stage renal disease) on dialysis     Dialysis since 2007  . Obesity     sleep apnea treated with BiPAP  . Anemia   . Gastroesophageal reflux disease   . Hyperparathyroidism     Secondary  . Allergic rhinitis   . Lower gastrointestinal bleed 04/2008    diverticulosis; hemorrhoids; constipation  . Diverticular disease   . Chronic back pain     Degenerative disc disease   Past Surgical History  Procedure Laterality Date  . Cholecystectomy  1999  . Exploratory laparotomy      shunt complications  . Cataract extraction w/ intraocular lens  implant, bilateral  2008  . Lumbar peritoneal shunt     Family History  Problem Relation Age of Onset  . Cancer Mother   . Hypertension Mother   . Hypertension Sister 79  . Hypertension  Daughter 30  . Diabetes Daughter    History  Substance Use Topics  . Smoking status: Former Smoker    Types: Cigarettes  . Smokeless tobacco: Not on file  . Alcohol Use: No    Review of Systems  Constitutional: Negative for fever.  Respiratory: Negative for shortness of breath.   Cardiovascular: Positive for chest pain. Negative for leg swelling.  Gastrointestinal: Negative for vomiting and abdominal pain.  Musculoskeletal: Negative for back pain.  Neurological: Negative for dizziness.  All other systems reviewed and are negative.    Allergies  Celebrex and Penicillins  Home Medications   Current Outpatient Rx  Name  Route  Sig  Dispense  Refill  . allopurinol (ZYLOPRIM) 100 MG tablet   Oral   Take 100 mg by mouth daily.          Marland Kitchen aspirin EC 81 MG tablet   Oral   Take 81 mg by mouth daily.         . B Complex-C-Folic Acid (RENAL MULTIVITAMIN FORMULA PO)   Oral   Take 1 tablet by mouth daily.         . cinacalcet (SENSIPAR) 60 MG tablet   Oral   Take 60 mg by mouth daily.         Marland Kitchen docusate sodium (COLACE) 100 MG capsule   Oral   Take 200 mg by mouth 2 (  two) times daily.         . fish oil-omega-3 fatty acids 1000 MG capsule   Oral   Take 1 g by mouth 3 (three) times daily. SUPER OMEGA 3 FORMULA: EPA 300/DHA200         . gabapentin (NEURONTIN) 300 MG capsule   Oral   Take 300 mg by mouth 2 (two) times daily.          Marland Kitchen gemfibrozil (LOPID) 600 MG tablet   Oral   Take 600 mg by mouth 2 (two) times daily.         Marland Kitchen HYDROcodone-acetaminophen (NORCO/VICODIN) 5-325 MG per tablet   Oral   Take 1 tablet by mouth 2 (two) times daily as needed for pain.         Marland Kitchen insulin glargine (LANTUS) 100 UNIT/ML injection   Subcutaneous   Inject 22 Units into the skin at bedtime.          Marland Kitchen ketoprofen (ORUDIS) 75 MG capsule   Oral   Take 75 mg by mouth 3 (three) times daily.         Marland Kitchen losartan (COZAAR) 100 MG tablet   Oral   Take 100 mg by  mouth daily.         . metoprolol tartrate (LOPRESSOR) 25 MG tablet   Oral   Take 25 mg by mouth 2 (two) times daily.         . pantoprazole (PROTONIX) 40 MG tablet   Oral   Take 40 mg by mouth daily.         . pentoxifylline (TRENTAL) 400 MG CR tablet   Oral   Take 400 mg by mouth 3 (three) times daily.         . sevelamer (RENVELA) 800 MG tablet   Oral   Take 4,800 mg by mouth 3 (three) times daily with meals. Takes 6 tablets (4800 mg) three times daily with meals and 3 tablets (2400 mg) with snack.         Marland Kitchen albuterol (PROVENTIL HFA;VENTOLIN HFA) 108 (90 BASE) MCG/ACT inhaler   Inhalation   Inhale 2 puffs into the lungs every 6 (six) hours as needed for wheezing.   1 Inhaler   2   . loratadine (CLARITIN) 10 MG tablet   Oral   Take 10 mg by mouth daily as needed. Allergies          BP 153/63  Pulse 75  Temp(Src) 98.7 F (37.1 C)  Resp 20  Ht 6\' 2"  (1.88 m)  Wt 258 lb (117.028 kg)  BMI 33.11 kg/m2  SpO2 99% Physical Exam  Nursing note and vitals reviewed. Constitutional: He is oriented to person, place, and time. He appears well-developed and well-nourished.  HENT:  Head: Normocephalic and atraumatic.  Right Ear: External ear normal.  Left Ear: External ear normal.  Nose: Nose normal.  Eyes: Right eye exhibits no discharge. Left eye exhibits no discharge.  Neck: Neck supple.  Cardiovascular: Normal rate, regular rhythm, normal heart sounds and intact distal pulses.   Pulmonary/Chest: Effort normal. He exhibits tenderness (point tenderness over left medial chest).  Abdominal: Soft. There is no tenderness.  Musculoskeletal: He exhibits no edema.  Neurological: He is alert and oriented to person, place, and time.  Skin: Skin is warm and dry.    ED Course   Procedures (including critical care time)  Labs Reviewed  CBC WITH DIFFERENTIAL - Abnormal; Notable for the following:    RBC 3.73 (*)  Hemoglobin 12.0 (*)    HCT 35.4 (*)    All other  components within normal limits  BASIC METABOLIC PANEL - Abnormal; Notable for the following:    Sodium 133 (*)    Chloride 93 (*)    Glucose, Bld 229 (*)    Creatinine, Ser 6.06 (*)    GFR calc non Af Amer 9 (*)    GFR calc Af Amer 10 (*)    All other components within normal limits  TROPONIN I  TROPONIN I    Date: 07/18/2013  Rate: 74  Rhythm: normal sinus rhythm  QRS Axis: normal  Intervals: PR prolonged  ST/T Wave abnormalities: normal  Conduction Disutrbances:first-degree A-V block   Narrative Interpretation: no acute ischemia  Old EKG Reviewed: unchanged   Dg Chest 2 View  07/18/2013   *RADIOLOGY REPORT*  Clinical Data: Chest pain  CHEST - 2 VIEW  Comparison: Apr 26, 2013.  Findings: Cardiomediastinal silhouette appears normal.  No acute pulmonary disease is noted.  Bony thorax is intact.  IMPRESSION: No acute cardiopulmonary abnormality seen.   Original Report Authenticated By: Lupita Raider.,  M.D.   1. Chest pain     MDM  Atypical chest pain that started during dialysis. EKG benign. Improved with nitroglycerin and rest. With atypical presentation (during dialysis, point tender, worse with arm movement), feel ACS is less likely. Doubt dissection or PE based on presentation. Pericarditis is less likely, no inspiratory component or enlarged heart compared to baseline. With 2 negative troponins, atypical story and benign EKG, feel that an MI/ACS is unlikely. Discussed with Dr. Excell Seltzer of cardiology at [facility], and have arranged close f/u and outpatient stress for Mr. Pardo. Discussed with patient, who is in agreement and understands return precautions.  Audree Camel, MD 07/18/13 [phone]

## 2013-07-18 NOTE — ED Notes (Signed)
Resting in bed with nad noted. Watching tv. Denies any pain

## 2013-07-19 ENCOUNTER — Other Ambulatory Visit: Payer: Self-pay | Admitting: *Deleted

## 2013-07-19 DIAGNOSIS — R079 Chest pain, unspecified: Secondary | ICD-10-CM

## 2013-07-23 ENCOUNTER — Encounter (HOSPITAL_COMMUNITY): Admission: RE | Admit: 2013-07-23 | Payer: Medicare Other | Source: Ambulatory Visit

## 2013-07-23 ENCOUNTER — Encounter (HOSPITAL_COMMUNITY)
Admission: RE | Admit: 2013-07-23 | Discharge: 2013-07-23 | Disposition: A | Discharge status: Home / Self Care | Payer: Medicare Other | Source: Ambulatory Visit | Attending: Cardiovascular Disease | Admitting: Cardiovascular Disease

## 2013-07-23 DIAGNOSIS — R079 Chest pain, unspecified: Secondary | ICD-10-CM

## 2013-07-31 ENCOUNTER — Encounter (HOSPITAL_COMMUNITY)
Admission: RE | Admit: 2013-07-31 | Discharge: 2013-07-31 | Disposition: A | Discharge status: Home / Self Care | Payer: Medicare Other | Source: Ambulatory Visit | Attending: Cardiovascular Disease | Admitting: Cardiovascular Disease

## 2013-07-31 ENCOUNTER — Encounter (HOSPITAL_COMMUNITY): Payer: Self-pay

## 2013-07-31 ENCOUNTER — Ambulatory Visit: Payer: Medicare Other | Admitting: Adult Health

## 2013-07-31 DIAGNOSIS — R079 Chest pain, unspecified: Secondary | ICD-10-CM | POA: Insufficient documentation

## 2013-07-31 HISTORY — DX: Disorder of kidney and ureter, unspecified: N28.9

## 2013-07-31 MED ORDER — TECHNETIUM TC 99M SESTAMIBI - CARDIOLITE
30.0000 | Freq: Once | INTRAVENOUS | Status: AC | PRN
  Administered 2013-07-31: 12:00:00 30 via INTRAVENOUS

## 2013-07-31 MED ORDER — TECHNETIUM TC 99M SESTAMIBI - CARDIOLITE
10.0000 | Freq: Once | INTRAVENOUS | Status: AC | PRN
  Administered 2013-07-31: 10:00:00 10 via INTRAVENOUS

## 2013-07-31 MED ORDER — SODIUM CHLORIDE 0.9 % IJ SOLN
INTRAMUSCULAR | Status: AC
  Administered 2013-07-31: 10 mL via INTRAVENOUS
  Filled 2013-07-31: qty 10

## 2013-07-31 MED ORDER — REGADENOSON 0.4 MG/5ML IV SOLN
INTRAVENOUS | Status: AC
  Administered 2013-07-31: 0.4 mg via INTRAVENOUS
  Filled 2013-07-31: qty 5

## 2013-07-31 NOTE — Progress Notes (Signed)
Stress Lab Nurses Notes - Sircharles Holzheimer 07/31/2013 Reason for doing test: Chest Pain Type of test: Marlane Hatcher Nurse performing test: Parke Poisson, RN Nuclear Medicine Tech: Lyndel Pleasure Echo Tech: Not Applicable MD performing test: Dr. Purvis Sheffield / Joni Reining NP Family MD: Lodema Hong Test explained and consent signed: yes IV started: 22g jelco, Saline lock flushed, No redness or edema and Saline lock started in radiology Symptoms: chest pain Treatment/Intervention: None Reason test stopped: protocol completed After recovery IV was: Discontinued via X-ray tech and No redness or edema Patient to return to Nuc. Med at : 12:30 Patient discharged: Home Patient's Condition upon discharge was: stable Comments: During test BP 151/58 & HR 78.  Recovery BP 129/55 & HR 64.  Symptoms resolved in recovery. Erskine Speed T

## 2013-08-07 ENCOUNTER — Ambulatory Visit (INDEPENDENT_AMBULATORY_CARE_PROVIDER_SITE_OTHER): Payer: Medicare Other | Admitting: Adult Health

## 2013-08-07 ENCOUNTER — Encounter: Payer: Self-pay | Admitting: Adult Health

## 2013-08-07 VITALS — BP 133/62 | HR 77 | Ht 74.0 in | Wt 259.4 lb

## 2013-08-07 DIAGNOSIS — I129 Hypertensive chronic kidney disease with stage 1 through stage 4 chronic kidney disease, or unspecified chronic kidney disease: Secondary | ICD-10-CM

## 2013-08-07 DIAGNOSIS — N186 End stage renal disease: Secondary | ICD-10-CM

## 2013-08-07 DIAGNOSIS — I10 Essential (primary) hypertension: Secondary | ICD-10-CM

## 2013-08-07 DIAGNOSIS — I4891 Unspecified atrial fibrillation: Secondary | ICD-10-CM

## 2013-08-07 NOTE — Assessment & Plan Note (Signed)
He is waiting to be placed back on kidney transplant list. Further assessment of lung function is ongoing. We have given him copies of stress test and echocardiogram to have for his records to take to his transplant specialist at [facility].

## 2013-08-07 NOTE — Patient Instructions (Signed)
Your physician recommends that you schedule a follow-up appointment in: 6 months You will receive a reminder letter two months in advance reminding you to call and schedule your appointment. If you don't receive this letter, please contact our office.  Your physician recommends that you continue on your current medications as directed. Please refer to the Current Medication list given to you today.     

## 2013-08-07 NOTE — Progress Notes (Deleted)
Name: Albert Lewis    DOB: [birth year]  Age: 62 y.o.  MR#: [id number]       PCP:  Syliva Overman, MD      Insurance: Payor: MEDICARE / Plan: MEDICARE PART A AND B / Product Type: *No Product type* /   CC:    Chief Complaint  Patient presents with  . Atrial Fibrillation  . Hypertension    VS Filed Vitals:   08/07/13 1424  BP: 133/62  Pulse: 77  Height: 6\' 2"  (1.88 m)  Weight: 259 lb 6.4 oz (117.663 kg)    Weights Current Weight  08/07/13 259 lb 6.4 oz (117.663 kg)  07/18/13 258 lb (117.028 kg)  03/01/13 259 lb (117.482 kg)    Blood Pressure  BP Readings from Last 3 Encounters:  08/07/13 133/62  07/18/13 140/65  05/16/13 142/57     Admit date:  (Not on file) Last encounter with RMR:  03/01/2013   Allergy Celebrex and Penicillins  Current Outpatient Prescriptions  Medication Sig Dispense Refill  . albuterol (PROVENTIL HFA;VENTOLIN HFA) 108 (90 BASE) MCG/ACT inhaler Inhale 2 puffs into the lungs every 6 (six) hours as needed for wheezing.  1 Inhaler  2  . allopurinol (ZYLOPRIM) 100 MG tablet Take 100 mg by mouth daily.       Marland Kitchen aspirin EC 81 MG tablet Take 81 mg by mouth daily.      . B Complex-C-Folic Acid (RENAL MULTIVITAMIN FORMULA PO) Take 1 tablet by mouth daily.      . cinacalcet (SENSIPAR) 60 MG tablet Take 60 mg by mouth daily.      Marland Kitchen docusate sodium (COLACE) 100 MG capsule Take 200 mg by mouth 2 (two) times daily.      . fish oil-omega-3 fatty acids 1000 MG capsule Take 1 g by mouth 3 (three) times daily. SUPER OMEGA 3 FORMULA: EPA 300/DHA200      . gabapentin (NEURONTIN) 300 MG capsule Take 300 mg by mouth 2 (two) times daily.       Marland Kitchen gemfibrozil (LOPID) 600 MG tablet Take 600 mg by mouth 2 (two) times daily.      Marland Kitchen HYDROcodone-acetaminophen (NORCO/VICODIN) 5-325 MG per tablet Take 1 tablet by mouth 2 (two) times daily as needed for pain.      Marland Kitchen insulin glargine (LANTUS) 100 UNIT/ML injection Inject 22 Units into the skin at bedtime.       Marland Kitchen loratadine  (CLARITIN) 10 MG tablet Take 10 mg by mouth daily as needed. Allergies      . losartan (COZAAR) 100 MG tablet Take 100 mg by mouth daily.      . metoprolol tartrate (LOPRESSOR) 25 MG tablet Take 25 mg by mouth 2 (two) times daily.      . pantoprazole (PROTONIX) 40 MG tablet Take 40 mg by mouth daily.      . pentoxifylline (TRENTAL) 400 MG CR tablet Take 400 mg by mouth 3 (three) times daily.      . sevelamer (RENVELA) 800 MG tablet Take 4,800 mg by mouth 3 (three) times daily with meals. Takes 6 tablets (4800 mg) three times daily with meals and 3 tablets (2400 mg) with snack.       No current facility-administered medications for this visit.    Discontinued Meds:    Medications Discontinued During This Encounter  Medication Reason  . ketoprofen (ORUDIS) 75 MG capsule Error    Patient Active Problem List   Diagnosis Date Noted  . Sinusitis, maxillary, chronic 10/04/2012  .  URI (upper respiratory infection) 09/26/2012  . Knee pain, acute 08/13/2012  . GE (gastroenteritis) 02/16/2012  . Bronchitis, acute 02/16/2012  . Back pain with radiation 01/13/2012  . GERD (gastroesophageal reflux disease) 08/05/2011  . GOITER, UNSPECIFIED 03/04/2011  . GOUT 08/18/2009  . OBESITY 08/18/2009  . SLEEP APNEA, OBSTRUCTIVE 08/18/2009  . ATRIAL FIBRILLATION, PAROXYSMAL 08/18/2009  . DM type 2 causing ESRD 05/14/2008  . HYPERLIPIDEMIA 05/14/2008  . Anemia of chronic disease 05/14/2008  . HYPERTENSION 05/14/2008  . ESRD 05/14/2008  . SECONDARY HYPERPARATHYROIDISM 05/14/2008  . LOW BACK PAIN 05/14/2008    LABS    Component Value Date/Time   NA 133* 07/18/2013 1050   NA 137 05/16/2013 2250   NA 133* 02/14/2013 1644   K 4.1 07/18/2013 1050   K 4.0 05/16/2013 2250   K 3.6 02/14/2013 1644   CL 93* 07/18/2013 1050   CL 95* 05/16/2013 2250   CL 93* 02/14/2013 1644   CO2 28 07/18/2013 1050   CO2 28 05/16/2013 2250   CO2 28 02/14/2013 1644   GLUCOSE 229* 07/18/2013 1050   GLUCOSE 135* 05/16/2013 2250    GLUCOSE 97 02/14/2013 1644   BUN 23 07/18/2013 1050   BUN 31* 05/16/2013 2250   BUN 17 02/14/2013 1644   CREATININE 6.06* 07/18/2013 1050   CREATININE 8.00* 05/16/2013 2250   CREATININE 6.83* 02/14/2013 1644   CREATININE 8.74* 10/19/2012 1657   CREATININE 9.37* 07/06/2012 1111   CALCIUM 9.0 07/18/2013 1050   CALCIUM 9.9 05/16/2013 2250   CALCIUM 9.1 02/14/2013 1644   GFRNONAA 9* 07/18/2013 1050   GFRNONAA 6* 05/16/2013 2250   GFRNONAA 8* 02/14/2013 1644   GFRAA 10* 07/18/2013 1050   GFRAA 7* 05/16/2013 2250   GFRAA 9* 02/14/2013 1644   CMP     Component Value Date/Time   NA 133* 07/18/2013 1050   K 4.1 07/18/2013 1050   CL 93* 07/18/2013 1050   CO2 28 07/18/2013 1050   GLUCOSE 229* 07/18/2013 1050   BUN 23 07/18/2013 1050   CREATININE 6.06* 07/18/2013 1050   CREATININE 8.74* 10/19/2012 1657   CALCIUM 9.0 07/18/2013 1050   PROT 7.3 07/06/2012 1111   ALBUMIN 4.5 07/06/2012 1111   AST 16 07/06/2012 1111   ALT 17 07/06/2012 1111   ALKPHOS 78 07/06/2012 1111   BILITOT 0.4 07/06/2012 1111   GFRNONAA 9* 07/18/2013 1050   GFRAA 10* 07/18/2013 1050       Component Value Date/Time   WBC 6.0 07/18/2013 1050   WBC 5.5 05/16/2013 2250   WBC 4.9 02/14/2013 1644   HGB 12.0* 07/18/2013 1050   HGB 11.8* 05/16/2013 2250   HGB 12.9* 02/14/2013 1644   HCT 35.4* 07/18/2013 1050   HCT 35.4* 05/16/2013 2250   HCT 38.8* 02/14/2013 1644   MCV 94.9 07/18/2013 1050   MCV 95.4 05/16/2013 2250   MCV 92.4 02/14/2013 1644    Lipid Panel     Component Value Date/Time   CHOL 191 07/06/2012 1111   TRIG 295* 07/06/2012 1111   HDL 35* 07/06/2012 1111   CHOLHDL 5.5 07/06/2012 1111   VLDL 59* 07/06/2012 1111   LDLCALC 97 07/06/2012 1111   LDLCALC not calculated due to Triglyceride >400 12/30/2008 1131    ABG    Component Value Date/Time   PHART 7.252* 01/07/2010 0155   PCO2ART 46.4* 01/07/2010 0155   PO2ART 86.1 01/07/2010 0155   HCO3 19.7* 01/07/2010 0155   TCO2 21.2 01/07/2010 0155   ACIDBASEDEF 6.3* 01/07/2010 0155  O2SAT 94.4  01/07/2010 0155     Lab Results  Component Value Date   TSH 1.321 07/06/2012   BNP (last 3 results) No results found for this basename: PROBNP,  in the last 8760 hours Cardiac Panel (last 3 results) No results found for this basename: CKTOTAL, CKMB, TROPONINI, RELINDX,  in the last 72 hours  Iron/TIBC/Ferritin    Component Value Date/Time   IRON 64 04/19/2008 0655   TIBC 230 04/19/2008 0655   FERRITIN 291 04/19/2008 0647     EKG Orders placed during the hospital encounter of 07/18/13  . ED EKG  . ED EKG  . EKG     Prior Assessment and Plan Problem List as of 08/07/2013     Cardiovascular and Mediastinum   HYPERTENSION   Last Assessment & Plan   03/01/2013 Office Visit Written 03/01/2013  1:29 PM by Jodelle Gross, NP     Blood pressure is mildly elevated today. Review of transfer shows labile blood pressure. He admits to not taking his medications today prior to office visit. He states he also does not like to take his antihypertensives on the days of dialysis as he feels very dizzy and lightheaded if they are in his system during the procedure. He states he will take his blood pressure medicine when he arrives home today. I have advised him to take his blood pressure medicine before he comes the next visit so that we may ascertain whether his medications are working appropriately for him. He verbalizes understanding.    ATRIAL FIBRILLATION, PAROXYSMAL   Last Assessment & Plan   03/01/2013 Office Visit Written 03/01/2013  1:28 PM by Jodelle Gross, NP     Heart rate is well-controlled, he continues on metoprolol 25 mg twice a day. No changes in his medication regimen.      Respiratory   SLEEP APNEA, OBSTRUCTIVE   Bronchitis, acute   Last Assessment & Plan   10/19/2012 Office Visit Edited 10/19/2012  9:53 PM by Salley Scarlet, MD     Worsened symptoms. Obtain repeat CXR with change in exam. CBC, BMET. Based on findings will need IV antibiotics given with HD. Start  prednisone and albuterol inhaler, cough med  CXR- no infiltrate atelectasis and bronchitis changes    URI (upper respiratory infection)   Last Assessment & Plan   09/26/2012 Office Visit Written 09/26/2012  3:41 PM by Salley Scarlet, MD     Chest x-ray done which was normal as there was concern for possible community-acquired pneumonia based on exam. His oxygen sats are also lower than his typical. He was given Rocephin 500 mg in office today. He will complete course with Z-Pak. Continue Mucinex as needed    Sinusitis, maxillary, chronic   Last Assessment & Plan   10/04/2012 Office Visit Written 10/06/2012  4:55 PM by Kerri Perches, MD     2 week antibiotic course prescribed      Digestive   GERD (gastroesophageal reflux disease)   Last Assessment & Plan   03/01/2013 Office Visit Written 03/01/2013  1:28 PM by Jodelle Gross, NP     His complaints of chest burning and indigestion appear to be more related to GERD symptoms vs. cardiac etiology. He notices it most when he is having dialysis or when he bends over. Oxygen and rest to help it. He is due to see GI for EGD and colonoscopy beginning of next week. At this time we will not pursue any cardiac testing  until EGD and colonoscopy are completed to rule out gastritis, and other GI etiology for discomfort. If nothing is found we will proceed with a repeat stress test.    GE (gastroenteritis)   Last Assessment & Plan   02/16/2012 Office Visit Written 02/27/2012  7:03 AM by Kerri Perches, MD     Not clinically dehydrated. Pt educated about the illness and steps to take to reduce spread and risk of dehydration      Endocrine   DM type 2 causing ESRD   Last Assessment & Plan   10/04/2012 Office Visit Written 10/06/2012  3:32 PM by Kerri Perches, MD     Controlled and followed by endo    SECONDARY HYPERPARATHYROIDISM   GOITER, UNSPECIFIED   Last Assessment & Plan   08/05/2011 Office Visit Written 08/08/2011  3:21 PM by  Kerri Perches, MD     Pt still need [place] , this will be scheduled      Genitourinary   ESRD   Last Assessment & Plan   08/05/2011 Office Visit Written 08/08/2011  3:21 PM by Kerri Perches, MD     tolerates dialysis well since 2007      Other   HYPERLIPIDEMIA   Last Assessment & Plan   10/04/2012 Office Visit Written 10/06/2012  4:56 PM by Kerri Perches, MD     Uncontrolled Low fat diet discussed and encouraged, no med change    GOUT   OBESITY   Last Assessment & Plan   01/13/2012 Office Visit Written 01/17/2012  6:40 PM by Kerri Perches, MD     Unchanged, lifestyle change encouraged to facilitate weight loss    Anemia of chronic disease   LOW BACK PAIN   Last Assessment & Plan   02/16/2012 Office Visit Written 02/27/2012  7:04 AM by Kerri Perches, MD     Recently evaluated by pain specialist and should be getting an epidural once he is over the  Acute illness     Back pain with radiation   Last Assessment & Plan   10/04/2012 Office Visit Written 10/06/2012  3:32 PM by Kerri Perches, MD     Improved pain control through pain clinic    Knee pain, acute   Last Assessment & Plan   08/11/2012 Office Visit Written 08/13/2012 12:10 PM by Salley Scarlet, MD     Acute knee pain, no specific injury, no swelling or effusion, unable to take NSAIDS due to renal status, possible related to his back with antalgic gait, at this time will not obtain imaging as very acute, refilled hydrocodone, given instructions below.  If he does not improve in a few weeks xray and refer to ortho        Imaging: Dg Chest 2 View  07/18/2013   *RADIOLOGY REPORT*  Clinical Data: Chest pain  CHEST - 2 VIEW  Comparison: Apr 26, 2013.  Findings: Cardiomediastinal silhouette appears normal.  No acute pulmonary disease is noted.  Bony thorax is intact.  IMPRESSION: No acute cardiopulmonary abnormality seen.   Original Report Authenticated By: Lupita Raider.,  M.D.   Nm Myocar Single  W/spect W/wall Motion And Ef  07/31/2013   Ordering Physician: Tonny Bollman  Reading Physician: Prentice Docker  Clinical Data: The patient is a 62 year old man with a history of paroxysmal atrial fibrillation, hypertension, and hypercholesterolemia, with end-stage renal disease.  He has been having burning chest discomfort and is referred today for the evaluation  of ischemia.  NUCLEAR MEDICINE STRESS MYOVIEW STUDY WITH SPECT AND LEFT VENTRICULAR EJECTION FRACTION  Radionuclide Data: One-day rest/stress protocol performed with 10/30 mCi of Tc-21m Myoview.  Stress Data: The patient was stressed according to the Lexiscan protocol for 3 minutes and 6 seconds.  He achieved work level of one met.  The resting heart rate of 58 beats per minute rose to a maximal heart rate of 78 beats per minute.  This value represents 49% of the maximal, age predicted heart rate.  The resting blood pressure of 127/59 mmHg rose to a maximum blood pressure of 151/50 mmHg.  The stress test was stopped due to completion of the protocol.  EKG: The resting ECG showed sinus bradycardia at a rate of 57 beats per minute.  With stress there were no diagnostic ST changes nor any arrhythmias.  Scintigraphic Data: Tomographic views were obtained using the short axis, vertical long axis, and horizontal long axis planes.  With stress there is a large sized area of moderate to severely decreased intensity in the mid inferior wall as well as the inferoapical regions.  There is no redistribution seen on the resting images. The left ventricle was mildly dilated at stress and rest. This is most consistent with myocardial scar, with no evidence for ischemia.  Global left ventricular systolic function is mildly reduced, LVEF 48%.  The aforementioned walls are moderately hypokinetic.  IMPRESSION: Abnormal Lexiscan Myoview stress test.  Myocardial scar is noted in the aforementioned regions, with no evidence for ischemia.  Global left ventricular systolic  function is mildly reduced, LVEF 48%. Wall motion abnormalities as noted above.  Mild left ventricular dilatation with no evidence for TID.   Original Report Authenticated By: Prentice Docker

## 2013-08-07 NOTE — Assessment & Plan Note (Signed)
Excellent control of BP currently. He will remain on his medications as ordered.

## 2013-08-07 NOTE — Assessment & Plan Note (Signed)
Heart rate is well controlled and is now in NSR with lst degree AV block. He is without significant complaints with the exception of chest tightness after dialysis, which goes away with rest.  He is medically complaint. No changes to medications.

## 2013-08-07 NOTE — Progress Notes (Signed)
HPI: [name] is a 62 year old patient of Dr. Dietrich Lewis we are seeing for ongoing followup and treatment of paroxysmal atrial fibrillation, hypertension, hypercholesterolemia, with history of end-stage renal disease with dialysis on Monday Wednesday and Friday. The patient states he is on the transplant list [facility], He is  complaining of chest burning substernal on and off for about a month. Was ordered a stress Myoview for evaluation of ischemia. This was completed on 07/31/2013 demonstrating scar without ischemia. Patient's LVEF is 40%, global left ventricular systolic function was mildly reduced.   His renal transplant has been put on hold in order to improve his breathing status. He is now on albuterol inhalers. He has discomfort in his chest after 3 hours of dialysis. Goes away with rest.         Allergies  Allergen Reactions  . Celebrex [Celecoxib]   . Penicillins Other (See Comments)    Couldn't walk.     Current Outpatient Prescriptions  Medication Sig Dispense Refill  . albuterol (PROVENTIL HFA;VENTOLIN HFA) 108 (90 BASE) MCG/ACT inhaler Inhale 2 puffs into the lungs every 6 (six) hours as needed for wheezing.  1 Inhaler  2  . allopurinol (ZYLOPRIM) 100 MG tablet Take 100 mg by mouth daily.       Marland Kitchen aspirin EC 81 MG tablet Take 81 mg by mouth daily.      . B Complex-C-Folic Acid (RENAL MULTIVITAMIN FORMULA PO) Take 1 tablet by mouth daily.      . cinacalcet (SENSIPAR) 60 MG tablet Take 60 mg by mouth daily.      Marland Kitchen docusate sodium (COLACE) 100 MG capsule Take 200 mg by mouth 2 (two) times daily.      . fish oil-omega-3 fatty acids 1000 MG capsule Take 1 g by mouth 3 (three) times daily. SUPER OMEGA 3 FORMULA: EPA 300/DHA200      . gabapentin (NEURONTIN) 300 MG capsule Take 300 mg by mouth 2 (two) times daily.       Marland Kitchen gemfibrozil (LOPID) 600 MG tablet Take 600 mg by mouth 2 (two) times daily.      Marland Kitchen HYDROcodone-acetaminophen (NORCO/VICODIN) 5-325 MG per tablet Take 1 tablet by mouth 2  (two) times daily as needed for pain.      Marland Kitchen insulin glargine (LANTUS) 100 UNIT/ML injection Inject 22 Units into the skin at bedtime.       Marland Kitchen loratadine (CLARITIN) 10 MG tablet Take 10 mg by mouth daily as needed. Allergies      . losartan (COZAAR) 100 MG tablet Take 100 mg by mouth daily.      . metoprolol tartrate (LOPRESSOR) 25 MG tablet Take 25 mg by mouth 2 (two) times daily.      . pantoprazole (PROTONIX) 40 MG tablet Take 40 mg by mouth daily.      . pentoxifylline (TRENTAL) 400 MG CR tablet Take 400 mg by mouth 3 (three) times daily.      . sevelamer (RENVELA) 800 MG tablet Take 4,800 mg by mouth 3 (three) times daily with meals. Takes 6 tablets (4800 mg) three times daily with meals and 3 tablets (2400 mg) with snack.       No current facility-administered medications for this visit.    Past Medical History  Diagnosis Date  . Paroxysmal atrial fibrillation 2/09    Abnormal stress nuclear study in 01/2008; normal coronary angiography  . Hyperlipidemia 2008  . Hypertension 1993    Mild to moderate LVH  . Diabetes mellitus, type  2 1993    no insulin  . ESRD (end stage renal disease) on dialysis     Dialysis since 2007  . Obesity     sleep apnea treated with BiPAP  . Anemia   . Gastroesophageal reflux disease   . Hyperparathyroidism     Secondary  . Allergic rhinitis   . Lower gastrointestinal bleed 04/2008    diverticulosis; hemorrhoids; constipation  . Diverticular disease   . Chronic back pain     Degenerative disc disease  . Renal insufficiency     Past Surgical History  Procedure Laterality Date  . Cholecystectomy  1999  . Exploratory laparotomy      shunt complications  . Cataract extraction w/ intraocular lens  implant, bilateral  2008  . Lumbar peritoneal shunt      ROS: Review of systems complete and found to be negative unless listed above  PHYSICAL EXAM BP 133/62  Pulse 77  Ht 6\' 2"  (1.88 m)  Wt 259 lb 6.4 oz (117.663 kg)  BMI 33.29 kg/m2 General:  Well developed, well nourished, in no acute distress Head: Eyes PERRLA, No xanthomas.   Normal cephalic and atramatic  Lungs: Clear bilaterally to auscultation and percussion. Heart: HRRR S1 S2, systolic murmur is noted at the apex. Pulses are 2+ & equal.            No carotid bruit. No JVD. Dialysis shunt is palpated in the left upper arm. Good thrill Abdomen: Bowel sounds are positive, abdomen soft and non-tender without masses or                  Hernia's noted. Msk:  Back normal, norma but slowl gait. Diminished strength and tone for age. Extremities: No clubbing, cyanosis or edema.  DP +1 Neuro: Alert and oriented X 3. Psych:  Good affect, responds appropriately  EKG:NSR rate of 76 bpm. 1st degree AV block.  ASSESSMENT AND PLAN

## 2013-08-08 ENCOUNTER — Encounter: Payer: Self-pay | Admitting: Adult Health

## 2013-08-28 ENCOUNTER — Encounter: Payer: Medicare Other | Admitting: Family Medicine

## 2013-09-21 ENCOUNTER — Emergency Department (HOSPITAL_COMMUNITY): Payer: Medicare Other

## 2013-09-21 ENCOUNTER — Encounter (HOSPITAL_COMMUNITY): Payer: Self-pay | Admitting: *Deleted

## 2013-09-21 ENCOUNTER — Emergency Department (HOSPITAL_COMMUNITY)
Admission: EM | Admit: 2013-09-21 | Discharge: 2013-09-21 | Disposition: A | Discharge status: Home / Self Care | Payer: Medicare Other | Attending: Emergency Medicine | Admitting: Emergency Medicine

## 2013-09-21 DIAGNOSIS — Z7982 Long term (current) use of aspirin: Secondary | ICD-10-CM | POA: Insufficient documentation

## 2013-09-21 DIAGNOSIS — M25519 Pain in unspecified shoulder: Secondary | ICD-10-CM | POA: Insufficient documentation

## 2013-09-21 DIAGNOSIS — Z87891 Personal history of nicotine dependence: Secondary | ICD-10-CM | POA: Insufficient documentation

## 2013-09-21 DIAGNOSIS — G8929 Other chronic pain: Secondary | ICD-10-CM | POA: Insufficient documentation

## 2013-09-21 DIAGNOSIS — Z79899 Other long term (current) drug therapy: Secondary | ICD-10-CM | POA: Insufficient documentation

## 2013-09-21 DIAGNOSIS — Z8709 Personal history of other diseases of the respiratory system: Secondary | ICD-10-CM | POA: Insufficient documentation

## 2013-09-21 DIAGNOSIS — K219 Gastro-esophageal reflux disease without esophagitis: Secondary | ICD-10-CM | POA: Insufficient documentation

## 2013-09-21 DIAGNOSIS — M25512 Pain in left shoulder: Secondary | ICD-10-CM

## 2013-09-21 DIAGNOSIS — I4891 Unspecified atrial fibrillation: Secondary | ICD-10-CM | POA: Insufficient documentation

## 2013-09-21 DIAGNOSIS — Z8639 Personal history of other endocrine, nutritional and metabolic disease: Secondary | ICD-10-CM | POA: Insufficient documentation

## 2013-09-21 DIAGNOSIS — N186 End stage renal disease: Secondary | ICD-10-CM | POA: Insufficient documentation

## 2013-09-21 DIAGNOSIS — E669 Obesity, unspecified: Secondary | ICD-10-CM | POA: Insufficient documentation

## 2013-09-21 DIAGNOSIS — I120 Hypertensive chronic kidney disease with stage 5 chronic kidney disease or end stage renal disease: Secondary | ICD-10-CM | POA: Insufficient documentation

## 2013-09-21 DIAGNOSIS — E119 Type 2 diabetes mellitus without complications: Secondary | ICD-10-CM | POA: Insufficient documentation

## 2013-09-21 DIAGNOSIS — D649 Anemia, unspecified: Secondary | ICD-10-CM | POA: Insufficient documentation

## 2013-09-21 DIAGNOSIS — Z862 Personal history of diseases of the blood and blood-forming organs and certain disorders involving the immune mechanism: Secondary | ICD-10-CM | POA: Insufficient documentation

## 2013-09-21 DIAGNOSIS — Z880 Allergy status to penicillin: Secondary | ICD-10-CM | POA: Insufficient documentation

## 2013-09-21 DIAGNOSIS — Z992 Dependence on renal dialysis: Secondary | ICD-10-CM | POA: Insufficient documentation

## 2013-09-21 NOTE — ED Provider Notes (Signed)
CSN: [id number]     Arrival date & time 09/21/13  1759 History   First MD Initiated Contact with Patient 09/21/13 1810     This chart was scribed for Donnetta Hutching, MD by Manuela Schwartz, ED scribe. This patient was seen in room APA15/APA15 and the patient's care was started at 1810.  Chief Complaint  Patient presents with  . Shoulder Pain   The history is provided by the patient. No language interpreter was used.   HPI Comments: Albert Lewis is a 62 y.o. male who presents to the Emergency Department complaining of sudden onset, constant in duration left shoulder/upper arm pain which began when he reached for his wallet in his back left pocket and felt a popping sensation in his left shoulder followed by sudden pain while he was going to get dialysis this AM. He states pain radiates down his left upper arm.   Past Medical History  Diagnosis Date  . Paroxysmal atrial fibrillation 2/09    Abnormal stress nuclear study in 01/2008; normal coronary angiography  . Hyperlipidemia 2008  . Hypertension 1993    Mild to moderate LVH  . Diabetes mellitus, type 2 1993    no insulin  . ESRD (end stage renal disease) on dialysis     Dialysis since 2007  . Obesity     sleep apnea treated with BiPAP  . Anemia   . Gastroesophageal reflux disease   . Hyperparathyroidism     Secondary  . Allergic rhinitis   . Lower gastrointestinal bleed 04/2008    diverticulosis; hemorrhoids; constipation  . Diverticular disease   . Chronic back pain     Degenerative disc disease  . Renal insufficiency    Past Surgical History  Procedure Laterality Date  . Cholecystectomy  1999  . Cataract extraction w/ intraocular lens  implant, bilateral  2008  . Lumbar peritoneal shunt    . Exploratory laparotomy      shunt complications  . Back surgery     Family History  Problem Relation Age of Onset  . Cancer Mother   . Hypertension Mother   . Hypertension Sister 49  . Hypertension Daughter 30  . Diabetes Daughter     History  Substance Use Topics  . Smoking status: Former Smoker    Types: Cigarettes  . Smokeless tobacco: Not on file  . Alcohol Use: No    Review of Systems  Constitutional: Negative for fever and chills.  HENT: Negative for congestion.   Respiratory: Negative for shortness of breath.   Cardiovascular: Negative for chest pain.  Gastrointestinal: Negative for nausea, vomiting and abdominal pain.  Musculoskeletal:       Moderate left shoulder pain.  Neurological: Negative for weakness.  A complete 10 system review of systems was obtained and all systems are negative except as noted in the HPI and PMH.    Allergies  Celebrex and Penicillins  Home Medications   Current Outpatient Rx  Name  Route  Sig  Dispense  Refill  . albuterol (PROVENTIL HFA;VENTOLIN HFA) 108 (90 BASE) MCG/ACT inhaler   Inhalation   Inhale 2 puffs into the lungs every 6 (six) hours as needed for wheezing.   1 Inhaler   2   . allopurinol (ZYLOPRIM) 100 MG tablet   Oral   Take 100 mg by mouth daily.          Marland Kitchen aspirin EC 81 MG tablet   Oral   Take 81 mg by mouth daily.         Marland Kitchen  B Complex-C-Folic Acid (RENAL MULTIVITAMIN FORMULA PO)   Oral   Take 1 tablet by mouth daily.         . cinacalcet (SENSIPAR) 60 MG tablet   Oral   Take 60 mg by mouth daily.         Marland Kitchen docusate sodium (COLACE) 100 MG capsule   Oral   Take 200 mg by mouth 2 (two) times daily.         . fish oil-omega-3 fatty acids 1000 MG capsule   Oral   Take 1 g by mouth 3 (three) times daily. SUPER OMEGA 3 FORMULA: EPA 300/DHA200         . gabapentin (NEURONTIN) 300 MG capsule   Oral   Take 300 mg by mouth 2 (two) times daily.          Marland Kitchen gemfibrozil (LOPID) 600 MG tablet   Oral   Take 600 mg by mouth 2 (two) times daily.         Marland Kitchen HYDROcodone-acetaminophen (NORCO/VICODIN) 5-325 MG per tablet   Oral   Take 1 tablet by mouth 2 (two) times daily as needed for pain.         Marland Kitchen insulin glargine (LANTUS) 100  UNIT/ML injection   Subcutaneous   Inject 22 Units into the skin at bedtime.          Marland Kitchen loratadine (CLARITIN) 10 MG tablet   Oral   Take 10 mg by mouth daily as needed. Allergies         . losartan (COZAAR) 100 MG tablet   Oral   Take 100 mg by mouth daily.         . metoprolol tartrate (LOPRESSOR) 25 MG tablet   Oral   Take 25 mg by mouth 2 (two) times daily.         . pantoprazole (PROTONIX) 40 MG tablet   Oral   Take 40 mg by mouth daily.         . pentoxifylline (TRENTAL) 400 MG CR tablet   Oral   Take 400 mg by mouth 3 (three) times daily.         . sevelamer (RENVELA) 800 MG tablet   Oral   Take 4,800 mg by mouth 3 (three) times daily with meals. Takes 6 tablets (4800 mg) three times daily with meals and 3 tablets (2400 mg) with snack.          Triage Vitals: Ht 6\' 2"  (1.88 m)  Wt 250 lb (113.399 kg)  BMI 32.08 kg/m2 Physical Exam  Nursing note and vitals reviewed. Constitutional: He is oriented to person, place, and time. He appears well-developed and well-nourished.  Awake, alert, nontoxic appearance with baseline speech for patient.  HENT:  Head: Normocephalic and atraumatic.  Mouth/Throat: No oropharyngeal exudate.  Eyes: Conjunctivae and EOM are normal. Pupils are equal, round, and reactive to light. Right eye exhibits no discharge. Left eye exhibits no discharge.  Neck: Normal range of motion. Neck supple.  Cardiovascular: Normal rate, regular rhythm and normal heart sounds.   No murmur heard. Pulmonary/Chest: Effort normal and breath sounds normal. No stridor. No respiratory distress. He has no wheezes. He has no rales. He exhibits no tenderness.  Abdominal: Soft. Bowel sounds are normal. He exhibits no mass. There is no tenderness. There is no rebound.  Musculoskeletal: Normal range of motion. He exhibits tenderness.   Moderate left shoulder/deltoid tenderness to palpation, especially posteriorallly   Lymphadenopathy:    He has no  cervical  adenopathy.  Neurological: He is alert and oriented to person, place, and time.  Skin: Skin is warm and dry. No rash noted.  Psychiatric: He has a normal mood and affect.    ED Course  Procedures (including critical care time) DIAGNOSTIC STUDIES:   COORDINATION OF CARE: At 625 PM Discussed treatment plan with patient which includes left shoulder X-ray. Patient agrees.   Labs Review Labs Reviewed - No data to display Imaging Review No results found. Dg Shoulder Left  09/21/2013   CLINICAL DATA:  Left shoulder pain  EXAM: LEFT SHOULDER - 2+ VIEW  COMPARISON:  None.  FINDINGS: There is no evidence of fracture or dislocation. There is no evidence of arthropathy or other focal bone abnormality. Soft tissues are unremarkable.  IMPRESSION: No acute abnormality noted.   Electronically Signed   By: Alcide Clever M.D.   On: 09/21/2013 19:06   MDM  No diagnosis found.  Patient is minimal tenderness surrounding the left shoulder. Slight pain with range of motion. X-ray shows no bony fracture.  Could be tendinitis or muscle strain     I personally performed the services described in this documentation, which was scribed in my presence. The recorded information has been reviewed and is accurate.      Donnetta Hutching, MD 09/21/13 [phone]

## 2013-09-21 NOTE — ED Notes (Signed)
Pain lt shoulder, onset this am when reached back for his wallet. Pain with movement.  Dialysis pt

## 2013-10-18 ENCOUNTER — Telehealth: Payer: Self-pay

## 2013-10-18 MED ORDER — DOXYCYCLINE HYCLATE 100 MG PO TABS: 100.0000 mg | ORAL_TABLET | Freq: Two times a day (BID) | ORAL | Status: DC

## 2013-10-18 NOTE — Telephone Encounter (Signed)
Patient has appt to come in Tues-  since Monday, he has been having sinus congestion with pressure and green mucus. Pain around eyes and cheeks and chills and body aches. No other symptoms. Wanted something called in or to be seen tomorrow. Already 9 on schedule. [phone]

## 2013-10-18 NOTE — Telephone Encounter (Signed)
Patient aware and med sent  

## 2013-10-18 NOTE — Telephone Encounter (Signed)
pls send in and let him know doxycycline 100mg  twice daily for 10 days, and pls remind him of the need to keep the appt next week, has not been in for over 1 year!

## 2013-10-23 ENCOUNTER — Encounter: Payer: Medicare Other | Admitting: Family Medicine

## 2013-10-29 ENCOUNTER — Telehealth: Payer: Self-pay

## 2013-10-30 ENCOUNTER — Ambulatory Visit (INDEPENDENT_AMBULATORY_CARE_PROVIDER_SITE_OTHER): Payer: Medicare Other | Admitting: Family Medicine

## 2013-10-30 ENCOUNTER — Encounter: Payer: Self-pay | Admitting: Family Medicine

## 2013-10-30 ENCOUNTER — Encounter (INDEPENDENT_AMBULATORY_CARE_PROVIDER_SITE_OTHER): Payer: Self-pay

## 2013-10-30 VITALS — BP 166/72 | HR 97 | Resp 18 | Ht 74.0 in | Wt 258.0 lb

## 2013-10-30 DIAGNOSIS — E785 Hyperlipidemia, unspecified: Secondary | ICD-10-CM

## 2013-10-30 DIAGNOSIS — N186 End stage renal disease: Secondary | ICD-10-CM

## 2013-10-30 DIAGNOSIS — E1122 Type 2 diabetes mellitus with diabetic chronic kidney disease: Secondary | ICD-10-CM

## 2013-10-30 DIAGNOSIS — E1129 Type 2 diabetes mellitus with other diabetic kidney complication: Secondary | ICD-10-CM

## 2013-10-30 DIAGNOSIS — I10 Essential (primary) hypertension: Secondary | ICD-10-CM

## 2013-10-30 DIAGNOSIS — J209 Acute bronchitis, unspecified: Secondary | ICD-10-CM

## 2013-10-30 DIAGNOSIS — I4891 Unspecified atrial fibrillation: Secondary | ICD-10-CM

## 2013-10-30 MED ORDER — IPRATROPIUM BROMIDE 0.02 % IN SOLN: 0.5000 mg | Freq: Four times a day (QID) | RESPIRATORY_TRACT | Status: AC

## 2013-10-30 MED ORDER — ALBUTEROL SULFATE (2.5 MG/3ML) 0.083% IN NEBU: 2.5000 mg | INHALATION_SOLUTION | Freq: Four times a day (QID) | RESPIRATORY_TRACT | Status: AC | PRN

## 2013-10-30 NOTE — Progress Notes (Signed)
  Subjective:    Patient ID: Albert Lewis, male    DOB: 05/10/1951, 62 y.o.   MRN: [id number]  HPI Hospitalized at [facility] on 11/3 to 4 with chest pain, and again n 11/7 to 11/ 8 with afib with rapid ventricular response. In for follow up. Last seen in this office for over 1 year.Pt though prescribed comadin , has not filled script due to lack of funds , [facility] [place] [facility], and also locally with nephrology as he is on dialysis Presnted also with acute bronchitis , currently still on antibiotics, and has neb solution and neb machinme recommended at d/c for use for this acute episode  Needs neb solution for acute bronchitis, through [facility]     New is a walker  From [place] due to diabetic neuropathy    Review of Systems See HPI  Denies sinus pressure, nasal congestion, ear pain or sore throat. .  Denies abdominal pain, nausea, vomiting,diarrhea or constipation.   Chronic back pain with limitation in mobility noiw to the extent that he ambulates with a walker due to c/o diabetic neuropathy. Denies headaches or  seizures,  Denies depression, anxiety or insomnia. Denies skin break down or rash.        Objective:   Physical Exam  Patient alert and oriented and in no cardiopulmonary distress.  HEENT: No facial asymmetry, EOMI, no sinus tenderness,  oropharynx pink and moist.  Neck decreased though adequate ROM,no adenopathy.  Chest: decreased though adewquate air entry, scattered crackles and few wheezes bilaterally  CVS: S1, S2 no murmurs, no S3.  ABD: Soft non tender.   Ext: No edema  MS: decreased  ROM spine, shoulders, hips and knees.  Skin: Intact, no ulcerations or rash noted.  Psych: Good eye contact, normal affect. Memory intact not anxious or depressed appearing.  CNS: CN 2-12 intact.     Assessment & Plan:

## 2013-10-30 NOTE — Telephone Encounter (Signed)
This will be addressed at [facility] 10/30/2013.   Faxed [facility] for discharge summary.

## 2013-10-30 NOTE — Patient Instructions (Signed)
Annual wellness in mid January, Tuesday or Thursday  We will fax EKG report to cardiology office in [place].  Script for neb solution for 1 month only will [facility]

## 2013-11-04 NOTE — Assessment & Plan Note (Signed)
Recurrent a fib requiring hospitalization. Needs cardiology f/u , needs to obtain coumadin and take med as prescribed to lower stroke risk

## 2013-11-04 NOTE — Assessment & Plan Note (Signed)
Uncontrolled Med adjustments through the nephrologist who dialyses him

## 2013-11-04 NOTE — Assessment & Plan Note (Signed)
Pt to complete antibiotic course d/c home on, also neb treatments for at most 1 month

## 2013-11-04 NOTE — Assessment & Plan Note (Signed)
Hyperlipidemia:Low fat diet discussed and encouraged.  Will attempt to retrieve updated profile either from the [place] or recent hospitalization if available

## 2013-11-04 NOTE — Assessment & Plan Note (Signed)
[facility], and [facility] 3 times weekly

## 2013-11-09 ENCOUNTER — Encounter: Payer: Self-pay | Admitting: Adult Health

## 2013-11-09 ENCOUNTER — Ambulatory Visit (INDEPENDENT_AMBULATORY_CARE_PROVIDER_SITE_OTHER): Payer: Medicare Other | Admitting: Adult Health

## 2013-11-09 VITALS — BP 175/80 | HR 78 | Ht 74.0 in | Wt 255.0 lb

## 2013-11-09 DIAGNOSIS — N186 End stage renal disease: Secondary | ICD-10-CM

## 2013-11-09 DIAGNOSIS — I10 Essential (primary) hypertension: Secondary | ICD-10-CM

## 2013-11-09 DIAGNOSIS — I4891 Unspecified atrial fibrillation: Secondary | ICD-10-CM

## 2013-11-09 NOTE — Assessment & Plan Note (Signed)
His BP is elevated today, but he has not taken his medications today due to dialysis. He will take them when he gets home.

## 2013-11-09 NOTE — Assessment & Plan Note (Signed)
He has not had any rapid HR since being dishcarged from the hospital. He has not taken diltiazem as he is unable to afford this. I have advised that he can take an extra dose of metoprolol 25 mg if necessary for rapid HR and not take the dilitazem unless this continued. He is to follow up in [place] with Dr. Purvis Sheffield in 6 months.

## 2013-11-09 NOTE — Progress Notes (Signed)
HPI: [name] is a 62 year old former patient of Dr. Dietrich Pates we are following for ongoing assessment and management of atrial fibrillation, hypertension, hypercholesterolemia, with history of end-stage renal disease with dialysis. Patient was seen at [facility] ER with complaints of palpitations on 10/26/2013. He was admitted and found to have atrial fib with RVR now rate controlled. He had been started on diltiazem 60 mg by mouth 4 times a day. He was started on Coumadin with INR checks by primary care physician.    He is without cardiac complaints. He has had no palpitations or heart racing since discharge. He did not get the dilitazem Rx. HR remains controlled.  Allergies  Allergen Reactions  . Celebrex [Celecoxib]   . Penicillins Other (See Comments)    Couldn't walk.     Current Outpatient Prescriptions  Medication Sig Dispense Refill  . albuterol (PROVENTIL HFA;VENTOLIN HFA) 108 (90 BASE) MCG/ACT inhaler Inhale 2 puffs into the lungs every 6 (six) hours as needed for wheezing.  1 Inhaler  2  . albuterol (PROVENTIL) (2.5 MG/3ML) 0.083% nebulizer solution Take 3 mLs (2.5 mg total) by nebulization every 6 (six) hours as needed for wheezing or shortness of breath.  75 mL  0  . allopurinol (ZYLOPRIM) 100 MG tablet Take 100 mg by mouth daily.       Marland Kitchen aspirin EC 81 MG tablet Take 81 mg by mouth daily.      . B Complex-C-Folic Acid (RENAL MULTIVITAMIN FORMULA PO) Take 1 tablet by mouth daily.      Marland Kitchen docusate sodium (COLACE) 100 MG capsule Take 200 mg by mouth 2 (two) times daily.      Marland Kitchen doxycycline (VIBRA-TABS) 100 MG tablet Take 100 mg by mouth 2 (two) times daily.      . fish oil-omega-3 fatty acids 1000 MG capsule Take 1 g by mouth 3 (three) times daily. SUPER OMEGA 3 FORMULA: EPA 300/DHA200      . gabapentin (NEURONTIN) 300 MG capsule Take 300 mg by mouth 2 (two) times daily.       Marland Kitchen gemfibrozil (LOPID) 600 MG tablet Take 600 mg by mouth 2 (two) times daily.      .  insulin glargine (LANTUS) 100 UNIT/ML injection Inject 22 Units into the skin at bedtime.       Marland Kitchen ipratropium (ATROVENT) 0.02 % nebulizer solution Take 2.5 mLs (0.5 mg total) by nebulization 4 (four) times daily.  75 mL  0  . losartan (COZAAR) 100 MG tablet Take 100 mg by mouth daily.      . metoprolol tartrate (LOPRESSOR) 25 MG tablet Take 25 mg by mouth 2 (two) times daily.      . sevelamer (RENVELA) 800 MG tablet Take 4,800 mg by mouth 3 (three) times daily with meals. Takes 6 tablets (4800 mg) three times daily with meals and 3 tablets (2400 mg) with snack.       No current facility-administered medications for this visit.    Past Medical History  Diagnosis Date  . Paroxysmal atrial fibrillation 2/09    Abnormal stress nuclear study in 01/2008; normal coronary angiography  . Hyperlipidemia 2008  . Hypertension 1993    Mild to moderate LVH  . Diabetes mellitus, type 2 1993    no insulin  . ESRD (end stage renal disease) on dialysis     Dialysis since 2007  . Obesity     sleep apnea treated with BiPAP  . Anemia   . Gastroesophageal reflux  disease   . Hyperparathyroidism     Secondary  . Allergic rhinitis   . Lower gastrointestinal bleed 04/2008    diverticulosis; hemorrhoids; constipation  . Diverticular disease   . Chronic back pain     Degenerative disc disease  . Renal insufficiency     Past Surgical History  Procedure Laterality Date  . Cholecystectomy  1999  . Cataract extraction w/ intraocular lens  implant, bilateral  2008  . Lumbar peritoneal shunt    . Exploratory laparotomy      shunt complications  . Back surgery      ROS:.NROPS  PHYSICAL EXAM BP 175/80  Pulse 78  Ht 6\' 2"  (1.88 m)  Wt 255 lb (115.667 kg)  BMI 32.73 kg/m2 General: Well developed, well nourished, in no acute distress Head: Eyes PERRLA, No xanthomas.Some drainage from the left eye.   Normal cephalic and atramatic  Lungs: Clear bilaterally to auscultation and percussion. Heart: HRRR S1  S2, without MRG.  Pulses are 2+ & equal.            No carotid bruit. No JVD.  No abdominal bruits. No femoral bruits. Abdomen: Bowel sounds are positive, abdomen soft and non-tender without masses or                  Hernia's noted. Msk:  Back normal, normal gait. Normal strength and tone for age. Extremities: No clubbing, cyanosis or edema. AV fistula in the right upper arm. DP +1 Neuro: Alert and oriented X 3. Psych:  Good affect, responds appropriately    ASSESSMENT AND PLAN

## 2013-11-09 NOTE — Assessment & Plan Note (Signed)
He will continue his usual schedule. Follow-up labs per nephrology.

## 2013-11-09 NOTE — Patient Instructions (Signed)
Your physician wants you to follow-up in: 6 months  You will receive a reminder letter in the mail two months in advance. If you don't receive a letter, please call our office to schedule the follow-up appointment.  Your physician recommends that you continue on your current medications as directed. Please refer to the Current Medication list given to you today.  

## 2014-01-29 ENCOUNTER — Encounter (INDEPENDENT_AMBULATORY_CARE_PROVIDER_SITE_OTHER): Payer: Self-pay

## 2014-01-29 ENCOUNTER — Ambulatory Visit (INDEPENDENT_AMBULATORY_CARE_PROVIDER_SITE_OTHER): Payer: Medicare Other | Admitting: Family Medicine

## 2014-01-29 ENCOUNTER — Encounter: Payer: Self-pay | Admitting: Family Medicine

## 2014-01-29 VITALS — BP 140/68 | HR 72 | Resp 16 | Ht 74.0 in | Wt 252.4 lb

## 2014-01-29 DIAGNOSIS — Z0000 Encounter for general adult medical examination without abnormal findings: Secondary | ICD-10-CM | POA: Insufficient documentation

## 2014-01-29 DIAGNOSIS — N186 End stage renal disease: Secondary | ICD-10-CM

## 2014-01-29 DIAGNOSIS — E1122 Type 2 diabetes mellitus with diabetic chronic kidney disease: Secondary | ICD-10-CM

## 2014-01-29 NOTE — Assessment & Plan Note (Signed)
Annual exam as documented. Counseling done  re healthy lifestyle involving commitment to 150 minutes exercise per week, heart healthy diet, and attaining healthy weight.The importance of adequate sleep also discussed. Regular seat belt use and safe storage  of firearms if patient has them, is also discussed. Changes in health habits are decided on by the patient with goals and time frames  set for achieving them. Immunization and cancer screening needs are specifically addressed at this visit.  

## 2014-01-29 NOTE — Patient Instructions (Addendum)
F/u in 6 month, call if you need me before please.  Please increase exer cise as we discussed  Please try to get the shingles vaccine at the [place]  Please see if you can get help at home, esp on the days you have dialysis, due to weakness and falls  Please discuss increased weakness in hands and dropping things at the [place]

## 2014-01-29 NOTE — Progress Notes (Signed)
Subjective:    Patient ID: Albert Lewis, male    DOB: 1951/10/08, 63 y.o.   MRN: [id number]  HPI Preventive Screening-Counseling & Management   Patient present here today for a Medicare annual wellness visit.   Current Problems (verified)   Medications Prior to Visit Allergies (verified)   PAST HISTORY  Family History  Social History  Divorced in 2010, was married  For 22 years. Two childen. No current drug use   Risk Factors  Current exercise habits:  3 days per week for 30 mins, needs to be increased and he intends to work on this  Dietary issues discussed:heart healthy diet, low in protein, reduced carb diet   Cardiac risk factors: diabetic with possible PAD will discuss further at the [place] this month  Depression Screen  (Note: if answer to either of the following is "Yes", a more complete depression screening is indicated)   Over the past two weeks, have you felt down, depressed or hopeless? No  Over the past two weeks, have you felt little interest or pleasure in doing things? No  Have you lost interest or pleasure in daily life? No  Do you often feel hopeless? No  Do you cry easily over simple problems? No   Activities of Daily Living  In your present state of health, do you have any difficulty performing the following activities?  Driving?: No Managing money?: No Feeding yourself?:No Getting from bed to chair?:yes reduced mobility Climbing a flight of stairs?:yes Preparing food and eating?:yes, unable to stand over 10 mins, and reports increased numbness and weakness in hands , dropping things, has been trying to et in home assistance for a few hours on the days  He has dialysis, will need to get this through the [place] if possible as he does not have medicaid and will not qualify Bathing or showering?:No Getting dressed?:No Getting to the toilet?:No Using the toilet?:No Moving around from place to place?: yes , fell about 3 times in the past year after  dialysis, feels weak, has a walker and has been told one leg is longer  Fall Risk Assessment In the past year have you fallen or had a near fall?:yes 3 , following dialysis Are you currently taking any medications that make you dizzy?:No, but post dialysis at times he feels light headed   Hearing Difficulties: No Do you often ask people to speak up or repeat themselves?:No Do you experience ringing or noises in your ears?:No Do you have difficulty understanding soft or whispered voices?:No  Cognitive Testing  Alert? Yes Normal Appearance?Yes  Oriented to person? Yes Place? Yes  Time? Yes  Displays appropriate judgment?Yes  Can read the correct time from a watch face? yes Are you having problems remembering things?No  Advanced Directives have been discussed with the patient?Yes , full code   List the Names of Other Physician/Practitioners you currently JHE:RDEYCXK medical care is through the  [place], dialysis is through local nephrologist  Indicate any recent Medical Services you may have received from other than [facility] providers in the past year (date may be approximate).   Assessment:    Annual Wellness Exam   Plan:    During the course of the visit the patient was educated and counseled about appropriate screening and preventive services including:  A healthy diet is rich in fruit, vegetables and whole grains. Poultry fish, nuts and beans are a healthy choice for protein rather then red meat. A low sodium diet and drinking 64 ounces  of water daily is generally recommended. Oils and sweet should be limited. Carbohydrates especially for those who are diabetic or overweight, should be limited to 30-45 gram per meal. It is important to eat on a regular schedule, at least 3 times daily. Snacks should be primarily fruits, vegetables or nuts. It is important that you exercise regularly at least 30 minutes 5 times a week. If you develop chest pain, have severe difficulty breathing, or feel  very tired, stop exercising immediately and seek medical attention  Immunization reviewed and updated. Cancer screening reviewed and updated    Patient Instructions (the written plan) was given to the patient.  Medicare Attestation  I have personally reviewed:  The patient's medical and social history  Their use of alcohol, tobacco or illicit drugs  Their current medications and supplements  The patient's functional ability including ADLs,fall risks, home safety risks, cognitive, and hearing and visual impairment  Diet and physical activities  Evidence for depression or mood disorders  The patient's weight, height, BMI, and visual acuity have been recorded in the chart. I have made referrals, counseling, and provided education to the patient based on review of the above and I have provided the patient with a written personalized care plan for preventive services.      Review of Systems     Objective:   Physical Exam        Assessment & Plan:  Routine general medical examination at a health care facility Annual exam as documented. Counseling done  re healthy lifestyle involving commitment to 150 minutes exercise per week, heart healthy diet, and attaining healthy weight.The importance of adequate sleep also discussed. Regular seat belt use and safe storage  of firearms if patient has them, is also discussed. Changes in health habits are decided on by the patient with goals and time frames  set for achieving them. Immunization and cancer screening needs are specifically addressed at this visit.

## 2014-02-05 ENCOUNTER — Telehealth: Payer: Self-pay | Admitting: Family Medicine

## 2014-02-05 NOTE — Telephone Encounter (Signed)
Pls contact pt and request that he obtain fasttng lipid, hepatic and microalb and hBa1c, [facility]  [facility], needed for past at most 6 month, generally easier just order locally. Also he needs to update eye exam,pls enter date if in last 12 months in his chart, thanks

## 2014-02-07 NOTE — Telephone Encounter (Signed)
Spoke with patient and he stated that when he goes to [place] lab that he normally is unable to get blood drawn due to poor access.  He has lab work done monthly through dialysis.   He will stop by [facility] to [facility].  He has had an eye exam recently.  Will contact dialysis to get copy of recent labs.

## 2014-02-08 NOTE — Telephone Encounter (Signed)
Request sent to [facility] in [place] for labs.

## 2014-05-06 ENCOUNTER — Encounter: Payer: Self-pay | Admitting: Cardiovascular Disease

## 2014-05-07 ENCOUNTER — Encounter: Payer: Self-pay | Admitting: Cardiovascular Disease

## 2014-05-14 ENCOUNTER — Telehealth: Payer: Self-pay

## 2014-05-14 NOTE — Telephone Encounter (Signed)
Called and scheduled patient an appt with [facility] for 5/28.  Info faxed to [place] from [facility] with Coumadin dose and last INR.  Patient aware of change in appt and that his INR will be handled from that office.  Called and cancelled appt with [facility].

## 2014-05-14 NOTE — Telephone Encounter (Signed)
Appreciate that

## 2014-05-16 ENCOUNTER — Ambulatory Visit (INDEPENDENT_AMBULATORY_CARE_PROVIDER_SITE_OTHER): Payer: Medicare Other | Admitting: *Deleted

## 2014-05-16 DIAGNOSIS — I4891 Unspecified atrial fibrillation: Secondary | ICD-10-CM

## 2014-05-16 LAB — POCT INR: INR: 2.1

## 2014-05-17 ENCOUNTER — Encounter: Payer: Self-pay | Admitting: Cardiovascular Disease

## 2014-05-17 ENCOUNTER — Emergency Department (HOSPITAL_COMMUNITY): Payer: Medicare Other

## 2014-05-17 ENCOUNTER — Emergency Department (HOSPITAL_COMMUNITY)
Admission: EM | Admit: 2014-05-17 | Discharge: 2014-05-17 | Disposition: A | Discharge status: Home / Self Care | Payer: Medicare Other | Attending: Emergency Medicine | Admitting: Emergency Medicine

## 2014-05-17 ENCOUNTER — Ambulatory Visit (INDEPENDENT_AMBULATORY_CARE_PROVIDER_SITE_OTHER): Payer: Medicare Other | Admitting: Cardiovascular Disease

## 2014-05-17 ENCOUNTER — Encounter (HOSPITAL_COMMUNITY): Payer: Self-pay | Admitting: Emergency Medicine

## 2014-05-17 VITALS — BP 94/60 | HR 144 | Ht 74.0 in | Wt 215.4 lb

## 2014-05-17 DIAGNOSIS — E785 Hyperlipidemia, unspecified: Secondary | ICD-10-CM | POA: Insufficient documentation

## 2014-05-17 DIAGNOSIS — I4892 Unspecified atrial flutter: Secondary | ICD-10-CM

## 2014-05-17 DIAGNOSIS — Z7901 Long term (current) use of anticoagulants: Secondary | ICD-10-CM | POA: Insufficient documentation

## 2014-05-17 DIAGNOSIS — N186 End stage renal disease: Secondary | ICD-10-CM | POA: Insufficient documentation

## 2014-05-17 DIAGNOSIS — N19 Unspecified kidney failure: Secondary | ICD-10-CM

## 2014-05-17 DIAGNOSIS — E669 Obesity, unspecified: Secondary | ICD-10-CM | POA: Insufficient documentation

## 2014-05-17 DIAGNOSIS — R0789 Other chest pain: Secondary | ICD-10-CM

## 2014-05-17 DIAGNOSIS — IMO0002 Reserved for concepts with insufficient information to code with codable children: Secondary | ICD-10-CM | POA: Insufficient documentation

## 2014-05-17 DIAGNOSIS — I4891 Unspecified atrial fibrillation: Secondary | ICD-10-CM

## 2014-05-17 DIAGNOSIS — K219 Gastro-esophageal reflux disease without esophagitis: Secondary | ICD-10-CM | POA: Insufficient documentation

## 2014-05-17 DIAGNOSIS — G8929 Other chronic pain: Secondary | ICD-10-CM | POA: Insufficient documentation

## 2014-05-17 DIAGNOSIS — Z8709 Personal history of other diseases of the respiratory system: Secondary | ICD-10-CM | POA: Insufficient documentation

## 2014-05-17 DIAGNOSIS — G4730 Sleep apnea, unspecified: Secondary | ICD-10-CM | POA: Insufficient documentation

## 2014-05-17 DIAGNOSIS — I959 Hypotension, unspecified: Secondary | ICD-10-CM

## 2014-05-17 DIAGNOSIS — R002 Palpitations: Secondary | ICD-10-CM | POA: Insufficient documentation

## 2014-05-17 DIAGNOSIS — Z992 Dependence on renal dialysis: Secondary | ICD-10-CM | POA: Insufficient documentation

## 2014-05-17 DIAGNOSIS — Z862 Personal history of diseases of the blood and blood-forming organs and certain disorders involving the immune mechanism: Secondary | ICD-10-CM | POA: Insufficient documentation

## 2014-05-17 DIAGNOSIS — Z79899 Other long term (current) drug therapy: Secondary | ICD-10-CM | POA: Insufficient documentation

## 2014-05-17 DIAGNOSIS — E119 Type 2 diabetes mellitus without complications: Secondary | ICD-10-CM | POA: Insufficient documentation

## 2014-05-17 DIAGNOSIS — F17200 Nicotine dependence, unspecified, uncomplicated: Secondary | ICD-10-CM | POA: Insufficient documentation

## 2014-05-17 DIAGNOSIS — I120 Hypertensive chronic kidney disease with stage 5 chronic kidney disease or end stage renal disease: Secondary | ICD-10-CM | POA: Insufficient documentation

## 2014-05-17 DIAGNOSIS — Z7982 Long term (current) use of aspirin: Secondary | ICD-10-CM | POA: Insufficient documentation

## 2014-05-17 DIAGNOSIS — Z87891 Personal history of nicotine dependence: Secondary | ICD-10-CM | POA: Insufficient documentation

## 2014-05-17 DIAGNOSIS — Z8669 Personal history of other diseases of the nervous system and sense organs: Secondary | ICD-10-CM | POA: Insufficient documentation

## 2014-05-17 DIAGNOSIS — Z794 Long term (current) use of insulin: Secondary | ICD-10-CM | POA: Insufficient documentation

## 2014-05-17 DIAGNOSIS — Z880 Allergy status to penicillin: Secondary | ICD-10-CM | POA: Insufficient documentation

## 2014-05-17 LAB — CBC WITH DIFFERENTIAL/PLATELET
BASOS PCT: 0 % (ref 0–1)
Basophils Absolute: 0 10*3/uL (ref 0.0–0.1)
Eosinophils Absolute: 0 10*3/uL (ref 0.0–0.7)
Eosinophils Relative: 0 % (ref 0–5)
HCT: 38.4 % — ABNORMAL LOW (ref 39.0–52.0)
Hemoglobin: 12.2 g/dL — ABNORMAL LOW (ref 13.0–17.0)
Lymphocytes Relative: 15 % (ref 12–46)
Lymphs Abs: 1.2 10*3/uL (ref 0.7–4.0)
MCH: 30.7 pg (ref 26.0–34.0)
MCHC: 31.8 g/dL (ref 30.0–36.0)
MCV: 96.7 fL (ref 78.0–100.0)
MONO ABS: 0.9 10*3/uL (ref 0.1–1.0)
Monocytes Relative: 11 % (ref 3–12)
NEUTROS ABS: 5.6 10*3/uL (ref 1.7–7.7)
Neutrophils Relative %: 74 % (ref 43–77)
Platelets: 228 10*3/uL (ref 150–400)
RBC: 3.97 MIL/uL — ABNORMAL LOW (ref 4.22–5.81)
RDW: 14.7 % (ref 11.5–15.5)
WBC: 7.7 10*3/uL (ref 4.0–10.5)

## 2014-05-17 LAB — BASIC METABOLIC PANEL
BUN: 33 mg/dL — ABNORMAL HIGH (ref 6–23)
CALCIUM: 8.5 mg/dL (ref 8.4–10.5)
CO2: 31 mEq/L (ref 19–32)
CREATININE: 6.36 mg/dL — AB (ref 0.50–1.35)
Chloride: 96 mEq/L (ref 96–112)
GFR calc non Af Amer: 8 mL/min — ABNORMAL LOW (ref >90)
GFR, EST AFRICAN AMERICAN: 10 mL/min — AB (ref >90)
Glucose, Bld: 176 mg/dL — ABNORMAL HIGH (ref 70–99)
Potassium: 4.2 mEq/L (ref 3.7–5.3)
Sodium: 141 mEq/L (ref 137–147)

## 2014-05-17 LAB — I-STAT TROPONIN, ED: TROPONIN I, POC: 0.03 ng/mL (ref 0.00–0.08)

## 2014-05-17 LAB — PROTIME-INR
INR: 2.16 — ABNORMAL HIGH (ref 0.00–1.49)
Prothrombin Time: 23.4 seconds — ABNORMAL HIGH (ref 11.6–15.2)

## 2014-05-17 MED ORDER — WARFARIN SODIUM 5 MG PO TABS
ORAL_TABLET | ORAL | Status: AC
  Filled 2014-05-17: qty 1

## 2014-05-17 MED ORDER — PROPOFOL 10 MG/ML IV BOLUS
40.0000 mg | Freq: Once | INTRAVENOUS | Status: AC
  Administered 2014-05-17: 40 mg via INTRAVENOUS
  Filled 2014-05-17: qty 1

## 2014-05-17 MED ORDER — WARFARIN SODIUM 5 MG PO TABS
5.0000 mg | ORAL_TABLET | Freq: Once | ORAL | Status: AC
  Administered 2014-05-17: 5 mg via ORAL
  Filled 2014-05-17: qty 1

## 2014-05-17 MED ORDER — WARFARIN - PHYSICIAN DOSING INPATIENT: Freq: Every day | Status: DC

## 2014-05-17 NOTE — ED Notes (Signed)
Pt states he had dialysis today. States his heart rate was elevated the entire time. States he went to [place] to be checked and was sent here for evaluation.

## 2014-05-17 NOTE — Sedation Documentation (Signed)
Pt shocked at 100j

## 2014-05-17 NOTE — ED Notes (Signed)
See ED note  Sharyon Cable, MD 05/17/14 [phone]

## 2014-05-17 NOTE — Sedation Documentation (Signed)
20mg  propofol IV given

## 2014-05-17 NOTE — Progress Notes (Signed)
Patient ID: Albert Lewis, male   DOB: Mar 22, 1951, 63 y.o.   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 He was hospitalized at [facility] in April for acute bronchitis which led to musculoskeletal chest pain associated with his cough. He saw Jory Sims NP in 10/2013. He takes warfarin and his INR on 05/16/2014 was 2.1. An echocardiogram performed earlier this month demonstrated normal left ventricular systolic function, EF 34-74%, mild left atrial enlargement, and mitral annular calcification.  This is my first time meeting him. Since getting out of dialysis today he has felt "worn out "and has been experiencing chest tightness. He denies shortness of breath. He said he was feeling well yesterday and before dialysis. An ECG performed in the office today demonstrates atrial flutter with a heart rate of 147 beats per minute. His blood pressure is 94/60.  He is currently taking diltiazem 240 mg daily and had been on  metoprolol 25 g twice daily, but this appears to have been discontinued earlier today, along with losartan.      Allergies  Allergen Reactions  . Actos [Pioglitazone]   . Celebrex [Celecoxib]   . Penicillins Other (See Comments)    Couldn't walk.     Current Outpatient Prescriptions  Medication Sig Dispense Refill  . albuterol (PROVENTIL HFA;VENTOLIN HFA) 108 (90 BASE) MCG/ACT inhaler Inhale 2 puffs into the lungs every 6 (six) hours as needed for wheezing.  1 Inhaler  2  . albuterol (PROVENTIL) (2.5 MG/3ML) 0.083% nebulizer solution Take 3 mLs (2.5 mg total) by nebulization every 6 (six) hours as needed for wheezing or shortness of breath.  75 mL  0  . allopurinol (ZYLOPRIM) 100 MG tablet Take 100 mg by mouth daily.       Marland Kitchen aspirin EC 81 MG tablet Take 81 mg by mouth daily.      . B  Complex-C-Folic Acid (RENAL MULTIVITAMIN FORMULA PO) Take 1 tablet by mouth daily.      . cinacalcet (SENSIPAR) 60 MG tablet Take 60 mg by mouth daily.      Marland Kitchen diltiazem (DILACOR XR) 240 MG 24 hr capsule Take 240 mg by mouth daily.      Marland Kitchen docusate sodium (COLACE) 100 MG capsule Take 200 mg by mouth 2 (two) times daily.      . fish oil-omega-3 fatty acids 1000 MG capsule Take 1 g by mouth 3 (three) times daily. SUPER OMEGA 3 FORMULA: EPA 300/DHA200      . gabapentin (NEURONTIN) 100 MG capsule Take 1 capsule (100 mg total) by mouth 2 (two) times daily.  60 capsule  3  . gemfibrozil (LOPID) 600 MG tablet Take 600 mg by mouth 2 (two) times daily.      . insulin glargine (LANTUS) 100 UNIT/ML injection Inject 22 Units into the skin at bedtime.       Marland Kitchen ipratropium (ATROVENT) 0.02 % nebulizer solution Take 2.5 mLs (0.5 mg total) by nebulization 4 (four) times daily.  75 mL  0  . losartan (COZAAR) 100 MG tablet Take 100 mg by mouth daily.      . metoprolol tartrate (LOPRESSOR) 25 MG tablet Take 25 mg by mouth 2 (two) times daily.      . pantoprazole (PROTONIX) 40 MG tablet Take 40 mg by mouth daily.      . sevelamer (RENVELA) 800 MG tablet  Take 4,800 mg by mouth 3 (three) times daily with meals. Takes 6 tablets (4800 mg) three times daily with meals and 3 tablets (2400 mg) with snack.       No current facility-administered medications for this visit.    Past Medical History  Diagnosis Date  . Paroxysmal atrial fibrillation 2/09    Abnormal stress nuclear study in 01/2008; normal coronary angiography  . Hyperlipidemia 2008  . Hypertension 1993    Mild to moderate LVH  . Obesity     sleep apnea treated with BiPAP  . Anemia   . Gastroesophageal reflux disease   . Hyperparathyroidism     Secondary  . Allergic rhinitis   . Lower gastrointestinal bleed 04/2008    diverticulosis; hemorrhoids; constipation  . Diverticular disease   . Chronic back pain     Degenerative disc disease  . ESRD (end stage  renal disease) on dialysis     Dialysis since 2007  . Renal insufficiency   . Sleep apnea 2010    CPAP followed at [place]  . Cataract     s/p extraction x 2  . Diabetes mellitus, type 2 1993    no insulin    Past Surgical History  Procedure Laterality Date  . Cataract extraction w/ intraocular lens  implant, bilateral  2008  . Lumbar peritoneal shunt    . Exploratory laparotomy      shunt complications  . Back surgery    . Cholecystectomy  1999  . Spine surgery      twice in lower back    History   Social History  . Marital Status: Legally Separated    Spouse Name: N/A    Number of Children: 2  . Years of Education: N/A   Occupational History  . Not on file.   Social History Main Topics  . Smoking status: Former Smoker    Types: Cigarettes  . Smokeless tobacco: Not on file  . Alcohol Use: No  . Drug Use: No  . Sexual Activity: Yes   Other Topics Concern  . Not on file   Social History Narrative   Married lives with spouse since 53. Patient has 11 siblings .     Filed Vitals:   05/17/14 1316  Height: 6\' 2"  (1.88 m)  Weight: 215 lb 6.4 oz (97.705 kg)    PHYSICAL EXAM General: NAD Neck: No JVD, no thyromegaly. Lungs: Bilateral rhonchi. CV: Nondisplaced PMI.  Tachycardic, normal S1/S2, no S3/S4, no murmur. No pretibial or periankle edema.  Abdomen: Soft, nontender, no hepatosplenomegaly, no distention.  Neurologic: Alert and oriented x 3.  Psych: Normal affect. Extremities: No clubbing or cyanosis.   ECG: reviewed and available in electronic records.      ASSESSMENT AND PLAN: 1. Rapid atrial flutter with consequent hypotension and chest pressure: I informed the patient that he should be hospitalized so that he can undergo cardioversion either with medications (if this were to fail) or direct current cardioversion. If I were to have him go by EMS to [facility]  and medical therapy there failed to cardiovert him, he would require electrical  cardioversion. However, there are no cardiology services on the weekend. For this reason I will have him sent by EMS directly to [facility]. He is therapeutic on warfarin. Given his cardiovascular risk factors, he should also have troponins checked to be prudent. 2. HTN: Hypotensive  in the setting of rapid atrial flutter. Cardioversion as per #1.  Dispo: I am  having my nurse called EMS so that the patient can be transferred directly to [facility] for either chemical or electrical cardioversion and further management.   Kate Sable, M.D., F.A.C.C.

## 2014-05-17 NOTE — Discharge Instructions (Signed)
Atrial Flutter °Atrial flutter is a heart rhythm that can cause the heart to beat very fast (tachycardia). It originates in the upper chambers of the heart (atria). In atrial flutter, the top chambers of the heart (atria) often beat much faster than the bottom chambers of the heart (ventricles). Atrial flutter has a regular "saw toothed" appearance in an EKG readout. An EKG is a test that records the electrical activity of the heart. Atrial flutter can cause the heart to beat up to 150 beats per minute (BPM). Atrial flutter can either be short lived (paroxysmal) or permanent.  °CAUSES  °Causes of atrial flutter can be many. Some of these include: °· Heart related issues: °· Heart attack (myocardial infarction). °· Heart failure. °· Heart valve problems. °· Poorly controlled high blood pressure (hypertension). °· Afteropen heart surgery. °· Lung related issues: °· A blood clot in the lungs (pulmonary embolism). °· Chronic obstructive pulmonary disease (COPD). Medications used to treat COPD can attribute to atrial flutter. °· Other related causes: °· Hyperthyroidism. °· Caffeine. °· Some decongestant cold medications. °· Low electrolyte levels such as potassium or magnesium. °· Cocaine. °SYMPTOMS °· An awareness of your heart beating rapidly (palpitations). °· Shortness of breath. °· Chest pain. °· Low blood pressure (hypotension). °· Dizziness or fainting. °DIAGNOSIS  °Different tests can be performed to diagnose atrial flutter.  °· An EKG. °· Holter monitor. This is a 24 hour recording of your heart rhythm. You will also be given a diary. Write down all symptoms that you have and what you were doing at the time you experienced symptoms. °· Cardiac event monitor. This small device can be worn for up to 30 days. When you have heart symptoms, you will push a button on the device. This will then record your heart rhythm. °· Echocardiogram. This is an imaging test to look at your heart. Your caregiver will look at your  heart valves and the ventricles. °· Stress Test. This test can help determine if the atrial flutter is related to exercise or if coronary artery disease is present. °· Laboratory studies will look at certain blood levels like: °· Complete blood count (CBC). °· Potassium. °· Magnesium. °· Thyroid function. °TREATMENT  °Treatment of atrial flutter varies. A combination of therapies may be used or sometimes atrial flutter may need only 1 type of treatment.  °Lab work: °If your blood work, such as your electrolytes (potassium, magnesium) or your thyroid function tests are abnormal, your caregiver will treat them accordingly.  °Medication:  °There are several different types of medications that can convert your heart to a normal rhythm and prevent atrial flutter from reoccurring.  °Nonsurgical procedures: °Nonsurgical techniques may be used to control atrial flutter. Some examples include: °· Cardioversion. This technique uses either drugs or an electrical shock to restore a normal heart rhythm: °· Cardioversion drugs may be given through an intravenous (IV) line to help "reset" the heart rhythm. °· In electrical cardioversion, your caregiver shocks your heart with electrical energy. This helps to reset the heartbeat to a normal rhythm. °· Ablation. If atrial flutter is a persistent problem, an ablation may be needed. This procedure is done under mild sedation. High frequency radio-wave energy is used to destroy the area of heart tissue responsible for atrial flutter. °SEEK IMMEDIATE MEDICAL CARE IF:  °· Dizziness. °· Near fainting or fainting. °· Shortness of breath. °· Chest pain or pressure. °· Sudden nausea or vomiting. °· Profuse sweating. °If you have the above symptoms, call your   local emergency service immediately! Do not drive yourself to the hospital. °MAKE SURE YOU:  °· Understand these instructions. °· Will watch your condition. °· Will get help right away if you are not doing well or get worse. °Document  Released: 04/24/2009 Document Revised: 02/28/2012 Document Reviewed: 04/24/2009 °[employer]® Patient Information ©2014 [employer]. ° °

## 2014-05-17 NOTE — Sedation Documentation (Signed)
37m propofol given

## 2014-05-17 NOTE — ED Provider Notes (Signed)
CSN: [id number]     Arrival date & time 05/17/14  1431 History  This chart was scribed for Sharyon Cable, MD by Roe Coombs, ED Scribe. The patient was seen in room APAH6/APAH6. Patient's care was started at 3:04 PM.   Chief Complaint  Patient presents with  . Irregular Heart Beat     Patient is a 63 y.o. male presenting with palpitations.  Palpitations Palpitations quality:  Fast Timing:  Constant Chronicity:  Recurrent Associated symptoms: chest pain (tightness)   Associated symptoms: no lower extremity edema, no shortness of breath and no vomiting   Risk factors: hx of atrial fibrillation     HPI Comments: Albert Lewis is a 63 y.o. male with history of atrial fibrillation and ESRD on hemodialysis who presents to the Emergency Department complaining of constant tachycardia onset earlier today during his dialysis treatment. He reports associated chest tightness. Patient was instructed to go to [facility] in [place] from the dialysis center for further evaluations, but EMS brought him here. He states that he received a full dialysis treatment prior to arrival in the ED. He takes Coumadin 5 mg daily and has been compliant with this regimen. He denies dyspnea, leg swelling, fever, chills, abdominal pain, vomiting, diarrhea, or syncope.   Past Medical History  Diagnosis Date  . Paroxysmal atrial fibrillation 2/09    Abnormal stress nuclear study in 01/2008; normal coronary angiography  . Hyperlipidemia 2008  . Hypertension 1993    Mild to moderate LVH  . Obesity     sleep apnea treated with BiPAP  . Anemia   . Gastroesophageal reflux disease   . Hyperparathyroidism     Secondary  . Allergic rhinitis   . Lower gastrointestinal bleed 04/2008    diverticulosis; hemorrhoids; constipation  . Diverticular disease   . Chronic back pain     Degenerative disc disease  . ESRD (end stage renal disease) on dialysis     Dialysis since 2007  . Renal insufficiency   . Sleep apnea 2010     CPAP followed at [place]  . Cataract     s/p extraction x 2  . Diabetes mellitus, type 2 1993    no insulin   Past Surgical History  Procedure Laterality Date  . Cataract extraction w/ intraocular lens  implant, bilateral  2008  . Lumbar peritoneal shunt    . Exploratory laparotomy      shunt complications  . Back surgery    . Cholecystectomy  1999  . Spine surgery      twice in lower back   Family History  Problem Relation Age of Onset  . Hypertension Mother   . Cancer Mother 25    breast   . Hypertension Sister 38  . Hypertension Daughter 82  . Diabetes Daughter    History  Substance Use Topics  . Smoking status: Former Smoker    Types: Cigarettes  . Smokeless tobacco: Not on file  . Alcohol Use: No    Review of Systems  Constitutional: Negative for fever and chills.  Respiratory: Negative for shortness of breath.   Cardiovascular: Positive for chest pain (tightness) and palpitations. Negative for leg swelling.  Gastrointestinal: Negative for vomiting, abdominal pain and diarrhea.  Neurological: Negative for syncope.  All other systems reviewed and are negative.     Allergies  Actos; Celebrex; and Penicillins  Home Medications   Prior to Admission medications   Medication Sig Start Date End Date Taking? Authorizing Provider  albuterol (PROVENTIL  HFA;VENTOLIN HFA) 108 (90 BASE) MCG/ACT inhaler Inhale 2 puffs into the lungs every 6 (six) hours as needed for wheezing. 10/19/12  Yes Alycia Rossetti, MD  albuterol (PROVENTIL) (2.5 MG/3ML) 0.083% nebulizer solution Take 3 mLs (2.5 mg total) by nebulization every 6 (six) hours as needed for wheezing or shortness of breath. 10/30/13  Yes Fayrene Helper, MD  allopurinol (ZYLOPRIM) 100 MG tablet Take 100 mg by mouth daily.    Yes Historical Provider, MD  aspirin EC 81 MG tablet Take 81 mg by mouth daily.   Yes Historical Provider, MD  B Complex-C-Folic Acid (RENAL MULTIVITAMIN FORMULA PO) Take 1 tablet by mouth daily.    Yes Historical Provider, MD  cetirizine (ZYRTEC) 10 MG tablet Take 10 mg by mouth daily.   Yes Historical Provider, MD  cinacalcet (SENSIPAR) 60 MG tablet Take 60 mg by mouth daily.   Yes Historical Provider, MD  diltiazem (DILACOR XR) 240 MG 24 hr capsule Take 240 mg by mouth daily.   Yes Historical Provider, MD  docusate sodium (COLACE) 100 MG capsule Take 200 mg by mouth 2 (two) times daily.   Yes Historical Provider, MD  fish oil-omega-3 fatty acids 1000 MG capsule Take 1 g by mouth 3 (three) times daily. SUPER OMEGA 3 FORMULA: EPA 300/DHA200   Yes Historical Provider, MD  gabapentin (NEURONTIN) 100 MG capsule Take 1 capsule (100 mg total) by mouth 2 (two) times daily. 01/29/14  Yes Fayrene Helper, MD  gabapentin (NEURONTIN) 300 MG capsule Take 300 mg by mouth 2 (two) times daily.   Yes Historical Provider, MD  gemfibrozil (LOPID) 600 MG tablet Take 600 mg by mouth 2 (two) times daily.   Yes Historical Provider, MD  insulin glargine (LANTUS) 100 UNIT/ML injection Inject 22 Units into the skin at bedtime.    Yes Historical Provider, MD  ipratropium (ATROVENT) 0.02 % nebulizer solution Take 2.5 mLs (0.5 mg total) by nebulization 4 (four) times daily. 10/30/13  Yes Fayrene Helper, MD  losartan (COZAAR) 100 MG tablet Take 100 mg by mouth daily.   Yes Historical Provider, MD  metoprolol tartrate (LOPRESSOR) 25 MG tablet Take 25 mg by mouth 2 (two) times daily.   Yes Historical Provider, MD  pantoprazole (PROTONIX) 40 MG tablet Take 40 mg by mouth daily.   Yes Historical Provider, MD  predniSONE (DELTASONE) 20 MG tablet Take 10 mg by mouth daily. Taper - 2 pills left   Yes Historical Provider, MD  sevelamer (RENVELA) 800 MG tablet Take 4,800 mg by mouth 3 (three) times daily with meals. Takes 6 tablets (4800 mg) three times daily with meals and 3 tablets (2400 mg) with snack.   Yes Historical Provider, MD  warfarin (COUMADIN) 5 MG tablet Take 5 mg by mouth every evening. Per Lenice Pressman  Historical Provider, MD   Triage Vitals: BP 105/54  Pulse 122  Temp(Src) 98.7 F (37.1 C) (Oral)  Resp 18  Ht 6\' 2"  (1.88 m)  Wt 215 lb (97.523 kg)  BMI 27.59 kg/m2  SpO2 97% Physical Exam CONSTITUTIONAL: Well developed/well nourished HEAD: Normocephalic/atraumatic EYES: EOMI/PERRL ENMT: Mucous membranes moist NECK: supple no meningeal signs SPINE:entire spine nontender CV: Tachycardic, irregular. LUNGS: Lungs are clear to auscultation bilaterally, no apparent distress ABDOMEN: soft, nontender, no rebound or guarding GU:no cva tenderness NEURO: Pt is awake/alert, moves all extremitiesx4 EXTREMITIES: pulses normal, full ROM. Dialysis access to left arm with thrill noted. SKIN: warm, color normal PSYCH: no abnormalities of mood noted  ED Course  CARDIOVERSION Date/Time: 05/17/2014 5:30 PM Performed by: Sharyon Cable Authorized by: Sharyon Cable Consent: written consent obtained. Risks and benefits: risks, benefits and alternatives were discussed Consent given by: patient Patient identity confirmed: verbally with patient, arm band and provided demographic data Time out: Immediately prior to procedure a "time out" was called to verify the correct patient, procedure, equipment, support staff and site/side marked as required. Cardioversion basis: elective Indications comments: failure of medications and symptomatic atrial flutter Pre-procedure rhythm: atrial flutter Patient position: patient was placed in a supine position Chest area: chest area exposed Electrodes: pads Electrodes placed: anterior-posterior Number of attempts: 1 Attempt 1 mode: synchronous Attempt 1 shock (in Joules): 100 Attempt 1 outcome: conversion to normal sinus rhythm Post-procedure rhythm: normal sinus rhythm Complications: no complications Patient tolerance: Patient tolerated the procedure well with no immediate complications. Comments: Pt without any complications No focal neuro  deficits post procedure Pt tolerated well    Procedural sedation Performed by: Sharyon Cable Consent: Verbal consent obtained. Written consent obtained Risks and benefits: risks, benefits and alternatives were discussed Required items: required  devices, and special equipment available Patient identity confirmed: arm band and provided demographic data Time out: Immediately prior to procedure a "time out" was called to verify the correct patient, procedure, equipment, support staff and site/side marked as required. Sedation type: moderate (conscious) sedation NPO time confirmed and considered Sedatives: PROPOFOL Physician Time at Bedside: 17 Vitals: Vital signs were monitored during sedation. Cardiac Monitor, pulse oximeter Patient tolerance: Patient tolerated the procedure well with no immediate complications. Comments: Pt with uneventful recovery. Returned to pre-procedural sedation baseline     COORDINATION OF CARE: 3:09 PM- Patient informed of current plan for treatment and evaluation and agrees with plan at this time.  3:32 PM Pt had initially been sent to [place] by his cardiologist dr Bronson Ing EMS brought pt to this ED at [place] has been anticoagulated (INR was 2.1 yesterday) and would be safe for emergency department cardioversion.  Dr Bronson Ing recommends 100J synchronized cardioversion for aflutter.  If converts and no other issues can be discharged 4:43 PM Pt still in aflutter with INR>2.  After discussion with his cardiologist, he would benefit from cardioversion into sinus   He is anticoagulatED and reports med complaince We discussed risk/benefits and pt agrees to sedation/cardioversion Immediately prior to procedure EKG performed and still in atrial flutter 5:53 PM Pt now back in sinus rhythm Pt is in no distress without any focal neuro deficits 6:43 PM PT TAKING PO HE WANTS TO GO HOME HE APPEARS AT BASELINE HE IS STILL IN SINUS RHYTHM PER  MONITOR MOST RECENT VITALS: BP 119/58  Pulse 99  Temp(Src) 98.7 F (37.1 C) (Oral)  Resp 14  Ht 6\' 2"  (1.88 m)  Wt 215 lb (97.523 kg)  BMI 27.59 kg/m2  SpO2 99% WE DISCUSSED RETURN PRECAUTIONS HE WILL CALL/SEE HIS CARDIOLOGIST ON Monday HE WAS ORDERED COUMADIN PRIOR TO DISCHARGE HOME FOR HIS EVENING MEDS Labs Review Labs Reviewed  BASIC METABOLIC PANEL - Abnormal; Notable for the following:    Glucose, Bld 176 (*)    BUN 33 (*)    Creatinine, Ser 6.36 (*)    GFR calc non Af Amer 8 (*)    GFR calc Af Amer 10 (*)    All other components within normal limits  CBC WITH DIFFERENTIAL - Abnormal; Notable for the following:    RBC 3.97 (*)    Hemoglobin 12.2 (*)  HCT 38.4 (*)    All other components within normal limits  PROTIME-INR - Abnormal; Notable for the following:    Prothrombin Time 23.4 (*)    INR 2.16 (*)    All other components within normal limits  I-STAT TROPOININ, ED    Imaging Review Dg Chest Portable 1 View  05/17/2014   CLINICAL DATA:  Chest tightness  EXAM: PORTABLE CHEST - 1 VIEW  COMPARISON:  May 06, 2014 chest radiograph and chest CT  FINDINGS: There is no edema or consolidation. Heart is upper normal in size with normal pulmonary vascularity. No adenopathy. No pneumothorax. No bone lesions.  IMPRESSION: No edema or consolidation.   Electronically Signed   By: Lowella Grip M.D.   On: 05/17/2014 15:32     EKG Interpretation   Date/Time:  Friday May 17 2014 14:40:52 EDT Ventricular Rate:  143 PR Interval:    QRS Duration: 92 QT Interval:  334 QTC Calculation: 515 R Axis:   3 Text Interpretation:  Atrial flutter with 2:1 A-V conduction Marked ST  abnormality, possible inferior subendocardial injury Abnormal ECG changed  from prior Confirmed by Christy Gentles  MD, [place] ([id number]) on 05/17/2014 3:03:14  PM      EKG Interpretation  Date/Time:  Friday May 17 2014 17:25:24 EDT Ventricular Rate:  99 PR Interval:  224 QRS Duration: 101 QT  Interval:  362 QTC Calculation: 464 R Axis:   92 Text Interpretation:  Sinus rhythm Prolonged PR interval Left atrial enlargement Right axis deviation Minimal ST elevation, inferior leads Confirmed by Christy Gentles  MD, Elenore Rota ([id number]) on 05/17/2014 5:47:58 PM       MDM   Final diagnoses:  Atrial flutter  Renal failure    Nursing notes including past medical history and social history reviewed and considered in documentation Labs/vital reviewed and considered xrays reviewed and considered Previous records reviewed and considered   I personally performed the services described in this documentation, which was scribed in my presence. The recorded information has been reviewed and is accurate.      Sharyon Cable, MD 05/17/14 [phone]

## 2014-05-17 NOTE — Patient Instructions (Signed)
   Transported to [facility] via EMS

## 2014-05-17 NOTE — ED Notes (Signed)
Pt awake/alert, drinking ginger ale per request. nad noted.

## 2014-05-24 ENCOUNTER — Ambulatory Visit (INDEPENDENT_AMBULATORY_CARE_PROVIDER_SITE_OTHER): Payer: Medicare Other | Admitting: *Deleted

## 2014-05-24 DIAGNOSIS — I4891 Unspecified atrial fibrillation: Secondary | ICD-10-CM

## 2014-05-24 LAB — POCT INR: INR: 4

## 2014-06-04 ENCOUNTER — Telehealth: Payer: Self-pay | Admitting: *Deleted

## 2014-06-04 NOTE — Telephone Encounter (Signed)
Multiple attempts calling pt.  Cell phone does not work.

## 2014-06-04 NOTE — Telephone Encounter (Signed)
THINKS that his blood thinner is causing his pressures to drop while on dialysis machine

## 2014-06-05 NOTE — Telephone Encounter (Signed)
Cell phone still does not work

## 2014-06-07 ENCOUNTER — Ambulatory Visit (INDEPENDENT_AMBULATORY_CARE_PROVIDER_SITE_OTHER): Payer: Medicare Other | Admitting: *Deleted

## 2014-06-07 ENCOUNTER — Telehealth: Payer: Self-pay | Admitting: *Deleted

## 2014-06-07 DIAGNOSIS — I4891 Unspecified atrial fibrillation: Secondary | ICD-10-CM

## 2014-06-07 LAB — POCT INR: INR: 1.2

## 2014-06-07 NOTE — Telephone Encounter (Signed)
Attempted to return call - message stated number or code is incorrect.

## 2014-06-07 NOTE — Telephone Encounter (Signed)
Message copied by Laurine Blazer on Fri Jun 07, 2014  1:36 PM ------      Message from: Malen Gauze      Created: Fri Jun 07, 2014  1:28 PM       Plese call pt.  Was in this am for INR check.  States HR was 145 at dialysis this am.  Pulse feels like he is back in Afib.  States all rhythm meds were stopped except Metoprolol, which he started back on Monday, per Dr Hinda Lenis.  Having problems with low BP's at dialysis. ------

## 2014-06-07 NOTE — Telephone Encounter (Signed)
Lynnea Ferrier (daughter) - [phone] was listed under contact information.  Left message to return call.

## 2014-06-11 NOTE — Telephone Encounter (Signed)
Patient notified.  OV scheduled for Monday, 06/17/2014 at 4:20 with Dr. Bronson Ing.  States he is feeling okay now & feels like he can wait till Monday.  Advised him to call if anything changes before then.  Patient verbalized understanding.

## 2014-06-11 NOTE — Telephone Encounter (Signed)
Left message to return call 

## 2014-06-14 ENCOUNTER — Ambulatory Visit (INDEPENDENT_AMBULATORY_CARE_PROVIDER_SITE_OTHER): Payer: Medicare Other | Admitting: *Deleted

## 2014-06-14 DIAGNOSIS — I4891 Unspecified atrial fibrillation: Secondary | ICD-10-CM

## 2014-06-14 LAB — POCT INR: INR: 2.3

## 2014-06-17 ENCOUNTER — Encounter: Payer: Self-pay | Admitting: Cardiovascular Disease

## 2014-06-17 ENCOUNTER — Emergency Department (HOSPITAL_COMMUNITY): Payer: Medicare Other

## 2014-06-17 ENCOUNTER — Encounter (HOSPITAL_COMMUNITY): Payer: Self-pay | Admitting: Emergency Medicine

## 2014-06-17 ENCOUNTER — Inpatient Hospital Stay (HOSPITAL_COMMUNITY)
Admission: EM | Admit: 2014-06-17 | Discharge: 2014-06-19 | DRG: 250 | Disposition: A | Discharge status: Home / Self Care | Payer: Medicare Other | Attending: Internal Medicine | Admitting: Internal Medicine

## 2014-06-17 ENCOUNTER — Ambulatory Visit (INDEPENDENT_AMBULATORY_CARE_PROVIDER_SITE_OTHER): Payer: Medicare Other | Admitting: Cardiovascular Disease

## 2014-06-17 VITALS — BP 103/62 | HR 123 | Ht 74.0 in | Wt 252.0 lb

## 2014-06-17 DIAGNOSIS — E875 Hyperkalemia: Secondary | ICD-10-CM | POA: Diagnosis present

## 2014-06-17 DIAGNOSIS — E1122 Type 2 diabetes mellitus with diabetic chronic kidney disease: Secondary | ICD-10-CM

## 2014-06-17 DIAGNOSIS — I4892 Unspecified atrial flutter: Secondary | ICD-10-CM

## 2014-06-17 DIAGNOSIS — IMO0001 Reserved for inherently not codable concepts without codable children: Secondary | ICD-10-CM | POA: Diagnosis present

## 2014-06-17 DIAGNOSIS — I4891 Unspecified atrial fibrillation: Secondary | ICD-10-CM

## 2014-06-17 DIAGNOSIS — Z794 Long term (current) use of insulin: Secondary | ICD-10-CM

## 2014-06-17 DIAGNOSIS — Z7901 Long term (current) use of anticoagulants: Secondary | ICD-10-CM

## 2014-06-17 DIAGNOSIS — M549 Dorsalgia, unspecified: Secondary | ICD-10-CM

## 2014-06-17 DIAGNOSIS — K219 Gastro-esophageal reflux disease without esophagitis: Secondary | ICD-10-CM | POA: Diagnosis present

## 2014-06-17 DIAGNOSIS — I959 Hypotension, unspecified: Secondary | ICD-10-CM

## 2014-06-17 DIAGNOSIS — G4733 Obstructive sleep apnea (adult) (pediatric): Secondary | ICD-10-CM

## 2014-06-17 DIAGNOSIS — Z79899 Other long term (current) drug therapy: Secondary | ICD-10-CM

## 2014-06-17 DIAGNOSIS — R0789 Other chest pain: Secondary | ICD-10-CM

## 2014-06-17 DIAGNOSIS — E669 Obesity, unspecified: Secondary | ICD-10-CM

## 2014-06-17 DIAGNOSIS — N186 End stage renal disease: Secondary | ICD-10-CM

## 2014-06-17 DIAGNOSIS — I10 Essential (primary) hypertension: Secondary | ICD-10-CM

## 2014-06-17 DIAGNOSIS — E785 Hyperlipidemia, unspecified: Secondary | ICD-10-CM

## 2014-06-17 DIAGNOSIS — Z6833 Body mass index (BMI) 33.0-33.9, adult: Secondary | ICD-10-CM

## 2014-06-17 DIAGNOSIS — Z0000 Encounter for general adult medical examination without abnormal findings: Secondary | ICD-10-CM

## 2014-06-17 DIAGNOSIS — E1129 Type 2 diabetes mellitus with other diabetic kidney complication: Secondary | ICD-10-CM

## 2014-06-17 DIAGNOSIS — Z87891 Personal history of nicotine dependence: Secondary | ICD-10-CM

## 2014-06-17 DIAGNOSIS — I483 Typical atrial flutter: Secondary | ICD-10-CM

## 2014-06-17 DIAGNOSIS — N2581 Secondary hyperparathyroidism of renal origin: Secondary | ICD-10-CM

## 2014-06-17 DIAGNOSIS — D638 Anemia in other chronic diseases classified elsewhere: Secondary | ICD-10-CM

## 2014-06-17 DIAGNOSIS — I120 Hypertensive chronic kidney disease with stage 5 chronic kidney disease or end stage renal disease: Secondary | ICD-10-CM | POA: Diagnosis present

## 2014-06-17 DIAGNOSIS — Z992 Dependence on renal dialysis: Secondary | ICD-10-CM

## 2014-06-17 DIAGNOSIS — E119 Type 2 diabetes mellitus without complications: Secondary | ICD-10-CM | POA: Diagnosis present

## 2014-06-17 DIAGNOSIS — E049 Nontoxic goiter, unspecified: Secondary | ICD-10-CM

## 2014-06-17 DIAGNOSIS — M109 Gout, unspecified: Secondary | ICD-10-CM

## 2014-06-17 HISTORY — DX: Unspecified atrial flutter: I48.92

## 2014-06-17 LAB — POTASSIUM: Potassium: 5.2 mEq/L (ref 3.7–5.3)

## 2014-06-17 LAB — CBC
HEMATOCRIT: 43.3 % (ref 39.0–52.0)
HEMOGLOBIN: 14.1 g/dL (ref 13.0–17.0)
MCH: 31.3 pg (ref 26.0–34.0)
MCHC: 32.6 g/dL (ref 30.0–36.0)
MCV: 96 fL (ref 78.0–100.0)
Platelets: 222 10*3/uL (ref 150–400)
RBC: 4.51 MIL/uL (ref 4.22–5.81)
RDW: 14 % (ref 11.5–15.5)
WBC: 6.4 10*3/uL (ref 4.0–10.5)

## 2014-06-17 LAB — I-STAT TROPONIN, ED: TROPONIN I, POC: 0.03 ng/mL (ref 0.00–0.08)

## 2014-06-17 LAB — BASIC METABOLIC PANEL
BUN: 22 mg/dL (ref 6–23)
CHLORIDE: 94 meq/L — AB (ref 96–112)
CO2: 27 mEq/L (ref 19–32)
Calcium: 8.9 mg/dL (ref 8.4–10.5)
Creatinine, Ser: 8 mg/dL — ABNORMAL HIGH (ref 0.50–1.35)
GFR, EST AFRICAN AMERICAN: 7 mL/min — AB (ref >90)
GFR, EST NON AFRICAN AMERICAN: 6 mL/min — AB (ref >90)
Glucose, Bld: 173 mg/dL — ABNORMAL HIGH (ref 70–99)
POTASSIUM: 5.4 meq/L — AB (ref 3.7–5.3)
SODIUM: 141 meq/L (ref 137–147)

## 2014-06-17 LAB — PROTIME-INR
INR: 2 — ABNORMAL HIGH (ref 0.00–1.49)
PROTHROMBIN TIME: 22.7 s — AB (ref 11.6–15.2)

## 2014-06-17 LAB — PRO B NATRIURETIC PEPTIDE: Pro B Natriuretic peptide (BNP): 2925 pg/mL — ABNORMAL HIGH (ref 0–125)

## 2014-06-17 MED ORDER — DILTIAZEM HCL 25 MG/5ML IV SOLN
20.0000 mg | Freq: Once | INTRAVENOUS | Status: DC
Start: 2014-06-17 — End: 2014-06-18
  Filled 2014-06-17: qty 5

## 2014-06-17 MED ORDER — METOPROLOL TARTRATE 25 MG PO TABS
25.0000 mg | ORAL_TABLET | Freq: Once | ORAL | Status: AC
  Administered 2014-06-17: 25 mg via ORAL
  Filled 2014-06-17: qty 1

## 2014-06-17 NOTE — Progress Notes (Signed)
Patient ID: Albert Lewis, male   DOB: 06/01/1951, 63 y.o.   MRN: [id number]      SUBJECTIVE: The patient is a 63 year old male with a history of diabetes mellitus, paroxysmal atrial fibrillation and flutter, hypertension, hyperlipidemia, and end-stage renal disease on hemodialysis (M,W,F).  At his last visit on May 29, he was in rapid atrial flutter with hypotension and chest pressure. He was sent by EMS to [facility] and underwent successful direct current cardioversion in the EEG by Dr. Christy Gentles.  He had been on long-acting diltiazem 240 mg daily but tells me he was instructed to stop this and start taking a lower dose (80 mg?) by his neprhologist, Dr. Lowanda Foster, due to low blood pressure during dialysis. He also stopped losartan. He is taking metoprolol 25 mg bid.  ECG performed in the office today demonstrates what appears to be atrial flutter with a heart rate of 144 beats per minute.  After his cardioversion, his exertional chest pain resolved. However, since he began experiencing palpitations last Wednesday, he has had a recurrence of exertional chest discomfort. He denies syncope.  Allergies  Allergen Reactions  . Actos [Pioglitazone]   . Celebrex [Celecoxib]   . Penicillins Other (See Comments)    Couldn't walk.     Current Outpatient Prescriptions  Medication Sig Dispense Refill  . albuterol (PROVENTIL HFA;VENTOLIN HFA) 108 (90 BASE) MCG/ACT inhaler Inhale 2 puffs into the lungs every 6 (six) hours as needed for wheezing.  1 Inhaler  2  . albuterol (PROVENTIL) (2.5 MG/3ML) 0.083% nebulizer solution Take 3 mLs (2.5 mg total) by nebulization every 6 (six) hours as needed for wheezing or shortness of breath.  75 mL  0  . allopurinol (ZYLOPRIM) 100 MG tablet Take 100 mg by mouth daily.       Marland Kitchen aspirin EC 81 MG tablet Take 81 mg by mouth daily.      . B Complex-C-Folic Acid (RENAL MULTIVITAMIN FORMULA PO) Take 1 tablet by mouth daily.      . cetirizine (ZYRTEC) 10 MG  tablet Take 10 mg by mouth daily.      Marland Kitchen diltiazem (DILACOR XR) 240 MG 24 hr capsule Take 240 mg by mouth daily.      Marland Kitchen docusate sodium (COLACE) 100 MG capsule Take 200 mg by mouth 2 (two) times daily.      . fish oil-omega-3 fatty acids 1000 MG capsule Take 1 g by mouth 3 (three) times daily. SUPER OMEGA 3 FORMULA: EPA 300/DHA200      . gabapentin (NEURONTIN) 100 MG capsule Take 1 capsule (100 mg total) by mouth 2 (two) times daily.  60 capsule  3  . gabapentin (NEURONTIN) 300 MG capsule Take 300 mg by mouth 2 (two) times daily.      Marland Kitchen gemfibrozil (LOPID) 600 MG tablet Take 600 mg by mouth 2 (two) times daily.      . insulin glargine (LANTUS) 100 UNIT/ML injection Inject 22 Units into the skin at bedtime.       Marland Kitchen ipratropium (ATROVENT) 0.02 % nebulizer solution Take 2.5 mLs (0.5 mg total) by nebulization 4 (four) times daily.  75 mL  0  . losartan (COZAAR) 100 MG tablet Take 100 mg by mouth daily.      . metoprolol tartrate (LOPRESSOR) 25 MG tablet Take 25 mg by mouth 2 (two) times daily.      . pantoprazole (PROTONIX) 40 MG tablet Take 40 mg by mouth daily.      Marland Kitchen  predniSONE (DELTASONE) 20 MG tablet Take 10 mg by mouth daily. Taper - 2 pills left      . sevelamer (RENVELA) 800 MG tablet Take 4,800 mg by mouth 3 (three) times daily with meals. Takes 6 tablets (4800 mg) three times daily with meals and 3 tablets (2400 mg) with snack.      . warfarin (COUMADIN) 5 MG tablet Take 5 mg by mouth every evening. Per Lattie Haw       No current facility-administered medications for this visit.    Past Medical History  Diagnosis Date  . Paroxysmal atrial fibrillation 2/09    Abnormal stress nuclear study in 01/2008; normal coronary angiography  . Hyperlipidemia 2008  . Hypertension 1993    Mild to moderate LVH  . Obesity     sleep apnea treated with BiPAP  . Anemia   . Gastroesophageal reflux disease   . Hyperparathyroidism     Secondary  . Allergic rhinitis   . Lower gastrointestinal bleed 04/2008      diverticulosis; hemorrhoids; constipation  . Diverticular disease   . Chronic back pain     Degenerative disc disease  . ESRD (end stage renal disease) on dialysis     Dialysis since 2007  . Renal insufficiency   . Sleep apnea 2010    CPAP followed at [place]  . Cataract     s/p extraction x 2  . Diabetes mellitus, type 2 1993    no insulin    Past Surgical History  Procedure Laterality Date  . Cataract extraction w/ intraocular lens  implant, bilateral  2008  . Lumbar peritoneal shunt    . Exploratory laparotomy      shunt complications  . Back surgery    . Cholecystectomy  1999  . Spine surgery      twice in lower back    History   Social History  . Marital Status: Divorced    Spouse Name: N/A    Number of Children: 2  . Years of Education: N/A   Occupational History  . Not on file.   Social History Main Topics  . Smoking status: Former Smoker    Types: Cigarettes  . Smokeless tobacco: Never Used  . Alcohol Use: No  . Drug Use: No  . Sexual Activity: Yes   Other Topics Concern  . Not on file   Social History Narrative   Married lives with spouse since 40. Patient has 11 siblings .     Filed Vitals:   06/17/14 1614  Height: 6\' 2"  (1.88 m)  Weight: 252 lb (114.306 kg)   BP 103/62  Pulse 123   PHYSICAL EXAM General: NAD Neck: No JVD, no thyromegaly. Lungs: Clear to auscultation bilaterally with normal respiratory effort. CV: Nondisplaced PMI.  Tachycardic, regular, normal S1/S2, no S3/S4, no murmur. No pretibial or periankle edema.   Abdomen: Soft, nontender, no hepatosplenomegaly, no distention.  Neurologic: Alert and oriented x 3.  Psych: Normal affect. Extremities: No clubbing or cyanosis.   ECG: reviewed and available in electronic records.      ASSESSMENT AND PLAN: 1. Rapid atrial flutter: This demonstrates a recurrence since his last office visit. He will need to be cardioverted as his calcium channel blocker was decreased due to  low blood pressure. He is taking metoprolol 25 mg bid. Although, I suspect part of his hypotension is due to rapid atrial flutter. He will eventually need antiarrhythmic therapy vs ablation. I offered sending him by ambulance to [facility], but he would prefer to have a friend drive him today. I will discontinue aspirin as he is taking warfarin. INR 2.3 on 6/26. I spoke with Dr. Rayann Heman who will arrange for TEE with cardioversion, as INR has fluctuated with a level of 4 on 6/5 and 1.2 on 6/19.  2. Hypertension: Hypotensive due to rapid atrial flutter. Off losartan now.   Dispo: f/u after hospitalization.  Kate Sable, M.D., F.A.C.C.

## 2014-06-17 NOTE — ED Notes (Signed)
Dr. Kathrynn Humble aware of pt's HR.

## 2014-06-17 NOTE — ED Notes (Signed)
X-ray at bedside

## 2014-06-17 NOTE — ED Notes (Signed)
Reports having dialysis today, noticed heart was racing. Having mild chest pains and sob. HR 140 at triage.

## 2014-06-17 NOTE — Patient Instructions (Signed)
Your physician recommends that you go to [facility] now. Your physician has recommended you make the following change in your medication:  STOP ASPIRIN.  Continue all other medications the same including your warfarin.

## 2014-06-17 NOTE — ED Notes (Signed)
Dr Kathrynn Humble aware of HR. States to hold cardizem at this time.

## 2014-06-17 NOTE — H&P (Signed)
[facility] History and Physical  CASIN FEDERICI WGN:[id number] DOB: 25-Oct-1951 DOA: 06/17/2014  Referring physician: ER physician. PCP: Tula Nakayama, MD   Chief Complaint: Tachycardia.  HPI: Albert Lewis is a 63 y.o. male with history of atrial fibrillation, diabetes mellitus, ESRD on hemodialysis Monday Wednesday and Friday, OSA, hypertension, hyperlipidemia was referred to the ER by patient's cardiologist as patient was found to be in atrial flutter with RVR. In the ER patient was initially found to be in atrial flutter with RVR which spontaneously reduced rate to around 100 per minute. Patient is still in atrial flutter. Patient states that he had dialysis today and had 4 PM followup office visit with patient's cardiologist and during that visit patient started having palpitations. Patient has been having some chest pressure along with it. At this time chest pressure has improved. Patient states that last few weeks his blood pressure was significantly dropping and patient's nephrologist has discontinued his Cardizem and his only on metoprolol 25 mg twice daily for last one week. His nephrologist has called in Cardizem tablets at a low dose which has yet to receive through the mail. Patient otherwise denies any nausea vomiting shortness of breath diarrhea fever chills headache focal deficits.  Review of Systems: As presented in the history of presenting illness, rest negative.  Past Medical History  Diagnosis Date  . Paroxysmal atrial fibrillation 2/09    Abnormal stress nuclear study in 01/2008; normal coronary angiography  . Hyperlipidemia 2008  . Hypertension 1993    Mild to moderate LVH  . Obesity     sleep apnea treated with BiPAP  . Anemia   . Gastroesophageal reflux disease   . Hyperparathyroidism     Secondary  . Allergic rhinitis   . Lower gastrointestinal bleed 04/2008    diverticulosis; hemorrhoids; constipation  . Diverticular disease   . Chronic back pain      Degenerative disc disease  . ESRD (end stage renal disease) on dialysis     Dialysis since 2007  . Renal insufficiency   . Sleep apnea 2010    CPAP followed at [place]  . Cataract     s/p extraction x 2  . Diabetes mellitus, type 2 1993    no insulin   Past Surgical History  Procedure Laterality Date  . Cataract extraction w/ intraocular lens  implant, bilateral  2008  . Lumbar peritoneal shunt    . Exploratory laparotomy      shunt complications  . Back surgery    . Cholecystectomy  1999  . Spine surgery      twice in lower back   Social History:  reports that he has quit smoking. His smoking use included Cigarettes. He smoked 0.00 packs per day. He has never used smokeless tobacco. He reports that he does not drink alcohol or use illicit drugs. Where does patient live  home. Can patient participate in ADLs?  Yes.  Allergies  Allergen Reactions  . Actos [Pioglitazone]   . Celebrex [Celecoxib]   . Penicillins Other (See Comments)    Couldn't walk.     Family History:  Family History  Problem Relation Age of Onset  . Hypertension Mother   . Cancer Mother 24    breast   . Hypertension Sister 18  . Hypertension Daughter 15  . Diabetes Daughter       Prior to Admission medications   Medication Sig Start Date End Date Taking? Authorizing Provider  acetaminophen (TYLENOL) 325 MG tablet Take  650 mg by mouth every 6 (six) hours as needed for moderate pain.   Yes Historical Provider, MD  albuterol (PROVENTIL HFA;VENTOLIN HFA) 108 (90 BASE) MCG/ACT inhaler Inhale 2 puffs into the lungs every 6 (six) hours as needed for wheezing. 10/19/12  Yes Alycia Rossetti, MD  albuterol (PROVENTIL) (2.5 MG/3ML) 0.083% nebulizer solution Take 3 mLs (2.5 mg total) by nebulization every 6 (six) hours as needed for wheezing or shortness of breath. 10/30/13  Yes Fayrene Helper, MD  allopurinol (ZYLOPRIM) 100 MG tablet Take 100 mg by mouth daily.    Yes Historical Provider, MD  B  Complex-C-Folic Acid (RENAL MULTIVITAMIN FORMULA PO) Take 1 tablet by mouth daily.   Yes Historical Provider, MD  cetirizine (ZYRTEC) 10 MG tablet Take 10 mg by mouth daily as needed for allergies.    Yes Historical Provider, MD  cinacalcet (SENSIPAR) 90 MG tablet Take 90 mg by mouth daily.   Yes Historical Provider, MD  docusate sodium (COLACE) 100 MG capsule Take 200 mg by mouth 2 (two) times daily as needed for moderate constipation.    Yes Historical Provider, MD  fish oil-omega-3 fatty acids 1000 MG capsule Take 2 g by mouth 3 (three) times daily. SUPER OMEGA 3 FORMULA: EPA 300/DHA200   Yes Historical Provider, MD  gabapentin (NEURONTIN) 100 MG capsule Take 1 capsule (100 mg total) by mouth 2 (two) times daily. 01/29/14  Yes Fayrene Helper, MD  gemfibrozil (LOPID) 600 MG tablet Take 600 mg by mouth 2 (two) times daily.   Yes Historical Provider, MD  HYDROcodone-acetaminophen (NORCO/VICODIN) 5-325 MG per tablet Take 1 tablet by mouth 2 (two) times daily.   Yes Historical Provider, MD  insulin glargine (LANTUS) 100 UNIT/ML injection Inject 22 Units into the skin at bedtime.    Yes Historical Provider, MD  ipratropium (ATROVENT) 0.02 % nebulizer solution Take 2.5 mLs (0.5 mg total) by nebulization 4 (four) times daily. 10/30/13  Yes Fayrene Helper, MD  metoprolol tartrate (LOPRESSOR) 25 MG tablet Take 25 mg by mouth 2 (two) times daily.   Yes Historical Provider, MD  pantoprazole (PROTONIX) 40 MG tablet Take 40 mg by mouth daily.   Yes Historical Provider, MD  PRESCRIPTION MEDICATION Place 1 drop into both eyes 3 (three) times daily. For dry eyes   Yes Historical Provider, MD  sevelamer (RENVELA) 800 MG tablet Take 4,800 mg by mouth 3 (three) times daily with meals. Takes 6 tablets (4800 mg) three times daily with meals and 3 tablets (2400 mg) with snack.   Yes Historical Provider, MD  warfarin (COUMADIN) 5 MG tablet Take 5 mg by mouth every evening. Per Lenice Pressman Historical Provider, MD     Physical Exam: Filed Vitals:   06/17/14 2230 06/17/14 2256 06/17/14 2300 06/17/14 2315  BP: 146/97 146/97 165/46 166/57  Pulse: 72 88 51 89  Temp:      TempSrc:      Resp: 20 14 19 15   Height:      Weight:      SpO2: 95% 93% 96% 93%     General:  Well-developed well-nourished.  Eyes:  Anicteric no pallor.  ENT:  No discharge from the ears eyes nose or mouth.  Neck:  No mass felt.  Cardiovascular:  S1-S2 heard.  Respiratory:  No rhonchi or crepitations.  Abdomen:  Soft nontender bowel sounds present. No guarding or rigidity.  Skin:  No rash.  Musculoskeletal:  No edema.  Psychiatric:  Appears normal.  Neurologic:  Alert awake oriented to time place and person. Moves all extremities.  Labs on Admission:  Basic Metabolic Panel:  Recent Labs Lab 06/17/14 1940  NA 141  K 5.4*  CL 94*  CO2 27  GLUCOSE 173*  BUN 22  CREATININE 8.00*  CALCIUM 8.9   Liver Function Tests: No results found for this basename: AST, ALT, ALKPHOS, BILITOT, PROT, ALBUMIN,  in the last 168 hours No results found for this basename: LIPASE, AMYLASE,  in the last 168 hours No results found for this basename: AMMONIA,  in the last 168 hours CBC:  Recent Labs Lab 06/17/14 1940  WBC 6.4  HGB 14.1  HCT 43.3  MCV 96.0  PLT 222   Cardiac Enzymes: No results found for this basename: CKTOTAL, CKMB, CKMBINDEX, TROPONINI,  in the last 168 hours  BNP (last 3 results)  Recent Labs  06/17/14 1940  PROBNP 2925.0*   CBG: No results found for this basename: GLUCAP,  in the last 168 hours  Radiological Exams on Admission: Dg Chest Port 1 View  06/17/2014   CLINICAL DATA:  Five day history of mid chest pain and shortness of breath. Atrial fibrillation. Current history of hypertension and diabetes.  EXAM: PORTABLE CHEST - 1 VIEW  COMPARISON:  Portable chest x-ray 05/17/2014, 05/06/2014. Two-view chest x-ray 04/05/2014, 10/23/2013.  FINDINGS: Cardiac silhouette mildly enlarged but  stable. Thoracic aorta tortuous and mildly atherosclerotic, unchanged. Prominent central right pulmonary artery, unchanged. Lungs clear. Bronchovascular markings normal. Pulmonary vascularity normal. No visible pleural effusions. No pneumothorax.  IMPRESSION: Stable mild cardiomegaly.  No acute cardiopulmonary disease.   Electronically Signed   By: Evangeline Dakin M.D.   On: 06/17/2014 19:24    EKG: Independently reviewed.  Atrial flutter with RVR.  Assessment/Plan Principal Problem:   Atrial flutter with rapid ventricular response Active Problems:   HYPERTENSION   Chest discomfort   Diabetes mellitus   1. Atrial flutter with RVR - patient's heart rate has improved spontaneously to 100 per minute but still is in flutter. Patient takes metoprolol 25 mg by mouth twice a day which patient has not taken it today as patient was going for dialysis. One dose of metoprolol 25 mg has been ordered now and we will closely observe in telemetry and if rate is not controlled may start Cardizem. Continue Coumadin per pharmacy. Check TSH. 2. Chest pressure/discomfort - probably related to the rate. Cycle cardiac markers. Check 2-D echo. 3. ESRD on hemodialysis on Monday Wednesday and Friday - dialysis per nephrologist. Left message for nephrology. 4. Diabetes mellitus2 - continue home medications. 5. Hypertension - continue medications. 6. Hyperlipidemia - continue home medications.    Code Status:  Full code.  Family Communication:  None.  Disposition Plan:  Admit to inpatient.    KAKRAKANDY,ARSHAD N. [facility] Pager [phone].  If 7PM-7AM, please contact night-coverage [email or web address] Password [facility] 06/17/2014, 11:39 PM

## 2014-06-17 NOTE — ED Provider Notes (Signed)
CSN: [id number]     Arrival date & time 06/17/14  1842 History   First MD Initiated Contact with Patient 06/17/14 1909     Chief Complaint  Patient presents with  . Chest Pain  . Shortness of Breath     (Consider location/radiation/quality/duration/timing/severity/associated sxs/prior Treatment) Patient is a 63 y.o. male presenting with chest pain. The history is provided by the patient.  Chest Pain Chest pain location: central. Pain quality: tightness   Pain radiates to:  Does not radiate Pain severity:  Moderate Onset quality:  Sudden Timing:  Intermittent Progression:  Waxing and waning Chronicity: has had previously. Context comment:  Dialysis Relieved by:  Nothing Worsened by:  Exertion Associated symptoms: lower extremity edema (chronic. no change) and shortness of breath   Associated symptoms: no abdominal pain, no cough, no dizziness, no fever, no headache, no nausea and not vomiting     63 yo male pw chest pain and palpitations. Onset about 5 days ago while at dialysis Wednesday. Constant. Central chest tightness. Non radiating. Worse with exertion. No diaphoresis. Some sob. Constant as well. No fever or cough. Chronic edema unchanged. No h/o dvt/pe.  Has had dialysis on Friday and Monday (today). Went to [employer] office afterwards. EKG obtained and patient found to be tachycardic. Sent to ED.   Patient states it feels similar to about one month ago when he was at OSH. States they "shocked" me at that time.    Past Medical History  Diagnosis Date  . Paroxysmal atrial fibrillation 2/09    Abnormal stress nuclear study in 01/2008; normal coronary angiography  . Hyperlipidemia 2008  . Hypertension 1993    Mild to moderate LVH  . Obesity     sleep apnea treated with BiPAP  . Anemia   . Gastroesophageal reflux disease   . Hyperparathyroidism     Secondary  . Allergic rhinitis   . Lower gastrointestinal bleed 04/2008    diverticulosis; hemorrhoids; constipation  .  Diverticular disease   . Chronic back pain     Degenerative disc disease  . ESRD (end stage renal disease) on dialysis     Dialysis since 2007  . Renal insufficiency   . Sleep apnea 2010    CPAP followed at [place]  . Cataract     s/p extraction x 2  . Diabetes mellitus, type 2 1993    no insulin   Past Surgical History  Procedure Laterality Date  . Cataract extraction w/ intraocular lens  implant, bilateral  2008  . Lumbar peritoneal shunt    . Exploratory laparotomy      shunt complications  . Back surgery    . Cholecystectomy  1999  . Spine surgery      twice in lower back   Family History  Problem Relation Age of Onset  . Hypertension Mother   . Cancer Mother 7    breast   . Hypertension Sister 33  . Hypertension Daughter 28  . Diabetes Daughter    History  Substance Use Topics  . Smoking status: Former Smoker    Types: Cigarettes  . Smokeless tobacco: Never Used  . Alcohol Use: No    Review of Systems  Constitutional: Negative for fever and chills.  Respiratory: Positive for shortness of breath. Negative for cough.   Cardiovascular: Positive for chest pain and leg swelling.  Gastrointestinal: Negative for nausea, vomiting and abdominal pain.  Neurological: Negative for dizziness and headaches.  All other systems reviewed and are negative.  Allergies  Actos; Celebrex; and Penicillins  Home Medications   Prior to Admission medications   Medication Sig Start Date End Date Taking? Authorizing Jireh Vinas  acetaminophen (TYLENOL) 325 MG tablet Take 650 mg by mouth every 6 (six) hours as needed for moderate pain.   Yes Historical Bahja Bence, MD  albuterol (PROVENTIL HFA;VENTOLIN HFA) 108 (90 BASE) MCG/ACT inhaler Inhale 2 puffs into the lungs every 6 (six) hours as needed for wheezing. 10/19/12  Yes Alycia Rossetti, MD  albuterol (PROVENTIL) (2.5 MG/3ML) 0.083% nebulizer solution Take 3 mLs (2.5 mg total) by nebulization every 6 (six) hours as needed for  wheezing or shortness of breath. 10/30/13  Yes Fayrene Helper, MD  allopurinol (ZYLOPRIM) 100 MG tablet Take 100 mg by mouth daily.    Yes Historical Xerxes Agrusa, MD  B Complex-C-Folic Acid (RENAL MULTIVITAMIN FORMULA PO) Take 1 tablet by mouth daily.   Yes Historical Lenora Gomes, MD  cetirizine (ZYRTEC) 10 MG tablet Take 10 mg by mouth daily as needed for allergies.    Yes Historical Tresa Jolley, MD  cinacalcet (SENSIPAR) 90 MG tablet Take 90 mg by mouth daily.   Yes Historical Amarri Michaelson, MD  docusate sodium (COLACE) 100 MG capsule Take 200 mg by mouth 2 (two) times daily as needed for moderate constipation.    Yes Historical Marylouise Mallet, MD  fish oil-omega-3 fatty acids 1000 MG capsule Take 2 g by mouth 3 (three) times daily. SUPER OMEGA 3 FORMULA: EPA 300/DHA200   Yes Historical Momoka Stringfield, MD  gabapentin (NEURONTIN) 100 MG capsule Take 1 capsule (100 mg total) by mouth 2 (two) times daily. 01/29/14  Yes Fayrene Helper, MD  gemfibrozil (LOPID) 600 MG tablet Take 600 mg by mouth 2 (two) times daily.   Yes Historical Marbeth Smedley, MD  HYDROcodone-acetaminophen (NORCO/VICODIN) 5-325 MG per tablet Take 1 tablet by mouth 2 (two) times daily.   Yes Historical Capone Schwinn, MD  insulin glargine (LANTUS) 100 UNIT/ML injection Inject 22 Units into the skin at bedtime.    Yes Historical Lalani Winkles, MD  ipratropium (ATROVENT) 0.02 % nebulizer solution Take 2.5 mLs (0.5 mg total) by nebulization 4 (four) times daily. 10/30/13  Yes Fayrene Helper, MD  metoprolol tartrate (LOPRESSOR) 25 MG tablet Take 25 mg by mouth 2 (two) times daily.   Yes Historical Tiran Sauseda, MD  pantoprazole (PROTONIX) 40 MG tablet Take 40 mg by mouth daily.   Yes Historical Jilberto Vanderwall, MD  PRESCRIPTION MEDICATION Place 1 drop into both eyes 3 (three) times daily. For dry eyes   Yes Historical Delonte Musich, MD  sevelamer (RENVELA) 800 MG tablet Take 4,800 mg by mouth 3 (three) times daily with meals. Takes 6 tablets (4800 mg) three times daily with meals and 3  tablets (2400 mg) with snack.   Yes Historical Chardonay Scritchfield, MD  warfarin (COUMADIN) 5 MG tablet Take 5 mg by mouth every evening. Per Lenice Pressman Historical Nolyn Swab, MD   BP 123/64  Pulse 140  Temp(Src) 98.4 F (36.9 C) (Oral)  Resp 18  Ht 6\' 3"  (1.905 m)  Wt 252 lb (114.306 kg)  BMI 31.50 kg/m2  SpO2 93% Physical Exam  Nursing note and vitals reviewed. Constitutional: He is oriented to person, place, and time. He appears well-developed and well-nourished. No distress.  Sitting up in bed. NAD. Smiling. Speaks in full sentences.  HENT:  Head: Normocephalic and atraumatic.  Eyes: Conjunctivae are normal. Right eye exhibits no discharge. Left eye exhibits no discharge.  Neck: No tracheal deviation present.  Cardiovascular: Normal heart sounds  and intact distal pulses.   Tachycardic to 140's  Pulmonary/Chest: Effort normal and breath sounds normal. No stridor. No respiratory distress. He has no wheezes. He has no rales.  Abdominal: Soft. He exhibits no distension. There is no tenderness. There is no guarding.  Musculoskeletal: He exhibits edema (mild BLE). He exhibits no tenderness.  Neurological: He is alert and oriented to person, place, and time.  Skin: Skin is warm and dry.  Psychiatric: He has a normal mood and affect. His behavior is normal.    ED Course  Procedures (including critical care time) Labs Review Labs Reviewed  BASIC METABOLIC PANEL - Abnormal; Notable for the following:    Potassium 5.4 (*)    Chloride 94 (*)    Glucose, Bld 173 (*)    Creatinine, Ser 8.00 (*)    GFR calc non Af Amer 6 (*)    GFR calc Af Amer 7 (*)    All other components within normal limits  PRO B NATRIURETIC PEPTIDE - Abnormal; Notable for the following:    Pro B Natriuretic peptide (BNP) 2925.0 (*)    All other components within normal limits  PROTIME-INR - Abnormal; Notable for the following:    Prothrombin Time 22.7 (*)    INR 2.00 (*)    All other components within normal limits   CBC  POTASSIUM  I-STAT TROPOININ, ED    Imaging Review Dg Chest Port 1 View  06/17/2014   CLINICAL DATA:  Five day history of mid chest pain and shortness of breath. Atrial fibrillation. Current history of hypertension and diabetes.  EXAM: PORTABLE CHEST - 1 VIEW  COMPARISON:  Portable chest x-ray 05/17/2014, 05/06/2014. Two-view chest x-ray 04/05/2014, 10/23/2013.  FINDINGS: Cardiac silhouette mildly enlarged but stable. Thoracic aorta tortuous and mildly atherosclerotic, unchanged. Prominent central right pulmonary artery, unchanged. Lungs clear. Bronchovascular markings normal. Pulmonary vascularity normal. No visible pleural effusions. No pneumothorax.  IMPRESSION: Stable mild cardiomegaly.  No acute cardiopulmonary disease.   Electronically Signed   By: Evangeline Dakin M.D.   On: 06/17/2014 19:24     EKG Interpretation   Date/Time:  Monday June 17 2014 18:47:06 EDT Ventricular Rate:  142 PR Interval:  96 QRS Duration: 94 QT Interval:  334 QTC Calculation: 513 R Axis:   109 Text Interpretation:  Atrial flutter with 2 to 1 block Rightward axis  Nonspecific ST abnormality Abnormal ECG Confirmed by Kathrynn Humble, MD, Thelma Comp  [phone]) on 06/17/2014 8:39:47 PM      MDM   Final diagnoses:  Atrial flutter, unspecified    63 yo male with palpitations and mild chest discomfort.  Likely a. Flutter on initial EKG. Given dilt with improvement. EKG confirms atrial flutter.  Symptoms improved.  Troponin negative Doubt ACS. Do not suspect PE. Chronic edema unchanged. No h/o DVT. No leg pain.  Admit patient 2/2 atrial flutter  Labs and imaging reviewed by myself and considered in medical decision making if ordered. Imaging interpreted by radiology.   Discussed case with Dr. Kathrynn Humble who is in agreement with assessment and plan.    Bonnita Hollow, MD 06/18/14 0110

## 2014-06-18 ENCOUNTER — Encounter (HOSPITAL_COMMUNITY): Payer: Medicare Other | Admitting: Anesthesiology

## 2014-06-18 ENCOUNTER — Encounter (HOSPITAL_COMMUNITY)
Admission: EM | Disposition: A | Discharge status: Home / Self Care | Payer: Self-pay | Source: Home / Self Care | Attending: Internal Medicine

## 2014-06-18 ENCOUNTER — Inpatient Hospital Stay (HOSPITAL_COMMUNITY): Payer: Medicare Other | Admitting: Anesthesiology

## 2014-06-18 ENCOUNTER — Ambulatory Visit (HOSPITAL_COMMUNITY): Admit: 2014-06-18 | Payer: Self-pay | Admitting: Internal Medicine

## 2014-06-18 ENCOUNTER — Encounter (HOSPITAL_COMMUNITY): Payer: Self-pay | Admitting: *Deleted

## 2014-06-18 DIAGNOSIS — I4891 Unspecified atrial fibrillation: Secondary | ICD-10-CM

## 2014-06-18 DIAGNOSIS — G4733 Obstructive sleep apnea (adult) (pediatric): Secondary | ICD-10-CM

## 2014-06-18 DIAGNOSIS — I4892 Unspecified atrial flutter: Secondary | ICD-10-CM

## 2014-06-18 HISTORY — PX: ATRIAL FLUTTER ABLATION: SHX5733

## 2014-06-18 LAB — PROTIME-INR
INR: 1.98 — ABNORMAL HIGH (ref 0.00–1.49)
PROTHROMBIN TIME: 22.5 s — AB (ref 11.6–15.2)

## 2014-06-18 LAB — COMPREHENSIVE METABOLIC PANEL
ALT: 11 U/L (ref 0–53)
AST: 16 U/L (ref 0–37)
Albumin: 3.3 g/dL — ABNORMAL LOW (ref 3.5–5.2)
Alkaline Phosphatase: 100 U/L (ref 39–117)
BUN: 29 mg/dL — ABNORMAL HIGH (ref 6–23)
CHLORIDE: 96 meq/L (ref 96–112)
CO2: 25 meq/L (ref 19–32)
CREATININE: 9.02 mg/dL — AB (ref 0.50–1.35)
Calcium: 9 mg/dL (ref 8.4–10.5)
GFR calc Af Amer: 6 mL/min — ABNORMAL LOW (ref >90)
GFR, EST NON AFRICAN AMERICAN: 5 mL/min — AB (ref >90)
GLUCOSE: 118 mg/dL — AB (ref 70–99)
Potassium: 5.3 mEq/L (ref 3.7–5.3)
Sodium: 140 mEq/L (ref 137–147)
Total Bilirubin: 0.4 mg/dL (ref 0.3–1.2)
Total Protein: 6.8 g/dL (ref 6.0–8.3)

## 2014-06-18 LAB — GLUCOSE, CAPILLARY
GLUCOSE-CAPILLARY: 86 mg/dL (ref 70–99)
Glucose-Capillary: 149 mg/dL — ABNORMAL HIGH (ref 70–99)
Glucose-Capillary: 93 mg/dL (ref 70–99)
Glucose-Capillary: 118 mg/dL — ABNORMAL HIGH (ref 70–99)
Glucose-Capillary: 60 mg/dL — ABNORMAL LOW (ref 70–99)
Glucose-Capillary: 112 mg/dL — ABNORMAL HIGH (ref 70–99)
Glucose-Capillary: 72 mg/dL (ref 70–99)
Glucose-Capillary: 106 mg/dL — ABNORMAL HIGH (ref 70–99)

## 2014-06-18 LAB — CBC
HCT: 43.2 % (ref 39.0–52.0)
Hemoglobin: 13.7 g/dL (ref 13.0–17.0)
MCH: 30.9 pg (ref 26.0–34.0)
MCHC: 31.7 g/dL (ref 30.0–36.0)
MCV: 97.3 fL (ref 78.0–100.0)
PLATELETS: 192 10*3/uL (ref 150–400)
RBC: 4.44 MIL/uL (ref 4.22–5.81)
RDW: 14.1 % (ref 11.5–15.5)
WBC: 6.1 10*3/uL (ref 4.0–10.5)

## 2014-06-18 LAB — TROPONIN I
Troponin I: <0.3 ng/mL (ref <0.30)
Troponin I: <0.3 ng/mL (ref <0.30)
Troponin I: 2.58 ng/mL (ref <0.30)

## 2014-06-18 LAB — TSH: TSH: 2.04 u[IU]/mL (ref 0.350–4.500)

## 2014-06-18 SURGERY — ATRIAL FLUTTER ABLATION
Anesthesia: Monitor Anesthesia Care

## 2014-06-18 MED ORDER — ONDANSETRON HCL 4 MG PO TABS: 4.0000 mg | ORAL_TABLET | Freq: Four times a day (QID) | ORAL | Status: DC | PRN

## 2014-06-18 MED ORDER — SUCCINYLCHOLINE CHLORIDE 20 MG/ML IJ SOLN
INTRAMUSCULAR | Status: DC | PRN
  Administered 2014-06-18: 140 mg via INTRAVENOUS

## 2014-06-18 MED ORDER — WARFARIN - PHARMACIST DOSING INPATIENT: Freq: Every day | Status: DC

## 2014-06-18 MED ORDER — ALBUTEROL SULFATE HFA 108 (90 BASE) MCG/ACT IN AERS
2.0000 | INHALATION_SPRAY | Freq: Four times a day (QID) | RESPIRATORY_TRACT | Status: DC | PRN
Start: 2014-06-18 — End: 2014-06-18

## 2014-06-18 MED ORDER — INSULIN GLARGINE 100 UNIT/ML ~~LOC~~ SOLN
22.0000 [IU] | Freq: Every day | SUBCUTANEOUS | Status: DC
  Administered 2014-06-18 (×2): 22 [IU] via SUBCUTANEOUS
  Filled 2014-06-18 (×3): qty 0.22

## 2014-06-18 MED ORDER — INSULIN ASPART 100 UNIT/ML ~~LOC~~ SOLN
0.0000 [IU] | Freq: Three times a day (TID) | SUBCUTANEOUS | Status: DC
  Administered 2014-06-19: 2 [IU] via SUBCUTANEOUS

## 2014-06-18 MED ORDER — ACETAMINOPHEN 325 MG PO TABS: 650.0000 mg | ORAL_TABLET | ORAL | Status: DC | PRN

## 2014-06-18 MED ORDER — DOXERCALCIFEROL 4 MCG/2ML IV SOLN
7.0000 ug | INTRAVENOUS | Status: DC
  Administered 2014-06-19: 7 ug via INTRAVENOUS
  Filled 2014-06-18: qty 4

## 2014-06-18 MED ORDER — ALBUTEROL SULFATE (2.5 MG/3ML) 0.083% IN NEBU: 2.5000 mg | INHALATION_SOLUTION | Freq: Four times a day (QID) | RESPIRATORY_TRACT | Status: DC | PRN

## 2014-06-18 MED ORDER — ACETAMINOPHEN 650 MG RE SUPP: 650.0000 mg | Freq: Four times a day (QID) | RECTAL | Status: DC | PRN

## 2014-06-18 MED ORDER — ONDANSETRON HCL 4 MG/2ML IJ SOLN: 4.0000 mg | Freq: Four times a day (QID) | INTRAMUSCULAR | Status: DC | PRN

## 2014-06-18 MED ORDER — ACETAMINOPHEN 325 MG PO TABS
650.0000 mg | ORAL_TABLET | Freq: Four times a day (QID) | ORAL | Status: DC | PRN
Start: 2014-06-18 — End: 2014-06-18

## 2014-06-18 MED ORDER — LORATADINE 10 MG PO TABS
10.0000 mg | ORAL_TABLET | Freq: Every day | ORAL | Status: DC
  Administered 2014-06-18 – 2014-06-19 (×2): 10 mg via ORAL
  Filled 2014-06-18 (×2): qty 1

## 2014-06-18 MED ORDER — SODIUM CHLORIDE 0.9 % IV SOLN
INTRAVENOUS | Status: DC | PRN
  Administered 2014-06-18 (×2): via INTRAVENOUS

## 2014-06-18 MED ORDER — SEVELAMER CARBONATE 800 MG PO TABS
2400.0000 mg | ORAL_TABLET | ORAL | Status: DC | PRN
  Filled 2014-06-18: qty 3

## 2014-06-18 MED ORDER — METOPROLOL TARTRATE 25 MG PO TABS
25.0000 mg | ORAL_TABLET | Freq: Two times a day (BID) | ORAL | Status: DC
  Administered 2014-06-18: 25 mg via ORAL
  Filled 2014-06-18 (×3): qty 1

## 2014-06-18 MED ORDER — SODIUM CHLORIDE 0.9 % IJ SOLN
3.0000 mL | Freq: Two times a day (BID) | INTRAMUSCULAR | Status: DC
  Administered 2014-06-18: 3 mL via INTRAVENOUS

## 2014-06-18 MED ORDER — ENOXAPARIN SODIUM 30 MG/0.3ML ~~LOC~~ SOLN
30.0000 mg | Freq: Every day | SUBCUTANEOUS | Status: DC
Start: 2014-06-18 — End: 2014-06-18
  Filled 2014-06-18: qty 0.3

## 2014-06-18 MED ORDER — BUPIVACAINE HCL (PF) 0.25 % IJ SOLN
INTRAMUSCULAR | Status: AC
  Filled 2014-06-18: qty 30

## 2014-06-18 MED ORDER — PROPOFOL 10 MG/ML IV BOLUS
INTRAVENOUS | Status: DC | PRN
  Administered 2014-06-18: 200 mg via INTRAVENOUS

## 2014-06-18 MED ORDER — FENTANYL CITRATE 0.05 MG/ML IJ SOLN
INTRAMUSCULAR | Status: DC | PRN
  Administered 2014-06-18: 100 ug via INTRAVENOUS
  Administered 2014-06-18: 50 ug via INTRAVENOUS

## 2014-06-18 MED ORDER — MIDAZOLAM HCL 5 MG/5ML IJ SOLN
INTRAMUSCULAR | Status: DC | PRN
  Administered 2014-06-18: 1 mg via INTRAVENOUS

## 2014-06-18 MED ORDER — SODIUM CHLORIDE 0.9 % IJ SOLN
3.0000 mL | Freq: Two times a day (BID) | INTRAMUSCULAR | Status: DC
Start: 2014-06-18 — End: 2014-06-19
  Administered 2014-06-18 – 2014-06-19 (×2): 3 mL via INTRAVENOUS

## 2014-06-18 MED ORDER — DOCUSATE SODIUM 100 MG PO CAPS: 200.0000 mg | ORAL_CAPSULE | Freq: Two times a day (BID) | ORAL | Status: DC | PRN

## 2014-06-18 MED ORDER — HYDROCODONE-ACETAMINOPHEN 5-325 MG PO TABS
1.0000 | ORAL_TABLET | Freq: Two times a day (BID) | ORAL | Status: DC
  Administered 2014-06-18 (×2): 1 via ORAL
  Filled 2014-06-18 (×2): qty 1

## 2014-06-18 MED ORDER — GABAPENTIN 100 MG PO CAPS
100.0000 mg | ORAL_CAPSULE | Freq: Two times a day (BID) | ORAL | Status: DC
  Administered 2014-06-18 – 2014-06-19 (×4): 100 mg via ORAL
  Filled 2014-06-18 (×5): qty 1

## 2014-06-18 MED ORDER — WARFARIN SODIUM 6 MG PO TABS
6.0000 mg | ORAL_TABLET | Freq: Once | ORAL | Status: AC
  Administered 2014-06-18: 6 mg via ORAL
  Filled 2014-06-18: qty 1

## 2014-06-18 MED ORDER — ONDANSETRON HCL 4 MG/2ML IJ SOLN
INTRAMUSCULAR | Status: DC | PRN
  Administered 2014-06-18: 4 mg via INTRAVENOUS

## 2014-06-18 MED ORDER — PANTOPRAZOLE SODIUM 40 MG PO TBEC
40.0000 mg | DELAYED_RELEASE_TABLET | Freq: Every day | ORAL | Status: DC
  Administered 2014-06-18 – 2014-06-19 (×2): 40 mg via ORAL
  Filled 2014-06-18 (×2): qty 1

## 2014-06-18 MED ORDER — SODIUM CHLORIDE 0.9 % IV SOLN
250.0000 mL | INTRAVENOUS | Status: DC | PRN
Start: 2014-06-18 — End: 2014-06-19

## 2014-06-18 MED ORDER — LIDOCAINE HCL (CARDIAC) 20 MG/ML IV SOLN
INTRAVENOUS | Status: DC | PRN
  Administered 2014-06-18: 100 mg via INTRAVENOUS

## 2014-06-18 MED ORDER — ALBUMIN HUMAN 5 % IV SOLN
INTRAVENOUS | Status: DC | PRN
  Administered 2014-06-18: 13:00:00 via INTRAVENOUS

## 2014-06-18 MED ORDER — ARTIFICIAL TEARS OP OINT
TOPICAL_OINTMENT | OPHTHALMIC | Status: DC | PRN
  Administered 2014-06-18: 1 via OPHTHALMIC

## 2014-06-18 MED ORDER — DEXTROSE 50 % IV SOLN
INTRAVENOUS | Status: DC | PRN
  Administered 2014-06-18: 25 g via INTRAVENOUS

## 2014-06-18 MED ORDER — SEVELAMER CARBONATE 800 MG PO TABS
4800.0000 mg | ORAL_TABLET | Freq: Three times a day (TID) | ORAL | Status: DC
  Administered 2014-06-18 – 2014-06-19 (×3): 4800 mg via ORAL
  Filled 2014-06-18 (×7): qty 6

## 2014-06-18 MED ORDER — SODIUM CHLORIDE 0.9 % IJ SOLN: 3.0000 mL | Freq: Two times a day (BID) | INTRAMUSCULAR | Status: DC

## 2014-06-18 MED ORDER — OMEGA-3-ACID ETHYL ESTERS 1 G PO CAPS
2.0000 g | ORAL_CAPSULE | Freq: Three times a day (TID) | ORAL | Status: DC
  Administered 2014-06-18 – 2014-06-19 (×4): 2 g via ORAL
  Filled 2014-06-18 (×7): qty 2

## 2014-06-18 MED ORDER — DEXTROSE 50 % IV SOLN
INTRAVENOUS | Status: AC
  Filled 2014-06-18: qty 50

## 2014-06-18 MED ORDER — CINACALCET HCL 30 MG PO TABS
90.0000 mg | ORAL_TABLET | Freq: Every day | ORAL | Status: DC
  Administered 2014-06-18 – 2014-06-19 (×2): 90 mg via ORAL
  Filled 2014-06-18 (×3): qty 3

## 2014-06-18 MED ORDER — SODIUM CHLORIDE 0.9 % IJ SOLN: 3.0000 mL | INTRAMUSCULAR | Status: DC | PRN

## 2014-06-18 MED ORDER — ALLOPURINOL 100 MG PO TABS
100.0000 mg | ORAL_TABLET | Freq: Every day | ORAL | Status: DC
  Administered 2014-06-18 – 2014-06-19 (×2): 100 mg via ORAL
  Filled 2014-06-18 (×2): qty 1

## 2014-06-18 MED ORDER — RENA-VITE PO TABS
1.0000 | ORAL_TABLET | Freq: Every day | ORAL | Status: DC
  Administered 2014-06-18: 1 via ORAL
  Filled 2014-06-18 (×2): qty 1

## 2014-06-18 MED ORDER — PHENYLEPHRINE HCL 10 MG/ML IJ SOLN
INTRAMUSCULAR | Status: DC | PRN
  Administered 2014-06-18 (×2): 80 ug via INTRAVENOUS

## 2014-06-18 MED ORDER — GEMFIBROZIL 600 MG PO TABS
600.0000 mg | ORAL_TABLET | Freq: Two times a day (BID) | ORAL | Status: DC
  Administered 2014-06-18 – 2014-06-19 (×2): 600 mg via ORAL
  Filled 2014-06-18 (×5): qty 1

## 2014-06-18 NOTE — Progress Notes (Signed)
Chart reviewed. Patient not in room. Getting procedure.     [facility] PROGRESS NOTE  Albert Lewis OTL:[id number] DOB: [birth year] DOA: 06/17/2014 PCP: Tula Nakayama, MD  Assessment/Plan:  Principal Problem:   Atrial flutter with rapid ventricular response Active Problems:   HYPERTENSION   Chest discomfort   Diabetes mellitus  ESRD  Will return to examine  Code Status:  full Family Communication:   Disposition Plan:    Consultants:  Nephrology  EP  Procedures:   TEE  Antibiotics:    Objective: Filed Vitals:   06/18/14 0532  BP: 144/82  Pulse: 68  Temp: 98.7 F (37.1 C)  Resp: 18    Intake/Output Summary (Last 24 hours) at 06/18/14 1343 Last data filed at 06/18/14 1330  Gross per 24 hour  Intake    500 ml  Output      0 ml  Net    500 ml   Filed Weights   06/17/14 1851 06/18/14 0121  Weight: 114.306 kg (252 lb) 122.698 kg (270 lb 8 oz)    Basic Metabolic Panel:  Recent Labs Lab 06/17/14 1940 06/17/14 2314 06/18/14 0340  NA 141  --  140  K 5.4* 5.2 5.3  CL 94*  --  96  CO2 27  --  25  GLUCOSE 173*  --  118*  BUN 22  --  29*  CREATININE 8.00*  --  9.02*  CALCIUM 8.9  --  9.0   Liver Function Tests:  Recent Labs Lab 06/18/14 0340  AST 16  ALT 11  ALKPHOS 100  BILITOT 0.4  PROT 6.8  ALBUMIN 3.3*   No results found for this basename: LIPASE, AMYLASE,  in the last 168 hours No results found for this basename: AMMONIA,  in the last 168 hours CBC:  Recent Labs Lab 06/17/14 1940 06/18/14 0340  WBC 6.4 6.1  HGB 14.1 13.7  HCT 43.3 43.2  MCV 96.0 97.3  PLT 222 192   Cardiac Enzymes:  Recent Labs Lab 06/18/14 0340 06/18/14 0835  TROPONINI <0.30 <0.30   BNP (last 3 results)  Recent Labs  06/17/14 1940  PROBNP 2925.0*   CBG:  Recent Labs Lab 06/18/14 0234 06/18/14 0639 06/18/14 0950  GLUCAP 149* 86 93    No results found for this or any previous visit (from the past 240 hour(s)).    Studies: Dg Chest Port 1 View  06/17/2014   CLINICAL DATA:  Five day history of mid chest pain and shortness of breath. Atrial fibrillation. Current history of hypertension and diabetes.  EXAM: PORTABLE CHEST - 1 VIEW  COMPARISON:  Portable chest x-ray 05/17/2014, 05/06/2014. Two-view chest x-ray 04/05/2014, 10/23/2013.  FINDINGS: Cardiac silhouette mildly enlarged but stable. Thoracic aorta tortuous and mildly atherosclerotic, unchanged. Prominent central right pulmonary artery, unchanged. Lungs clear. Bronchovascular markings normal. Pulmonary vascularity normal. No visible pleural effusions. No pneumothorax.  IMPRESSION: Stable mild cardiomegaly.  No acute cardiopulmonary disease.   Electronically Signed   By: Evangeline Dakin M.D.   On: 06/17/2014 19:24    Scheduled Meds: . allopurinol  100 mg Oral Daily  . cinacalcet  90 mg Oral Q breakfast  . [START ON 06/19/2014] doxercalciferol  7 mcg Intravenous Q M,W,F-HD  . gabapentin  100 mg Oral BID  . gemfibrozil  600 mg Oral BID AC  . HYDROcodone-acetaminophen  1 tablet Oral BID  . insulin aspart  0-9 Units Subcutaneous TID WC  . insulin glargine  22 Units Subcutaneous QHS  . loratadine  10 mg Oral Daily  . metoprolol tartrate  25 mg Oral BID  . multivitamin  1 tablet Oral QHS  . omega-3 acid ethyl esters  2 g Oral TID WC  . pantoprazole  40 mg Oral Daily  . sevelamer carbonate  4,800 mg Oral TID WC  . sodium chloride  3 mL Intravenous Q12H  . sodium chloride  3 mL Intravenous Q12H  . warfarin  6 mg Oral ONCE-1800  . Warfarin - Pharmacist Dosing Inpatient   Does not apply q1800   Continuous Infusions:   [facility] Pager [phone]. If 7PM-7AM, please contact night-coverage at [email or web address], password [facility] 06/18/2014, 1:43 PM  LOS: 1 day

## 2014-06-18 NOTE — Progress Notes (Signed)
  Echocardiogram Echocardiogram Transesophageal has been performed.  CHUNG, [place] 06/18/2014, 1:21 PM

## 2014-06-18 NOTE — Consult Note (Addendum)
ELECTROPHYSIOLOGY CONSULT NOTE    Patient ID: Albert Lewis MRN: [id number], DOB/AGE: 01-25-1951 63 y.o.  Admit date: 06/17/2014 Date of Consult: 06-18-14  Primary Physician: Tula Nakayama, MD Primary Cardiologist: Bronson Ing  Reason for Consultation: atrial flutter  HPI:  Albert Lewis is a 63 y.o. male with a past medical history significant for ESRD (on dialysis), hypertension, hyperlipidemia, sleep apnea (on CPAP), diabetes, and atrial flutter.  His past medical history notes atrial fibrillation, but I can find no documentation of this in the chart.  He was first diagnosed with atrial flutter in November of 2014. He was initiated on Warfarin and Diltiazem for rate control. He underwent cardioversion in May of this year, but returned to atrial flutter within 1 week.  He is symptomatic with chest tightness and palpitations.  He also has chronic lower extremity edema and shortness of breath with exertion.  He was advised to come to [facility] yesterday after being seen in the office and he was found to be in flutter with RVR.   Last echo 04-2014 demonstrated EF 55-60%, LA 43, no significant valvular abnormalities.  Lab work is notable for INR of 1.98, normal TSH, negative cardiac enzymes, renal failure.  EP has been asked to evaluate for treatment options.  ROS is negative except as outlined above.   Past Medical History  Diagnosis Date  . Paroxysmal atrial fibrillation 2/09    Abnormal stress nuclear study in 01/2008; normal coronary angiography  . Hyperlipidemia 2008  . Hypertension 1993    Mild to moderate LVH  . Obesity     sleep apnea treated with BiPAP  . Anemia   . Gastroesophageal reflux disease   . Hyperparathyroidism     Secondary  . Allergic rhinitis   . Lower gastrointestinal bleed 04/2008    diverticulosis; hemorrhoids; constipation  . Diverticular disease   . Chronic back pain     Degenerative disc disease  . ESRD (end stage renal disease) on dialysis      Dialysis since 2007  . Renal insufficiency   . Sleep apnea 2010    CPAP followed at [place]  . Cataract     s/p extraction x 2  . Diabetes mellitus, type 2 1993    no insulin     Surgical History:  Past Surgical History  Procedure Laterality Date  . Cataract extraction w/ intraocular lens  implant, bilateral  2008  . Lumbar peritoneal shunt    . Exploratory laparotomy      shunt complications  . Back surgery    . Cholecystectomy  1999  . Spine surgery      twice in lower back     Prescriptions prior to admission  Medication Sig Dispense Refill  . acetaminophen (TYLENOL) 325 MG tablet Take 650 mg by mouth every 6 (six) hours as needed for moderate pain.      Marland Kitchen albuterol (PROVENTIL HFA;VENTOLIN HFA) 108 (90 BASE) MCG/ACT inhaler Inhale 2 puffs into the lungs every 6 (six) hours as needed for wheezing.  1 Inhaler  2  . albuterol (PROVENTIL) (2.5 MG/3ML) 0.083% nebulizer solution Take 3 mLs (2.5 mg total) by nebulization every 6 (six) hours as needed for wheezing or shortness of breath.  75 mL  0  . allopurinol (ZYLOPRIM) 100 MG tablet Take 100 mg by mouth daily.       . B Complex-C-Folic Acid (RENAL MULTIVITAMIN FORMULA PO) Take 1 tablet by mouth daily.      . cetirizine (ZYRTEC) 10  MG tablet Take 10 mg by mouth daily as needed for allergies.       . cinacalcet (SENSIPAR) 90 MG tablet Take 90 mg by mouth daily.      Marland Kitchen docusate sodium (COLACE) 100 MG capsule Take 200 mg by mouth 2 (two) times daily as needed for moderate constipation.       . fish oil-omega-3 fatty acids 1000 MG capsule Take 2 g by mouth 3 (three) times daily. SUPER OMEGA 3 FORMULA: EPA 300/DHA200      . gabapentin (NEURONTIN) 100 MG capsule Take 1 capsule (100 mg total) by mouth 2 (two) times daily.  60 capsule  3  . gemfibrozil (LOPID) 600 MG tablet Take 600 mg by mouth 2 (two) times daily.      Marland Kitchen HYDROcodone-acetaminophen (NORCO/VICODIN) 5-325 MG per tablet Take 1 tablet by mouth 2 (two) times daily.      .  insulin glargine (LANTUS) 100 UNIT/ML injection Inject 22 Units into the skin at bedtime.       Marland Kitchen ipratropium (ATROVENT) 0.02 % nebulizer solution Take 2.5 mLs (0.5 mg total) by nebulization 4 (four) times daily.  75 mL  0  . metoprolol tartrate (LOPRESSOR) 25 MG tablet Take 25 mg by mouth 2 (two) times daily.      . pantoprazole (PROTONIX) 40 MG tablet Take 40 mg by mouth daily.      Marland Kitchen PRESCRIPTION MEDICATION Place 1 drop into both eyes 3 (three) times daily. For dry eyes      . sevelamer (RENVELA) 800 MG tablet Take 4,800 mg by mouth 3 (three) times daily with meals. Takes 6 tablets (4800 mg) three times daily with meals and 3 tablets (2400 mg) with snack.      . warfarin (COUMADIN) 5 MG tablet Take 5 mg by mouth every evening. Per Lattie Haw        Inpatient Medications:  . allopurinol  100 mg Oral Daily  . cinacalcet  90 mg Oral Q breakfast  . enoxaparin (LOVENOX) injection  30 mg Subcutaneous Daily  . gabapentin  100 mg Oral BID  . gemfibrozil  600 mg Oral BID AC  . HYDROcodone-acetaminophen  1 tablet Oral BID  . insulin aspart  0-9 Units Subcutaneous TID WC  . insulin glargine  22 Units Subcutaneous QHS  . loratadine  10 mg Oral Daily  . metoprolol tartrate  25 mg Oral BID  . omega-3 acid ethyl esters  2 g Oral TID WC  . pantoprazole  40 mg Oral Daily  . sevelamer carbonate  4,800 mg Oral TID WC  . sodium chloride  3 mL Intravenous Q12H  . sodium chloride  3 mL Intravenous Q12H  . Warfarin - Pharmacist Dosing Inpatient   Does not apply q1800    Allergies:  Allergies  Allergen Reactions  . Actos [Pioglitazone]   . Celebrex [Celecoxib]   . Penicillins Other (See Comments)    Couldn't walk.     History   Social History  . Marital Status: Divorced    Spouse Name: N/A    Number of Children: 2  . Years of Education: N/A   Occupational History  . Not on file.   Social History Main Topics  . Smoking status: Former Smoker    Types: Cigarettes  . Smokeless tobacco: Never Used   . Alcohol Use: No  . Drug Use: No  . Sexual Activity: Yes   Other Topics Concern  . Not on file   Social History Narrative  Married lives with spouse since 79. Patient has 11 siblings .     Family History  Problem Relation Age of Onset  . Hypertension Mother   . Cancer Mother 36    breast   . Hypertension Sister 71  . Hypertension Daughter 37  . Diabetes Daughter     BP 144/82  Pulse 68  Temp(Src) 98.7 F (37.1 C) (Oral)  Resp 18  Ht 6\' 3"  (1.905 m)  Wt 270 lb 8 oz (122.698 kg)  BMI 33.81 kg/m2  SpO2 95% Physical Exam: Filed Vitals:   06/18/14 0045 06/18/14 0100 06/18/14 0121 06/18/14 0532  BP: 132/61 130/57 160/76 144/82  Pulse: 68 69 69 68  Temp:   98 F (36.7 C) 98.7 F (37.1 C)  TempSrc:    Oral  Resp: 14 12 15 18   Height:      Weight:   270 lb 8 oz (122.698 kg)   SpO2: 92% 92% 94% 95%    GEN- The patient is overweight and chronically ill appearing, pleasant, alert and oriented x 3 today.   Head- normocephalic, atraumatic Eyes-  Sclera clear, conjunctiva pink Ears- hearing intact Oropharynx- clear Neck- supple Lungs- Clear to ausculation bilaterally, normal work of breathing Heart- irregular rate and rhythm  GI- soft, NT, ND, + BS Extremities- no clubbing, cyanosis, +1 edema MS- no significant deformity or atrophy Skin- no rash or lesion Psych- euthymic mood, full affect Neuro- strength and sensation are intact   Labs:   Lab Results  Component Value Date   WBC 6.1 06/18/2014   HGB 13.7 06/18/2014   HCT 43.2 06/18/2014   MCV 97.3 06/18/2014   PLT 192 06/18/2014    Recent Labs Lab 06/18/14 0340  NA 140  K 5.3  CL 96  CO2 25  BUN 29*  CREATININE 9.02*  CALCIUM 9.0  PROT 6.8  BILITOT 0.4  ALKPHOS 100  ALT 11  AST 16  GLUCOSE 118*     Radiology/Studies: Dg Chest Port 1 View 06/17/2014   CLINICAL DATA:  Five day history of mid chest pain and shortness of breath. Atrial fibrillation. Current history of hypertension and diabetes.   EXAM: PORTABLE CHEST - 1 VIEW  COMPARISON:  Portable chest x-ray 05/17/2014, 05/06/2014. Two-view chest x-ray 04/05/2014, 10/23/2013.  FINDINGS: Cardiac silhouette mildly enlarged but stable. Thoracic aorta tortuous and mildly atherosclerotic, unchanged. Prominent central right pulmonary artery, unchanged. Lungs clear. Bronchovascular markings normal. Pulmonary vascularity normal. No visible pleural effusions. No pneumothorax.  IMPRESSION: Stable mild cardiomegaly.  No acute cardiopulmonary disease.   Electronically Signed   By: Evangeline Dakin M.D.   On: 06/17/2014 19:24   EKG: likely typical atrial flutter with variable AV conduction  TELEMETRY: atrial flutter with controlled ventricular response  Assessment and Plan:  1. Typical appearing atrial flutter The patient has recurrent symptomatic typical appearing atrial flutter.  He is referred by Dr Bronson Ing for catheter ablation.  Though he has afib in his problem list, I do not see this documented.  Given obesity, renal disease, and sleep apnea, he is at risk for atrial arrhythmias in the future. Therapeutic strategies for atrial flutter including medicine and ablation were discussed in detail with the patient today. Risk, benefits, and alternatives to EP study and radiofrequency ablation were also discussed in detail today. These risks include but are not limited to stroke, bleeding, vascular damage, tamponade, perforation, damage to the heart and other structures, AV block requiring pacemaker, and death. The patient understands these risk and wishes to  proceed.  We will therefore proceed with catheter ablation at the next available time.  He will require TEE prior to the procedure. Continue coumadin- goal INR 2-3  2. OSA Compliance with CPAP encouraged  3. ESRD S/p HD yesterday per patient

## 2014-06-18 NOTE — CV Procedure (Signed)
     Transesophageal Echocardiogram Note  Albert Lewis [id number] [birth year]  Procedure: Transesophageal Echocardiogram Indications: a-fib  Procedure Details Consent: Obtained Time Out: Verified patient identification, verified procedure, site/side was marked, verified correct patient position, special equipment/implants available, Radiology Safety Procedures followed,  medications/allergies/relevent history reviewed, required imaging and test results available.  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  Dorothy Spark, MD, [facility] 06/18/2014, 12:26 PM

## 2014-06-18 NOTE — H&P (View-Only) (Signed)
ELECTROPHYSIOLOGY CONSULT NOTE    Patient ID: Albert Lewis MRN: [id number], DOB/AGE: 1951/08/03 63 y.o.  Admit date: 06/17/2014 Date of Consult: 06-18-14  Primary Physician: Tula Nakayama, MD Primary Cardiologist: Bronson Ing  Reason for Consultation: atrial flutter  HPI:  Albert Lewis is a 63 y.o. male with a past medical history significant for ESRD (on dialysis), hypertension, hyperlipidemia, sleep apnea (on CPAP), diabetes, and atrial flutter.  His past medical history notes atrial fibrillation, but I can find no documentation of this in the chart.  He was first diagnosed with atrial flutter in November of 2014. He was initiated on Warfarin and Diltiazem for rate control. He underwent cardioversion in May of this year, but returned to atrial flutter within 1 week.  He is symptomatic with chest tightness and palpitations.  He also has chronic lower extremity edema and shortness of breath with exertion.  He was advised to come to [facility] yesterday after being seen in the office and he was found to be in flutter with RVR.   Last echo 04-2014 demonstrated EF 55-60%, LA 43, no significant valvular abnormalities.  Lab work is notable for INR of 1.98, normal TSH, negative cardiac enzymes, renal failure.  EP has been asked to evaluate for treatment options.  ROS is negative except as outlined above.   Past Medical History  Diagnosis Date  . Paroxysmal atrial fibrillation 2/09    Abnormal stress nuclear study in 01/2008; normal coronary angiography  . Hyperlipidemia 2008  . Hypertension 1993    Mild to moderate LVH  . Obesity     sleep apnea treated with BiPAP  . Anemia   . Gastroesophageal reflux disease   . Hyperparathyroidism     Secondary  . Allergic rhinitis   . Lower gastrointestinal bleed 04/2008    diverticulosis; hemorrhoids; constipation  . Diverticular disease   . Chronic back pain     Degenerative disc disease  . ESRD (end stage renal disease) on dialysis      Dialysis since 2007  . Renal insufficiency   . Sleep apnea 2010    CPAP followed at [place]  . Cataract     s/p extraction x 2  . Diabetes mellitus, type 2 1993    no insulin     Surgical History:  Past Surgical History  Procedure Laterality Date  . Cataract extraction w/ intraocular lens  implant, bilateral  2008  . Lumbar peritoneal shunt    . Exploratory laparotomy      shunt complications  . Back surgery    . Cholecystectomy  1999  . Spine surgery      twice in lower back     Prescriptions prior to admission  Medication Sig Dispense Refill  . acetaminophen (TYLENOL) 325 MG tablet Take 650 mg by mouth every 6 (six) hours as needed for moderate pain.      Marland Kitchen albuterol (PROVENTIL HFA;VENTOLIN HFA) 108 (90 BASE) MCG/ACT inhaler Inhale 2 puffs into the lungs every 6 (six) hours as needed for wheezing.  1 Inhaler  2  . albuterol (PROVENTIL) (2.5 MG/3ML) 0.083% nebulizer solution Take 3 mLs (2.5 mg total) by nebulization every 6 (six) hours as needed for wheezing or shortness of breath.  75 mL  0  . allopurinol (ZYLOPRIM) 100 MG tablet Take 100 mg by mouth daily.       . B Complex-C-Folic Acid (RENAL MULTIVITAMIN FORMULA PO) Take 1 tablet by mouth daily.      . cetirizine (ZYRTEC) 10  MG tablet Take 10 mg by mouth daily as needed for allergies.       . cinacalcet (SENSIPAR) 90 MG tablet Take 90 mg by mouth daily.      Marland Kitchen docusate sodium (COLACE) 100 MG capsule Take 200 mg by mouth 2 (two) times daily as needed for moderate constipation.       . fish oil-omega-3 fatty acids 1000 MG capsule Take 2 g by mouth 3 (three) times daily. SUPER OMEGA 3 FORMULA: EPA 300/DHA200      . gabapentin (NEURONTIN) 100 MG capsule Take 1 capsule (100 mg total) by mouth 2 (two) times daily.  60 capsule  3  . gemfibrozil (LOPID) 600 MG tablet Take 600 mg by mouth 2 (two) times daily.      Marland Kitchen HYDROcodone-acetaminophen (NORCO/VICODIN) 5-325 MG per tablet Take 1 tablet by mouth 2 (two) times daily.      .  insulin glargine (LANTUS) 100 UNIT/ML injection Inject 22 Units into the skin at bedtime.       Marland Kitchen ipratropium (ATROVENT) 0.02 % nebulizer solution Take 2.5 mLs (0.5 mg total) by nebulization 4 (four) times daily.  75 mL  0  . metoprolol tartrate (LOPRESSOR) 25 MG tablet Take 25 mg by mouth 2 (two) times daily.      . pantoprazole (PROTONIX) 40 MG tablet Take 40 mg by mouth daily.      Marland Kitchen PRESCRIPTION MEDICATION Place 1 drop into both eyes 3 (three) times daily. For dry eyes      . sevelamer (RENVELA) 800 MG tablet Take 4,800 mg by mouth 3 (three) times daily with meals. Takes 6 tablets (4800 mg) three times daily with meals and 3 tablets (2400 mg) with snack.      . warfarin (COUMADIN) 5 MG tablet Take 5 mg by mouth every evening. Per Lattie Haw        Inpatient Medications:  . allopurinol  100 mg Oral Daily  . cinacalcet  90 mg Oral Q breakfast  . enoxaparin (LOVENOX) injection  30 mg Subcutaneous Daily  . gabapentin  100 mg Oral BID  . gemfibrozil  600 mg Oral BID AC  . HYDROcodone-acetaminophen  1 tablet Oral BID  . insulin aspart  0-9 Units Subcutaneous TID WC  . insulin glargine  22 Units Subcutaneous QHS  . loratadine  10 mg Oral Daily  . metoprolol tartrate  25 mg Oral BID  . omega-3 acid ethyl esters  2 g Oral TID WC  . pantoprazole  40 mg Oral Daily  . sevelamer carbonate  4,800 mg Oral TID WC  . sodium chloride  3 mL Intravenous Q12H  . sodium chloride  3 mL Intravenous Q12H  . Warfarin - Pharmacist Dosing Inpatient   Does not apply q1800    Allergies:  Allergies  Allergen Reactions  . Actos [Pioglitazone]   . Celebrex [Celecoxib]   . Penicillins Other (See Comments)    Couldn't walk.     History   Social History  . Marital Status: Divorced    Spouse Name: N/A    Number of Children: 2  . Years of Education: N/A   Occupational History  . Not on file.   Social History Main Topics  . Smoking status: Former Smoker    Types: Cigarettes  . Smokeless tobacco: Never Used   . Alcohol Use: No  . Drug Use: No  . Sexual Activity: Yes   Other Topics Concern  . Not on file   Social History Narrative  Married lives with spouse since 6. Patient has 11 siblings .     Family History  Problem Relation Age of Onset  . Hypertension Mother   . Cancer Mother 21    breast   . Hypertension Sister 55  . Hypertension Daughter 51  . Diabetes Daughter     BP 144/82  Pulse 68  Temp(Src) 98.7 F (37.1 C) (Oral)  Resp 18  Ht 6\' 3"  (1.905 m)  Wt 270 lb 8 oz (122.698 kg)  BMI 33.81 kg/m2  SpO2 95% Physical Exam: Filed Vitals:   06/18/14 0045 06/18/14 0100 06/18/14 0121 06/18/14 0532  BP: 132/61 130/57 160/76 144/82  Pulse: 68 69 69 68  Temp:   98 F (36.7 C) 98.7 F (37.1 C)  TempSrc:    Oral  Resp: 14 12 15 18   Height:      Weight:   270 lb 8 oz (122.698 kg)   SpO2: 92% 92% 94% 95%    GEN- The patient is overweight and chronically ill appearing, pleasant, alert and oriented x 3 today.   Head- normocephalic, atraumatic Eyes-  Sclera clear, conjunctiva pink Ears- hearing intact Oropharynx- clear Neck- supple Lungs- Clear to ausculation bilaterally, normal work of breathing Heart- irregular rate and rhythm  GI- soft, NT, ND, + BS Extremities- no clubbing, cyanosis, +1 edema MS- no significant deformity or atrophy Skin- no rash or lesion Psych- euthymic mood, full affect Neuro- strength and sensation are intact   Labs:   Lab Results  Component Value Date   WBC 6.1 06/18/2014   HGB 13.7 06/18/2014   HCT 43.2 06/18/2014   MCV 97.3 06/18/2014   PLT 192 06/18/2014    Recent Labs Lab 06/18/14 0340  NA 140  K 5.3  CL 96  CO2 25  BUN 29*  CREATININE 9.02*  CALCIUM 9.0  PROT 6.8  BILITOT 0.4  ALKPHOS 100  ALT 11  AST 16  GLUCOSE 118*     Radiology/Studies: Dg Chest Port 1 View 06/17/2014   CLINICAL DATA:  Five day history of mid chest pain and shortness of breath. Atrial fibrillation. Current history of hypertension and diabetes.   EXAM: PORTABLE CHEST - 1 VIEW  COMPARISON:  Portable chest x-ray 05/17/2014, 05/06/2014. Two-view chest x-ray 04/05/2014, 10/23/2013.  FINDINGS: Cardiac silhouette mildly enlarged but stable. Thoracic aorta tortuous and mildly atherosclerotic, unchanged. Prominent central right pulmonary artery, unchanged. Lungs clear. Bronchovascular markings normal. Pulmonary vascularity normal. No visible pleural effusions. No pneumothorax.  IMPRESSION: Stable mild cardiomegaly.  No acute cardiopulmonary disease.   Electronically Signed   By: Evangeline Dakin M.D.   On: 06/17/2014 19:24   EKG: likely typical atrial flutter with variable AV conduction  TELEMETRY: atrial flutter with controlled ventricular response  Assessment and Plan:  1. Typical appearing atrial flutter The patient has recurrent symptomatic typical appearing atrial flutter.  He is referred by Dr Bronson Ing for catheter ablation.  Though he has afib in his problem list, I do not see this documented.  Given obesity, renal disease, and sleep apnea, he is at risk for atrial arrhythmias in the future. Therapeutic strategies for atrial flutter including medicine and ablation were discussed in detail with the patient today. Risk, benefits, and alternatives to EP study and radiofrequency ablation were also discussed in detail today. These risks include but are not limited to stroke, bleeding, vascular damage, tamponade, perforation, damage to the heart and other structures, AV block requiring pacemaker, and death. The patient understands these risk and wishes to  proceed.  We will therefore proceed with catheter ablation at the next available time.  He will require TEE prior to the procedure. Continue coumadin- goal INR 2-3  2. OSA Compliance with CPAP encouraged  3. ESRD S/p HD yesterday per patient

## 2014-06-18 NOTE — Interval H&P Note (Signed)
History and Physical Interval Note:  06/18/2014 8:27 AM  Albert Lewis  has presented today for surgery, with the diagnosis of flutter  The various methods of treatment have been discussed with the patient and family. After consideration of risks, benefits and other options for treatment, the patient has consented to  Procedure(s): ATRIAL FLUTTER ABLATION (N/A) as a surgical intervention .  The patient's history has been reviewed, patient examined, no change in status, stable for surgery.  I have reviewed the patient's chart and labs.  Questions were answered to the patient's satisfaction.     Thompson Grayer

## 2014-06-18 NOTE — Op Note (Signed)
PREPROCEDURE DIAGNOSIS: Typical-appearing atrial flutter.   POSTPROCEDURE DIAGNOSIS: Isthmus-dependent right atrial flutter.   PROCEDURES:  1. Comprehensive EP study.  2. Coronary sinus pacing and recording.  3. Mapping of SVT.  4. Ablation of SVT.   INTRODUCTION:  Albert Lewis is a 63 y.o. male with a history of symptomatic recurrent typical-appearing atrial flutter. He presents today for EP study and radiofrequency ablation.   DESCRIPTION OF THE PROCEDURE: Informed written consent was obtained, and the patient was brought to the electrophysiology lab in the fasting state. The patient was then adequately sedated with anesthesia as outlined in the anesthesia report. The patient's right groin was prepped and draped in the usual sterile fashion by the EP lab staff. Using a percutaneous Seldinger technique a 6, 7, and 8-French hemostasis sheaths were placed into the right common femoral vein. A 7- [employer] decapolar coronary sinus catheter was introduced through the right common femoral vein and advanced into the coronary sinus for recording and pacing from this location. A 6-French quadripolar Josephson catheter was introduced through the right common femoral vein and advanced into the right ventricle for recording and pacing. This catheter was then pulled back to the His bundle location. The patient presented to the electrophysiology lab in atrial flutter. The surface electrocardiogram was consistent with typical atrial flutter. The coronary sinus activation sequence was proximal to distal and suggestive of right atrial flutter.  The atrial flutter cycle length was 270 msec. Atrial entrainment mapping was then performed. When pacing from the left atrium, a long post pacing interval was observed. With entrainment mapping from the cavotricuspid isthmus, the post pacing interval was only slightly greater than the tachycardia cycle length and therefore suggestive of isthmus-dependent right  atrial flutter. The patient's QRS duration measured 95 msec with an RR interval of 921 msec and an HV interval of 50 msec. I elected to perform cavotricuspid isthmus ablation.   A [employer] 7-French 41mm ablation catheter was introduced through the right common femoral vein and advanced into the right atrium. Mapping of the cavotricuspid isthmus was performed which revealed a standard isthmus. A series of 4 radiofrequency applications were delivered along the cavotricuspid isthmus with a target temperature of 60 degrees with power of 70 watts. During the first radiofrequency ablation lesion, the tachycardia slowed and then terminated. The patient remained in sinus rhythm thereafter. An additional 3 radiofrequency applications were then delivered with similar parameters in order to obtain complete bidirectional cavotricuspid isthmus block.   Following ablation, differential atrial pacing was performed from the low lateral right atrium with a Duodecapolar halo catheter in place. This confirmed complete bidirectional cavotricuspid isthmus block with a stimulus to earliest atrial activation recorded bidirectional across the isthmus measuring 185 msec. The patient was observed for 20 minutes without return of conduction through the isthmus.  Following ablation, the AH interval measured 203 msec with an HV interval of 52 msec. Rapid atrial pacing was performed, which revealed an AV Wenckebach cycle length of 670 msec with no evidence of PR greater than RR and no tachycardias induced when pacing down to a cycle length of 250 msec. Ventricular pacing was then performed which revealed VA dissociation when pacing at a cycle length of 600 msec.  The procedure was therefore considered completed. All catheters were removed, and the sheaths were aspirated and flushed. The sheaths were removed and hemostasis was assured. There were no early apparent complications.   CONCLUSIONS:  1. Isthmus-dependent right atrial  flutter upon presentation, successfully ablated  along the usual cavotricuspid isthmus.  2. Complete bidirectional cavotricuspid isthmus block achieved.  3. No inducible arrhythmias following ablation.  4. No early apparent complications.  Albert Rinks Allred,MD 06/18/2014 2:18 PM

## 2014-06-18 NOTE — Consult Note (Signed)
Reason for Consult:  management of ESRD and dialysis  Referring Physician: Conley Canal, MD  Albert Lewis is an 63 y.o. male.  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  We were asked to help manage his dialysis and renal-related medical conditions while he remains an inpt at [facility].  Trend in Creatinine:  Creatinine, Ser  Date/Time Value Ref Range Status  06/18/2014  3:40 AM 9.02* 0.50 - 1.35 mg/dL Final  06/17/2014  7:40 PM 8.00* 0.50 - 1.35 mg/dL Final  05/17/2014  3:27 PM 6.36* 0.50 - 1.35 mg/dL Final  07/18/2013 10:50 AM 6.06* 0.50 - 1.35 mg/dL Final  05/16/2013 10:50 PM 8.00* 0.50 - 1.35 mg/dL Final  02/14/2013  4:44 PM 6.83* 0.50 - 1.35 mg/dL Final  04/12/2012  3:41 PM 6.60* 0.50 - 1.35 mg/dL Final  01/30/2012  3:41 PM 10.38* 0.50 - 1.35 mg/dL Final  11/26/2010  4:58 PM 8.67* 0.4 - 1.5 mg/dL Final  10/19/2010  4:30 PM 12.23* 0.4 - 1.5 mg/dL Final  08/26/2010 10.52   Final  01/12/2010 10:26 AM 12.58* 0.4 - 1.5 mg/dL Final  01/10/2010  7:31 AM 8.99* 0.4 - 1.5 mg/dL Final  01/09/2010  3:45 AM 11.26* 0.4 - 1.5 mg/dL Final  01/08/2010  5:00 AM 10.01* 0.4 - 1.5 mg/dL Final  01/07/2010 12:55 AM 17.59* 0.4 - 1.5 mg/dL Final  01/06/2010 12:57 PM 17.60* 0.4 - 1.5 mg/dL Final  10/07/2009  4:45 AM 9.47* 0.4 - 1.5 mg/dL Final  10/06/2009  5:57 AM 12.20* 0.4 - 1.5 mg/dL Final  10/05/2009  5:30 PM 10.99* 0.4 - 1.5 mg/dL Final  08/18/2009  1:26 PM 10.43   Final  02/10/2009  4:30 AM 13.18* 0.4 - 1.5 mg/dL Final  02/09/2009  4:00 AM 11.06* 0.4 - 1.5 mg/dL Final  02/08/2009  4:10 AM 14.17* 0.4 - 1.5 mg/dL Final  12/30/2008 11:31 AM 0.94   Final  04/19/2008  6:47 AM 12.32*  Final  02/08/2008 10:05 AM 7.62*  Final  02/02/2008  5:25 AM 10.80*  Final  02/01/2008  5:45 AM 9.04*  Final  11/10/2007  2:55 PM 4.41*   Final    PMH:   Past Medical History  Diagnosis Date  . Paroxysmal atrial fibrillation 2/09    Abnormal stress nuclear study in 01/2008; normal coronary angiography  . Hyperlipidemia 2008  . Hypertension 1993    Mild to moderate LVH  . Obesity     sleep apnea treated with BiPAP  . Anemia   . Gastroesophageal reflux disease   . Hyperparathyroidism     Secondary  . Allergic rhinitis   . Lower gastrointestinal bleed 04/2008    diverticulosis; hemorrhoids; constipation  . Diverticular disease   . Chronic back pain     Degenerative disc disease  . ESRD (end stage renal disease) on dialysis     Dialysis since 2007  . Renal insufficiency   . Sleep apnea 2010    CPAP followed at [place]  . Cataract     s/p extraction x 2  . Diabetes mellitus, type 2 1993    no insulin  . Atrial flutter     PSH:   Past Surgical History  Procedure Laterality Date  . Cataract extraction w/ intraocular lens  implant, bilateral  2008  .  Lumbar peritoneal shunt    . Exploratory laparotomy      shunt complications  . Back surgery    . Cholecystectomy  1999  . Spine surgery      twice in lower back    Allergies:  Allergies  Allergen Reactions  . Actos [Pioglitazone]   . Celebrex [Celecoxib]   . Penicillins Other (See Comments)    Couldn't walk.     Medications:   Prior to Admission medications   Medication Sig Start Date End Date Taking? Authorizing Shakeitha Umbaugh  acetaminophen (TYLENOL) 325 MG tablet Take 650 mg by mouth every 6 (six) hours as needed for moderate pain.   Yes Historical Sinclaire Artiga, MD  albuterol (PROVENTIL HFA;VENTOLIN HFA) 108 (90 BASE) MCG/ACT inhaler Inhale 2 puffs into the lungs every 6 (six) hours as needed for wheezing. 10/19/12  Yes Alycia Rossetti, MD  albuterol (PROVENTIL) (2.5 MG/3ML) 0.083% nebulizer solution Take 3 mLs (2.5 mg total) by nebulization every 6 (six) hours as needed for wheezing or shortness of breath. 10/30/13  Yes Fayrene Helper, MD  allopurinol  (ZYLOPRIM) 100 MG tablet Take 100 mg by mouth daily.    Yes Historical Latressa Harries, MD  B Complex-C-Folic Acid (RENAL MULTIVITAMIN FORMULA PO) Take 1 tablet by mouth daily.   Yes Historical Breion Novacek, MD  cetirizine (ZYRTEC) 10 MG tablet Take 10 mg by mouth daily as needed for allergies.    Yes Historical Janea Schwenn, MD  cinacalcet (SENSIPAR) 90 MG tablet Take 90 mg by mouth daily.   Yes Historical Shaindy Reader, MD  docusate sodium (COLACE) 100 MG capsule Take 200 mg by mouth 2 (two) times daily as needed for moderate constipation.    Yes Historical Jeilyn Reznik, MD  fish oil-omega-3 fatty acids 1000 MG capsule Take 2 g by mouth 3 (three) times daily. SUPER OMEGA 3 FORMULA: EPA 300/DHA200   Yes Historical Sunita Demond, MD  gabapentin (NEURONTIN) 100 MG capsule Take 1 capsule (100 mg total) by mouth 2 (two) times daily. 01/29/14  Yes Fayrene Helper, MD  gemfibrozil (LOPID) 600 MG tablet Take 600 mg by mouth 2 (two) times daily.   Yes Historical Thirza Pellicano, MD  HYDROcodone-acetaminophen (NORCO/VICODIN) 5-325 MG per tablet Take 1 tablet by mouth 2 (two) times daily.   Yes Historical Arijana Narayan, MD  insulin glargine (LANTUS) 100 UNIT/ML injection Inject 22 Units into the skin at bedtime.    Yes Historical Saroya Riccobono, MD  ipratropium (ATROVENT) 0.02 % nebulizer solution Take 2.5 mLs (0.5 mg total) by nebulization 4 (four) times daily. 10/30/13  Yes Fayrene Helper, MD  metoprolol tartrate (LOPRESSOR) 25 MG tablet Take 25 mg by mouth 2 (two) times daily.   Yes Historical Gredmarie Delange, MD  pantoprazole (PROTONIX) 40 MG tablet Take 40 mg by mouth daily.   Yes Historical Ramil Edgington, MD  PRESCRIPTION MEDICATION Place 1 drop into both eyes 3 (three) times daily. For dry eyes   Yes Historical Inger Wiest, MD  sevelamer (RENVELA) 800 MG tablet Take 4,800 mg by mouth 3 (three) times daily with meals. Takes 6 tablets (4800 mg) three times daily with meals and 3 tablets (2400 mg) with snack.   Yes Historical Yasmen Cortner, MD  warfarin (COUMADIN) 5  MG tablet Take 5 mg by mouth every evening. Per Lenice Pressman Historical Waverly Shellhammer, MD    Inpatient medications: . allopurinol  100 mg Oral Daily  . cinacalcet  90 mg Oral Q breakfast  . gabapentin  100 mg Oral BID  . gemfibrozil  600 mg Oral BID AC  .  HYDROcodone-acetaminophen  1 tablet Oral BID  . insulin aspart  0-9 Units Subcutaneous TID WC  . insulin glargine  22 Units Subcutaneous QHS  . loratadine  10 mg Oral Daily  . metoprolol tartrate  25 mg Oral BID  . omega-3 acid ethyl esters  2 g Oral TID WC  . pantoprazole  40 mg Oral Daily  . sevelamer carbonate  4,800 mg Oral TID WC  . sodium chloride  3 mL Intravenous Q12H  . sodium chloride  3 mL Intravenous Q12H  . warfarin  6 mg Oral ONCE-1800  . Warfarin - Pharmacist Dosing Inpatient   Does not apply q1800    Discontinued Meds:   Medications Discontinued During This Encounter  Medication Reason  . diltiazem (CARDIZEM) injection 20 mg   . albuterol (PROVENTIL HFA;VENTOLIN HFA) 108 (90 BASE) MCG/ACT inhaler 2 puff   . enoxaparin (LOVENOX) injection 30 mg     Social History:  reports that he has quit smoking. His smoking use included Cigarettes. He smoked 0.00 packs per day. He has never used smokeless tobacco. He reports that he does not drink alcohol or use illicit drugs.  Family History:   Family History  Problem Relation Age of Onset  . Hypertension Mother   . Cancer Mother 6    breast   . Hypertension Sister 105  . Hypertension Daughter 24  . Diabetes Daughter     A comprehensive review of systems was negative except for: Constitutional: positive for fatigue Respiratory: positive for dyspnea on exertion Cardiovascular: positive for chest pressure/discomfort, lower extremity edema and palpitations Weight change:   Intake/Output Summary (Last 24 hours) at 06/18/14 1140 Last data filed at 06/18/14 0737  Gross per 24 hour  Intake      0 ml  Output      0 ml  Net      0 ml   BP 144/82  Pulse 68  Temp(Src) 98.7  F (37.1 C) (Oral)  Resp 18  Ht 6\' 3"  (1.905 m)  Wt 122.698 kg (270 lb 8 oz)  BMI 33.81 kg/m2  SpO2 95% Filed Vitals:   06/18/14 0045 06/18/14 0100 06/18/14 0121 06/18/14 0532  BP: 132/61 130/57 160/76 144/82  Pulse: 68 69 69 68  Temp:   98 F (36.7 C) 98.7 F (37.1 C)  TempSrc:    Oral  Resp: 14 12 15 18   Height:      Weight:   122.698 kg (270 lb 8 oz)   SpO2: 92% 92% 94% 95%     General appearance: alert, cooperative, no distress and moderately obese Head: Normocephalic, without obvious abnormality, atraumatic Eyes: negative findings: lids and lashes normal, conjunctivae and sclerae normal and corneas clear Neck: no adenopathy, no carotid bruit, supple, symmetrical, trachea midline and thyroid not enlarged, symmetric, no tenderness/mass/nodules Resp: clear to auscultation bilaterally Cardio: regular rate and rhythm and no rub GI: soft, non-tender; bowel sounds normal; no masses,  no organomegaly Extremities: edema trace pretib edema and LUE AVF +T/B  Labs: Basic Metabolic Panel:  Recent Labs Lab 06/17/14 1940 06/17/14 2314 06/18/14 0340  NA 141  --  140  K 5.4* 5.2 5.3  CL 94*  --  96  CO2 27  --  25  GLUCOSE 173*  --  118*  BUN 22  --  29*  CREATININE 8.00*  --  9.02*  ALBUMIN  --   --  3.3*  CALCIUM 8.9  --  9.0   Liver Function Tests:  Recent  Labs Lab 06/18/14 0340  AST 16  ALT 11  ALKPHOS 100  BILITOT 0.4  PROT 6.8  ALBUMIN 3.3*   No results found for this basename: LIPASE, AMYLASE,  in the last 168 hours No results found for this basename: AMMONIA,  in the last 168 hours CBC:  Recent Labs Lab 06/17/14 1940 06/18/14 0340  WBC 6.4 6.1  HGB 14.1 13.7  HCT 43.3 43.2  MCV 96.0 97.3  PLT 222 192   PT/INR: @LABRCNTIP (inr:5) Cardiac Enzymes: ) Recent Labs Lab 06/18/14 0340 06/18/14 0835  TROPONINI <0.30 <0.30   CBG:  Recent Labs Lab 06/18/14 0234 06/18/14 0639 06/18/14 0950  GLUCAP 149* 86 93    Iron Studies: No results found  for this basename: IRON, TIBC, TRANSFERRIN, FERRITIN,  in the last 168 hours  Xrays/Other Studies: Dg Chest Port 1 View  06/17/2014   CLINICAL DATA:  Five day history of mid chest pain and shortness of breath. Atrial fibrillation. Current history of hypertension and diabetes.  EXAM: PORTABLE CHEST - 1 VIEW  COMPARISON:  Portable chest x-ray 05/17/2014, 05/06/2014. Two-view chest x-ray 04/05/2014, 10/23/2013.  FINDINGS: Cardiac silhouette mildly enlarged but stable. Thoracic aorta tortuous and mildly atherosclerotic, unchanged. Prominent central right pulmonary artery, unchanged. Lungs clear. Bronchovascular markings normal. Pulmonary vascularity normal. No visible pleural effusions. No pneumothorax.  IMPRESSION: Stable mild cardiomegaly.  No acute cardiopulmonary disease.   Electronically Signed   By: Evangeline Dakin M.D.   On: 06/17/2014 19:24   OutPt HD orders:  2K/2.5Ca/BFR500/DBR600/buttonhole/EDW 114kg duration 3:30 Hectoral 78mcg, Venofer 50mg  IV qweek,   Assessment/Plan: 1.  ESRD- plan to cont with HD q MWF while he remains an inpt. 2. A flutter- s/p cardioversion.  Now on coumadin 3. ACDz- hgb >12 not on EPO 4. SHPTH- cont hectoral, cont with renvela 5. HTN- stable 6. Vascular access- LAVF will use buttonhole technique. 7. DM- per primary svc. 8. Hyperkalemia- follow after HD tomorrow.   COLADONATO,JOSEPH A 06/18/2014, 11:40 AM

## 2014-06-18 NOTE — Progress Notes (Addendum)
Doing well s/p ablation for atrial flutter Continue coumadin with goal INR 2-3  Stop metoprolol  Would anticipate discharge to home in the am.

## 2014-06-18 NOTE — Progress Notes (Signed)
ANTICOAGULATION CONSULT NOTE - Initial Consult  Pharmacy Consult for  Coumadin Indication: atrial fibrillation  Allergies  Allergen Reactions  . Actos [Pioglitazone]   . Celebrex [Celecoxib]   . Penicillins Other (See Comments)    Couldn't walk.     Patient Measurements: Height: 6\' 3"  (190.5 cm) Weight: 270 lb 8 oz (122.698 kg) IBW/kg (Calculated) : 84.5  Vital Signs: Temp: 98 F (36.7 C) (06/30 0121) Temp src: Oral (06/29 1851) BP: 160/76 mmHg (06/30 0121) Pulse Rate: 69 (06/30 0121)  Labs:  Recent Labs  06/17/14 1940  HGB 14.1  HCT 43.3  PLT 222  LABPROT 22.7*  INR 2.00*  CREATININE 8.00*    Estimated Creatinine Clearance: 13.3 ml/min (by C-G formula based on Cr of 8).   Medical History: Past Medical History  Diagnosis Date  . Paroxysmal atrial fibrillation 2/09    Abnormal stress nuclear study in 01/2008; normal coronary angiography  . Hyperlipidemia 2008  . Hypertension 1993    Mild to moderate LVH  . Obesity     sleep apnea treated with BiPAP  . Anemia   . Gastroesophageal reflux disease   . Hyperparathyroidism     Secondary  . Allergic rhinitis   . Lower gastrointestinal bleed 04/2008    diverticulosis; hemorrhoids; constipation  . Diverticular disease   . Chronic back pain     Degenerative disc disease  . ESRD (end stage renal disease) on dialysis     Dialysis since 2007  . Renal insufficiency   . Sleep apnea 2010    CPAP followed at [place]  . Cataract     s/p extraction x 2  . Diabetes mellitus, type 2 1993    no insulin    Medications:  Prescriptions prior to admission  Medication Sig Dispense Refill  . acetaminophen (TYLENOL) 325 MG tablet Take 650 mg by mouth every 6 (six) hours as needed for moderate pain.      Marland Kitchen albuterol (PROVENTIL HFA;VENTOLIN HFA) 108 (90 BASE) MCG/ACT inhaler Inhale 2 puffs into the lungs every 6 (six) hours as needed for wheezing.  1 Inhaler  2  . albuterol (PROVENTIL) (2.5 MG/3ML) 0.083% nebulizer solution Take  3 mLs (2.5 mg total) by nebulization every 6 (six) hours as needed for wheezing or shortness of breath.  75 mL  0  . allopurinol (ZYLOPRIM) 100 MG tablet Take 100 mg by mouth daily.       . B Complex-C-Folic Acid (RENAL MULTIVITAMIN FORMULA PO) Take 1 tablet by mouth daily.      . cetirizine (ZYRTEC) 10 MG tablet Take 10 mg by mouth daily as needed for allergies.       . cinacalcet (SENSIPAR) 90 MG tablet Take 90 mg by mouth daily.      Marland Kitchen docusate sodium (COLACE) 100 MG capsule Take 200 mg by mouth 2 (two) times daily as needed for moderate constipation.       . fish oil-omega-3 fatty acids 1000 MG capsule Take 2 g by mouth 3 (three) times daily. SUPER OMEGA 3 FORMULA: EPA 300/DHA200      . gabapentin (NEURONTIN) 100 MG capsule Take 1 capsule (100 mg total) by mouth 2 (two) times daily.  60 capsule  3  . gemfibrozil (LOPID) 600 MG tablet Take 600 mg by mouth 2 (two) times daily.      Marland Kitchen HYDROcodone-acetaminophen (NORCO/VICODIN) 5-325 MG per tablet Take 1 tablet by mouth 2 (two) times daily.      . insulin glargine (LANTUS) 100 UNIT/ML  injection Inject 22 Units into the skin at bedtime.       Marland Kitchen ipratropium (ATROVENT) 0.02 % nebulizer solution Take 2.5 mLs (0.5 mg total) by nebulization 4 (four) times daily.  75 mL  0  . metoprolol tartrate (LOPRESSOR) 25 MG tablet Take 25 mg by mouth 2 (two) times daily.      . pantoprazole (PROTONIX) 40 MG tablet Take 40 mg by mouth daily.      Marland Kitchen PRESCRIPTION MEDICATION Place 1 drop into both eyes 3 (three) times daily. For dry eyes      . sevelamer (RENVELA) 800 MG tablet Take 4,800 mg by mouth 3 (three) times daily with meals. Takes 6 tablets (4800 mg) three times daily with meals and 3 tablets (2400 mg) with snack.      . warfarin (COUMADIN) 5 MG tablet Take 5 mg by mouth every evening. Per Lattie Haw        Assessment: 63 yo male admitted with palpitations, h/o Afib, to continue Coumadin  Goal of Therapy:  INR 2-3 Monitor platelets by anticoagulation protocol:  Yes   Plan:  Daily INR  Abbott, Bronson Curb 06/18/2014,1:43 AM

## 2014-06-18 NOTE — Progress Notes (Signed)
Site area: right groin  Site Prior to Removal:  Level 0  Pressure Applied For 20  MINUTES by Coletta Memos, RN    Manual: yes  Patient Status During Pull:  stable  Post Pull Groin Site:  Level 0  Post Pull Instructions Given:  Yes.    Post Pull Pulses Present: yes  Dressing Applied:  yes   Bedrest begins16:00:00  CommentsPatient remained stable during sheath pull

## 2014-06-18 NOTE — Anesthesia Postprocedure Evaluation (Signed)
  Anesthesia Post-op Note  Patient: Albert Lewis  Procedure(s) Performed: Procedure(s): ATRIAL FLUTTER ABLATION (N/A)  Patient Location: PACU  Anesthesia Type:General  Level of Consciousness: awake, alert , oriented and patient cooperative  Airway and Oxygen Therapy: Patient Spontanous Breathing and Patient connected to face mask oxygen  Post-op Pain: none  Post-op Assessment: Post-op Vital signs reviewed, Patient's Cardiovascular Status Stable, Respiratory Function Stable, Patent Airway, No signs of Nausea or vomiting and Pain level controlled  Post-op Vital Signs: Reviewed and stable  Last Vitals:  Filed Vitals:   06/18/14 1516  BP: 141/66  Pulse: 74  Temp:   Resp: 14    Complications: patient disoriented and combative on emergence, cleared rapidly.  No other anesthetic complications.

## 2014-06-18 NOTE — Progress Notes (Signed)
6 Hour bed rest begins

## 2014-06-18 NOTE — Anesthesia Preprocedure Evaluation (Addendum)
Anesthesia Evaluation  Patient identified by MRN, date of birth, ID band Patient awake    Reviewed: Allergy & Precautions, H&P , NPO status , Patient's Chart, lab work & pertinent test results  Airway Mallampati: II TM Distance: >3 FB Neck ROM: Full    Dental  (+) Missing, Dental Advisory Given   Pulmonary sleep apnea and Continuous Positive Airway Pressure Ventilation , former smoker,  breath sounds clear to auscultation        Cardiovascular hypertension, Pt. on medications + dysrhythmias Atrial Fibrillation Rhythm:Regular Rate:Normal  echo 04-2014 demonstrated EF 55-60%, LA 43, no significant valvular abnormalities.      Neuro/Psych    GI/Hepatic   Endo/Other  diabetes, Well Controlled, Type 2, Insulin Dependent, Oral Hypoglycemic AgentsMorbid obesity  Renal/GU ESRF and DialysisRenal disease     Musculoskeletal   Abdominal   Peds  Hematology   Anesthesia Other Findings   Reproductive/Obstetrics                          Anesthesia Physical Anesthesia Plan  ASA: III  Anesthesia Plan: MAC   Post-op Pain Management:    Induction: Intravenous  Airway Management Planned: Simple Face Mask  Additional Equipment:   Intra-op Plan:   Post-operative Plan:   Informed Consent: I have reviewed the patients History and Physical, chart, labs and discussed the procedure including the risks, benefits and alternatives for the proposed anesthesia with the patient or authorized representative who has indicated his/her understanding and acceptance.   Dental advisory given  Plan Discussed with: CRNA, Anesthesiologist and Surgeon  Anesthesia Plan Comments:         Anesthesia Quick Evaluation

## 2014-06-18 NOTE — ED Notes (Signed)
Alert, NAD, calm, interactive, speech clear, (denies: pain, HA, sob, dizziness or nausea), resting/ sleeping.

## 2014-06-18 NOTE — Progress Notes (Signed)
Cath Lab personnel at bedside to pull sheath

## 2014-06-18 NOTE — Progress Notes (Signed)
[facility] for  Coumadin Indication: atrial fibrillation  Allergies  Allergen Reactions  . Actos [Pioglitazone]   . Celebrex [Celecoxib]   . Penicillins Other (See Comments)    Couldn't walk.     Patient Measurements: Height: 6\' 3"  (190.5 cm) Weight: 270 lb 8 oz (122.698 kg) IBW/kg (Calculated) : 84.5  Vital Signs: Temp: 98.7 F (37.1 C) (06/30 0532) Temp src: Oral (06/30 0532) BP: 144/82 mmHg (06/30 0532) Pulse Rate: 68 (06/30 0532)  Labs:  Recent Labs  06/17/14 1940 06/18/14 0340 06/18/14 0835  HGB 14.1 13.7  --   HCT 43.3 43.2  --   PLT 222 192  --   LABPROT 22.7* 22.5*  --   INR 2.00* 1.98*  --   CREATININE 8.00* 9.02*  --   TROPONINI  --  <0.30 <0.30    Estimated Creatinine Clearance: 11.8 ml/min (by C-G formula based on Cr of 9.02).     Assessment: 63 yo male admitted with palpitations, h/o Afib, to continue Coumadin.  Home coumadin dose is 5 mg daily with last dose PTA 6/29.  INR is 1.98 today.  To have a flutter ablation today.  No bleeding reported.  CBC stable.  Goal of Therapy:  INR 2-3   Plan:  Coumadin 6 mg po x 1 dose today Daily INR Eudelia Bunch, Pharm.D. [phone] 06/18/2014 10:07 AM

## 2014-06-18 NOTE — Progress Notes (Signed)
Pt back from cath lab; R groin level 0; bedrest until 2200; pt and family made aware; pt given [place] sandwich at this time; no c/o pain; pt HOB up to 30 degrees at this time; will cont. To monitor.

## 2014-06-18 NOTE — Progress Notes (Signed)
CRITICAL VALUE ALERT  Critical value received:  Troponin I  2.58  Date of notification: 06/18/14   Time of notification: 20:55  Critical value read back:Yes.    Nurse who received alert: Griffin Dakin   MD notified (1st page):  Dr. Jules Husbands  Time of first page:  20.:57  MD notified (2nd page):  Time of second page:  Responding MD: Dr. Donnelly Angelica   Time MD responded: 20:58  Discussed lab value with Dr. Claiborne Billings and advised pt had ablation today by Dr. Rayann Heman and all Troponins prior to procedure had been normal. Advised pt having no chest pain and vital signs stable. He advised lab value probably associated with procedure and to make sure that no more troponins are ordered.

## 2014-06-18 NOTE — Transfer of Care (Signed)
Immediate Anesthesia Transfer of Care Note  Patient: Albert Lewis  Procedure(s) Performed: Procedure(s): ATRIAL FLUTTER ABLATION (N/A)  Patient Location: PACU  Anesthesia Type:General  Level of Consciousness: awake and pateint uncooperative  Airway & Oxygen Therapy: Patient Spontanous Breathing and Patient connected to face mask oxygen  Post-op Assessment: Report given to PACU RN, Post -op Vital signs reviewed and stable and Patient moving all extremities  Post vital signs: Reviewed and stable  Complications: No apparent anesthesia complications

## 2014-06-18 NOTE — Care Management Utilization Note (Signed)
UR completed.    Julie Wise Amerson, RN, BSN Phone #[phone]  

## 2014-06-19 DIAGNOSIS — D638 Anemia in other chronic diseases classified elsewhere: Secondary | ICD-10-CM

## 2014-06-19 LAB — RENAL FUNCTION PANEL
ALBUMIN: 3.3 g/dL — AB (ref 3.5–5.2)
BUN: 44 mg/dL — AB (ref 6–23)
CALCIUM: 8.4 mg/dL (ref 8.4–10.5)
CO2: 26 mEq/L (ref 19–32)
Chloride: 95 mEq/L — ABNORMAL LOW (ref 96–112)
Creatinine, Ser: 10.88 mg/dL — ABNORMAL HIGH (ref 0.50–1.35)
GFR calc Af Amer: 5 mL/min — ABNORMAL LOW (ref >90)
GFR calc non Af Amer: 4 mL/min — ABNORMAL LOW (ref >90)
Glucose, Bld: 69 mg/dL — ABNORMAL LOW (ref 70–99)
Phosphorus: 7.4 mg/dL — ABNORMAL HIGH (ref 2.3–4.6)
Potassium: 6.9 mEq/L (ref 3.7–5.3)
Sodium: 140 mEq/L (ref 137–147)

## 2014-06-19 LAB — PROTIME-INR
INR: 2.1 — AB (ref 0.00–1.49)
Prothrombin Time: 23.6 seconds — ABNORMAL HIGH (ref 11.6–15.2)

## 2014-06-19 LAB — HEPATITIS B SURFACE ANTIBODY,QUALITATIVE: Hep B S Ab: POSITIVE — AB

## 2014-06-19 LAB — GLUCOSE, CAPILLARY
GLUCOSE-CAPILLARY: 166 mg/dL — AB (ref 70–99)
Glucose-Capillary: 63 mg/dL — ABNORMAL LOW (ref 70–99)

## 2014-06-19 LAB — HEPATITIS B SURFACE ANTIGEN: HEP B S AG: NEGATIVE

## 2014-06-19 LAB — HEPATITIS B CORE ANTIBODY, TOTAL: Hep B Core Total Ab: NONREACTIVE

## 2014-06-19 MED ORDER — SODIUM CHLORIDE 0.9 % IV SOLN: 100.0000 mL | INTRAVENOUS | Status: DC | PRN

## 2014-06-19 MED ORDER — NEPRO/CARBSTEADY PO LIQD: 237.0000 mL | ORAL | Status: DC | PRN

## 2014-06-19 MED ORDER — LIDOCAINE HCL (PF) 1 % IJ SOLN: 5.0000 mL | INTRAMUSCULAR | Status: DC | PRN

## 2014-06-19 MED ORDER — PENTAFLUOROPROP-TETRAFLUOROETH EX AERO: 1.0000 "application " | INHALATION_SPRAY | CUTANEOUS | Status: DC | PRN

## 2014-06-19 MED ORDER — WARFARIN SODIUM 5 MG PO TABS
5.0000 mg | ORAL_TABLET | Freq: Every day | ORAL | Status: DC
  Filled 2014-06-19: qty 1

## 2014-06-19 MED ORDER — HEPARIN SODIUM (PORCINE) 1000 UNIT/ML DIALYSIS
20.0000 [IU]/kg | INTRAMUSCULAR | Status: DC | PRN
  Administered 2014-06-19: 2500 [IU] via INTRAVENOUS_CENTRAL

## 2014-06-19 MED ORDER — DOXERCALCIFEROL 4 MCG/2ML IV SOLN
INTRAVENOUS | Status: AC
  Administered 2014-06-19: 7 ug via INTRAVENOUS
  Filled 2014-06-19: qty 4

## 2014-06-19 MED ORDER — HEPARIN SODIUM (PORCINE) 1000 UNIT/ML DIALYSIS: 1000.0000 [IU] | INTRAMUSCULAR | Status: DC | PRN

## 2014-06-19 MED ORDER — LIDOCAINE-PRILOCAINE 2.5-2.5 % EX CREA: 1.0000 "application " | TOPICAL_CREAM | CUTANEOUS | Status: DC | PRN

## 2014-06-19 MED ORDER — ALTEPLASE 2 MG IJ SOLR: 2.0000 mg | Freq: Once | INTRAMUSCULAR | Status: DC | PRN

## 2014-06-19 NOTE — Progress Notes (Signed)
Pt back from HD at this time; BS 63; pt given Sprite and [place] sandwich; pt asymptomatic at this time; VSS; pt anticipating d/c home today; pt lives alone with family support; will cont. To monitor.

## 2014-06-19 NOTE — Discharge Summary (Signed)
Physician Discharge Summary  Albert Lewis UUV:[id number] DOB: 1951/07/14 DOA: 06/17/2014  PCP: Tula Nakayama, MD  Admit date: 06/17/2014 Discharge date: 06/19/2014  Time spent: greater than 30 minutes  Discharge Diagnoses:  Principal Problem:   Atrial flutter with rapid ventricular response Active Problems:   Benign HYPERTENSION   Chest discomfort   Diabetes mellitus with renal manifestations ESRD OSA hyperlipidemia  Discharge Condition:   Filed Weights   06/19/14 4742 06/19/14 0659 06/19/14 1104  Weight: 112.1 kg (247 lb 2.2 oz) 114.7 kg (252 lb 13.9 oz) 112.2 kg (247 lb 5.7 oz)    History of present illness:  63 y.o. male with history of atrial fibrillation, diabetes mellitus, ESRD on hemodialysis Monday Wednesday and Friday, OSA, hypertension, hyperlipidemia was referred to the ER by patient's cardiologist as patient was found to be in atrial flutter with RVR. In the ER patient was initially found to be in atrial flutter with RVR which spontaneously reduced rate to around 100 per minute. Patient is still in atrial flutter. Patient states that he had dialysis today and had 4 PM followup office visit with patient's cardiologist and during that visit patient started having palpitations. Patient has been having some chest pressure along with it. At this time chest pressure has improved. Patient states that last few weeks his blood pressure was significantly dropping and patient's nephrologist has discontinued his Cardizem and his only on metoprolol 25 mg twice daily for last one week. His nephrologist has called in Cardizem tablets at a low dose which has yet to receive through the mail. Patient otherwise denies any nausea vomiting shortness of breath diarrhea fever chills headache focal deficits  Hospital Course: Admitted to hospitalists, EP and nephrology consulted. Dr. Rayann Heman performed TEE then SVT ablation.  Monitored overnight, remained in sinus rhythm, was cleared for discharge  by cardiology  Had dialysis per usual schedule Patient had dialysis  Procedures:  Consultations:  Electrophysiology  nephrology  Discharge Exam: Filed Vitals:   06/19/14 1104  BP: 119/61  Pulse: 79  Temp: 97.8 F (36.6 C)  Resp: 14    General: alert, oriented Cardiovascular: RRR Respiratory: CTA  Discharge Instructions You were cared for by a hospitalist during your hospital stay. If you have any questions about your discharge medications or the care you received while you were in the hospital after you are discharged, you can call the unit and asked to speak with the hospitalist on call if the hospitalist that took care of you is not available. Once you are discharged, your primary care physician will handle any further medical issues. Please note that NO REFILLS for any discharge medications will be authorized once you are discharged, as it is imperative that you return to your primary care physician (or establish a relationship with a primary care physician if you do not have one) for your aftercare needs so that they can reassess your need for medications and monitor your lab values.     Medication List    STOP taking these medications       metoprolol tartrate 25 MG tablet  Commonly known as:  LOPRESSOR      TAKE these medications       acetaminophen 325 MG tablet  Commonly known as:  TYLENOL  Take 650 mg by mouth every 6 (six) hours as needed for moderate pain.     albuterol 108 (90 BASE) MCG/ACT inhaler  Commonly known as:  PROVENTIL HFA;VENTOLIN HFA  Inhale 2 puffs into the lungs every 6 (  six) hours as needed for wheezing.     albuterol (2.5 MG/3ML) 0.083% nebulizer solution  Commonly known as:  PROVENTIL  Take 3 mLs (2.5 mg total) by nebulization every 6 (six) hours as needed for wheezing or shortness of breath.     allopurinol 100 MG tablet  Commonly known as:  ZYLOPRIM  Take 100 mg by mouth daily.     cetirizine 10 MG tablet  Commonly known as:   ZYRTEC  Take 10 mg by mouth daily as needed for allergies.     cinacalcet 90 MG tablet  Commonly known as:  SENSIPAR  Take 90 mg by mouth daily.     docusate sodium 100 MG capsule  Commonly known as:  COLACE  Take 200 mg by mouth 2 (two) times daily as needed for moderate constipation.     fish oil-omega-3 fatty acids 1000 MG capsule  Take 2 g by mouth 3 (three) times daily. SUPER OMEGA 3 FORMULA: EPA 300/DHA200     gabapentin 100 MG capsule  Commonly known as:  NEURONTIN  Take 1 capsule (100 mg total) by mouth 2 (two) times daily.     gemfibrozil 600 MG tablet  Commonly known as:  LOPID  Take 600 mg by mouth 2 (two) times daily.     HYDROcodone-acetaminophen 5-325 MG per tablet  Commonly known as:  NORCO/VICODIN  Take 1 tablet by mouth 2 (two) times daily.     insulin glargine 100 UNIT/ML injection  Commonly known as:  LANTUS  Inject 22 Units into the skin at bedtime.     ipratropium 0.02 % nebulizer solution  Commonly known as:  ATROVENT  Take 2.5 mLs (0.5 mg total) by nebulization 4 (four) times daily.     pantoprazole 40 MG tablet  Commonly known as:  PROTONIX  Take 40 mg by mouth daily.     PRESCRIPTION MEDICATION  Place 1 drop into both eyes 3 (three) times daily. For dry eyes     RENAL MULTIVITAMIN FORMULA PO  Take 1 tablet by mouth daily.     RENVELA 800 MG tablet  Generic drug:  sevelamer carbonate  Take 4,800 mg by mouth 3 (three) times daily with meals. Takes 6 tablets (4800 mg) three times daily with meals and 3 tablets (2400 mg) with snack.     warfarin 5 MG tablet  Commonly known as:  COUMADIN  Take 5 mg by mouth every evening. Per Lattie Haw       Allergies  Allergen Reactions  . Actos [Pioglitazone]   . Celebrex [Celecoxib]   . Penicillins Other (See Comments)    Couldn't walk.       The results of significant diagnostics from this hospitalization (including imaging, microbiology, ancillary and laboratory) are listed below for reference.     Significant Diagnostic Studies: Dg Chest Port 1 View  06/17/2014   CLINICAL DATA:  Five day history of mid chest pain and shortness of breath. Atrial fibrillation. Current history of hypertension and diabetes.  EXAM: PORTABLE CHEST - 1 VIEW  COMPARISON:  Portable chest x-ray 05/17/2014, 05/06/2014. Two-view chest x-ray 04/05/2014, 10/23/2013.  FINDINGS: Cardiac silhouette mildly enlarged but stable. Thoracic aorta tortuous and mildly atherosclerotic, unchanged. Prominent central right pulmonary artery, unchanged. Lungs clear. Bronchovascular markings normal. Pulmonary vascularity normal. No visible pleural effusions. No pneumothorax.  IMPRESSION: Stable mild cardiomegaly.  No acute cardiopulmonary disease.   Electronically Signed   By: Evangeline Dakin M.D.   On: 06/17/2014 19:24   EKG Atrial flutter with  2 to 1 block Rightward axis Nonspecific ST abnormality  Echo Left ventricle: Systolic function was moderately reduced. The estimated ejection fraction was in the range of 35% to 40%. Wall motion was normal; there were no regional wall motion abnormalities. - Mitral valve: There was mild regurgitation. - Left atrium: The atrium was dilated. No evidence of thrombus in the atrial cavity or appendage. No evidence of thrombus in the atrial cavity or appendage. No evidence of thrombus in the appendage. - Right atrium: No evidence of thrombus in the atrial cavity or appendage. - Atrial septum: No defect or patent foramen ovale was identified.  Impressions:  - No cardiac source of emboli was indentified.  Microbiology: No results found for this or any previous visit (from the past 240 hour(s)).   Labs: Basic Metabolic Panel:  Recent Labs Lab 06/17/14 1940 06/17/14 2314 06/18/14 0340 06/19/14 0445  NA 141  --  140 140  K 5.4* 5.2 5.3 6.9*  CL 94*  --  96 95*  CO2 27  --  25 26  GLUCOSE 173*  --  118* 69*  BUN 22  --  29* 44*  CREATININE 8.00*  --  9.02* 10.88*  CALCIUM 8.9   --  9.0 8.4  PHOS  --   --   --  7.4*   Liver Function Tests:  Recent Labs Lab 06/18/14 0340 06/19/14 0445  AST 16  --   ALT 11  --   ALKPHOS 100  --   BILITOT 0.4  --   PROT 6.8  --   ALBUMIN 3.3* 3.3*   No results found for this basename: LIPASE, AMYLASE,  in the last 168 hours No results found for this basename: AMMONIA,  in the last 168 hours CBC:  Recent Labs Lab 06/17/14 1940 06/18/14 0340  WBC 6.4 6.1  HGB 14.1 13.7  HCT 43.3 43.2  MCV 96.0 97.3  PLT 222 192   Cardiac Enzymes:  Recent Labs Lab 06/18/14 0340 06/18/14 0835 06/18/14 1915  TROPONINI <0.30 <0.30 2.58*   BNP: BNP (last 3 results)  Recent Labs  06/17/14 1940  PROBNP 2925.0*   CBG:  Recent Labs Lab 06/18/14 1502 06/18/14 1633 06/18/14 2101 06/19/14 0616 06/19/14 1124  GLUCAP 112* 72 106* 166* 63*   Signed:  Gael Delude L  [facility] 06/19/2014, 12:41 PM

## 2014-06-19 NOTE — Progress Notes (Signed)
SUBJECTIVE: The patient is doing well today.  At this time, he denies chest pain, shortness of breath, or any new concerns. S/p RFCA for atrial flutter yesterday.  Palpitations and chest pressure resolved.  Troponin elevated last night after ablation - pt asymptomatic.  K 6.9 this morning - for HD today.   CURRENT MEDICATIONS: . allopurinol  100 mg Oral Daily  . cinacalcet  90 mg Oral Q breakfast  . doxercalciferol  7 mcg Intravenous Q M,W,F-HD  . gabapentin  100 mg Oral BID  . gemfibrozil  600 mg Oral BID AC  . HYDROcodone-acetaminophen  1 tablet Oral BID  . insulin aspart  0-9 Units Subcutaneous TID WC  . insulin glargine  22 Units Subcutaneous QHS  . loratadine  10 mg Oral Daily  . multivitamin  1 tablet Oral QHS  . omega-3 acid ethyl esters  2 g Oral TID WC  . pantoprazole  40 mg Oral Daily  . sevelamer carbonate  4,800 mg Oral TID WC  . sodium chloride  3 mL Intravenous Q12H  . sodium chloride  3 mL Intravenous Q12H  . sodium chloride  3 mL Intravenous Q12H  . Warfarin - Pharmacist Dosing Inpatient   Does not apply q1800      OBJECTIVE: Physical Exam: Filed Vitals:   06/18/14 2002 06/18/14 2105 06/18/14 2212 06/19/14 0625  BP: 158/57 160/64 163/57 151/49  Pulse:      Temp:  97.6 F (36.4 C)  97.4 F (36.3 C)  TempSrc:  Oral  Oral  Resp:    20  Height:      Weight:    247 lb 2.2 oz (112.1 kg)  SpO2: 91% 94% 92% 95%    Intake/Output Summary (Last 24 hours) at 06/19/14 7939 Last data filed at 06/18/14 1703  Gross per 24 hour  Intake    860 ml  Output     50 ml  Net    810 ml    Telemetry reveals sinus rhythm  GEN- The patient is well appearing, alert and oriented x 3 today.   Head- normocephalic, atraumatic Eyes-  Sclera clear, conjunctiva pink Ears- hearing intact Oropharynx- clear Neck- supple, Lungs- Clear to ausculation bilaterally, normal work of breathing Heart- Regular rate and rhythm, no murmurs, rubs or gallops, PMI not laterally  displaced GI- soft, NT, ND, + BS Extremities- no clubbing, cyanosis, or edema, no hematoma/ bruit Skin- no rash or lesion Psych- euthymic mood, full affect Neuro- strength and sensation are intact  LABS: Basic Metabolic Panel:  Recent Labs  06/18/14 0340 06/19/14 0445  NA 140 140  K 5.3 6.9*  CL 96 95*  CO2 25 26  GLUCOSE 118* 69*  BUN 29* 44*  CREATININE 9.02* 10.88*  CALCIUM 9.0 8.4  PHOS  --  7.4*   Liver Function Tests:  Recent Labs  06/18/14 0340 06/19/14 0445  AST 16  --   ALT 11  --   ALKPHOS 100  --   BILITOT 0.4  --   PROT 6.8  --   ALBUMIN 3.3* 3.3*   CBC:  Recent Labs  06/17/14 1940 06/18/14 0340  WBC 6.4 6.1  HGB 14.1 13.7  HCT 43.3 43.2  MCV 96.0 97.3  PLT 222 192   Cardiac Enzymes:  Recent Labs  06/18/14 0340 06/18/14 0835 06/18/14 1915  TROPONINI <0.30 <0.30 2.58*   Thyroid Function Tests:  Recent Labs  06/18/14 0340  TSH 2.040    RADIOLOGY: Dg Chest Port 1  View 06/17/2014   CLINICAL DATA:  Five day history of mid chest pain and shortness of breath. Atrial fibrillation. Current history of hypertension and diabetes.  EXAM: PORTABLE CHEST - 1 VIEW  COMPARISON:  Portable chest x-ray 05/17/2014, 05/06/2014. Two-view chest x-ray 04/05/2014, 10/23/2013.  FINDINGS: Cardiac silhouette mildly enlarged but stable. Thoracic aorta tortuous and mildly atherosclerotic, unchanged. Prominent central right pulmonary artery, unchanged. Lungs clear. Bronchovascular markings normal. Pulmonary vascularity normal. No visible pleural effusions. No pneumothorax.  IMPRESSION: Stable mild cardiomegaly.  No acute cardiopulmonary disease.   Electronically Signed   By: Evangeline Dakin M.D.   On: 06/17/2014 19:24    ASSESSMENT AND PLAN:  Principal Problem:   Atrial flutter with rapid ventricular response Active Problems:   HYPERTENSION   Chest discomfort   Diabetes mellitus  1. Atrial flutter Resolved s/p ablation Continue coumadin (goal INR 2-3) Stop  metoprolol No further inpatient management  Wound care, activity restrictions discussed with patient.  Follow up appt scheduled with Dr Rayann Heman at [facility] office 08-08-14 at 1:45PM.  Coumadin check in [place] office Tuesday, 7-7 at 8:30AM.   Electrophysiology team to see as needed while here. Please call with questions.

## 2014-06-19 NOTE — Progress Notes (Signed)
[place] for  Coumadin Indication: atrial fibrillation  Allergies  Allergen Reactions  . Actos [Pioglitazone]   . Celebrex [Celecoxib]   . Penicillins Other (See Comments)    Couldn't walk.     Patient Measurements: Height: 6\' 3"  (190.5 cm) Weight: 252 lb 13.9 oz (114.7 kg) (standing wt) IBW/kg (Calculated) : 84.5  Vital Signs: Temp: 98.1 F (36.7 C) (07/01 0659) Temp src: Oral (07/01 0659) BP: 137/66 mmHg (07/01 1002) Pulse Rate: 83 (07/01 1002)  Labs:  Recent Labs  06/17/14 1940 06/18/14 0340 06/18/14 0835 06/18/14 1915 06/19/14 0445  HGB 14.1 13.7  --   --   --   HCT 43.3 43.2  --   --   --   PLT 222 192  --   --   --   LABPROT 22.7* 22.5*  --   --  23.6*  INR 2.00* 1.98*  --   --  2.10*  CREATININE 8.00* 9.02*  --   --  10.88*  TROPONINI  --  <0.30 <0.30 2.58*  --     Estimated Creatinine Clearance: 9.5 ml/min (by C-G formula based on Cr of 10.88).     Assessment: 63 yo male admitted with palpitations, h/o Afib, to continue Coumadin.  Home coumadin dose is 5 mg daily with last dose PTA 6/29.  INR is 2.10 today.  S/p a flutter ablation yesterday.  No bleeding reported.  CBC stable.  Goal of Therapy:  INR 2-3   Plan:  Resume home dose of Coumadin 5 mg daily ?home after HD today? Daily INR Eudelia Bunch, Pharm.D. [phone] 06/19/2014 10:06 AM

## 2014-06-19 NOTE — Procedures (Signed)
Patient was seen on dialysis and the procedure was supervised. BFR 450 [address] LUE AVF BP is 160/71.  Patient appears to be tolerating treatment well, will challenge his EDW by 1kg (was only 0.7 above).  Hopefully will be discharged to home later today.

## 2014-06-19 NOTE — Progress Notes (Signed)
Inpatient Diabetes Program Recommendations  AACE/ADA: New Consensus Statement on Inpatient Glycemic Control (2013)  Target Ranges:  Prepandial:   less than 140 mg/dL      Peak postprandial:   less than 180 mg/dL (1-2 hours)      Critically ill patients:  140 - 180 mg/dL  Results for Albert Lewis, Albert Lewis (MRN [id number]) as of 06/19/2014 11:32  Ref. Range 06/18/2014 13:55 06/18/2014 14:26 06/18/2014 15:02 06/18/2014 16:33 06/18/2014 21:01 06/19/2014 06:16 06/19/2014 11:24  Glucose-Capillary Latest Range: 70-99 mg/dL 60 (L) 118 (H) 112 (H) 72 106 (H) 166 (H) 63 (L)   Inpatient Diabetes Program Recommendations Insulin - Basal: decrease Lantus to 15 units  Thank you  Raoul Pitch BSN, RN,CDE Inpatient Diabetes Coordinator [phone] (team pager)

## 2014-06-19 NOTE — Progress Notes (Signed)
Tele monitor and IV d/c at this time; pt given d/c instructions; pt verbalized understanding; pt to be d/c to discharge lounge at this time; pt ride on the way from [place]; pt agrees to d/c to d/c lounge; will cont. To monitor.

## 2014-06-19 NOTE — Progress Notes (Signed)
CRITICAL VALUE ALERT  Critical value received: KCL 6.9  Date of notification:  06/19/14  Time of notification:  06:05  Critical value read back:Yes.    Nurse who received alert:  Griffin Dakin RN   MD notified (1st page):  Forrest Moron   Time of first page: 06:10  MD notified (2nd page):  Time of second page:  Responding MD:   Time MD responded:   No call received but Amber from cardiology was on the floor seeing pt. Made her aware of KCL value- she advised ok since pt going to HD this am.

## 2014-06-20 NOTE — ED Provider Notes (Signed)
I saw and evaluated the patient, reviewed the resident's note and I agree with the findings and plan.   EKG Interpretation   Date/Time:  Monday June 17 2014 22:52:10 EDT Ventricular Rate:  104 PR Interval:  96 QRS Duration: 92 QT Interval:  347 QTC Calculation: 456 R Axis:   118 Text Interpretation:  Atrial flutter Right axis deviation Borderline repol  abnormality, diffuse leads ED PHYSICIAN INTERPRETATION AVAILABLE IN [facility] Confirmed by TEST, Record ([id number]) on 06/19/2014 8:23:19 AM      Pt with cc of chest discomfort and palpitations. Has hx of PAF - and was noted to be in afib with RVR. Sx improved on it's own it appears, prior to the diltiazem. Exam showed afib for me, otherwise unremarkable. Stable for obs admission.  Varney Biles, MD 06/20/14 [phone]

## 2014-06-25 ENCOUNTER — Ambulatory Visit (INDEPENDENT_AMBULATORY_CARE_PROVIDER_SITE_OTHER): Payer: Medicare Other | Admitting: *Deleted

## 2014-06-25 DIAGNOSIS — I4891 Unspecified atrial fibrillation: Secondary | ICD-10-CM

## 2014-06-25 LAB — POCT INR: INR: 1.8

## 2014-07-02 ENCOUNTER — Ambulatory Visit (INDEPENDENT_AMBULATORY_CARE_PROVIDER_SITE_OTHER): Payer: Medicare Other | Admitting: *Deleted

## 2014-07-02 DIAGNOSIS — I4891 Unspecified atrial fibrillation: Secondary | ICD-10-CM

## 2014-07-02 LAB — POCT INR: INR: 1.8

## 2014-07-09 ENCOUNTER — Ambulatory Visit (INDEPENDENT_AMBULATORY_CARE_PROVIDER_SITE_OTHER): Payer: Medicare Other | Admitting: *Deleted

## 2014-07-09 DIAGNOSIS — I4891 Unspecified atrial fibrillation: Secondary | ICD-10-CM

## 2014-07-09 LAB — POCT INR: INR: 3.5

## 2014-07-15 ENCOUNTER — Encounter (HOSPITAL_COMMUNITY): Payer: Self-pay | Admitting: Emergency Medicine

## 2014-07-15 ENCOUNTER — Emergency Department (HOSPITAL_COMMUNITY): Payer: Medicare Other

## 2014-07-15 ENCOUNTER — Encounter: Payer: Self-pay | Admitting: Cardiovascular Disease

## 2014-07-15 ENCOUNTER — Ambulatory Visit (INDEPENDENT_AMBULATORY_CARE_PROVIDER_SITE_OTHER): Payer: Medicare Other | Admitting: Cardiovascular Disease

## 2014-07-15 ENCOUNTER — Inpatient Hospital Stay (HOSPITAL_COMMUNITY)
Admission: EM | Admit: 2014-07-15 | Discharge: 2014-07-18 | DRG: 308 | Disposition: A | Discharge status: Home / Self Care | Payer: Medicare Other | Attending: Internal Medicine | Admitting: Internal Medicine

## 2014-07-15 VITALS — BP 117/71 | HR 138 | Ht 74.0 in | Wt 250.0 lb

## 2014-07-15 DIAGNOSIS — E119 Type 2 diabetes mellitus without complications: Secondary | ICD-10-CM | POA: Diagnosis present

## 2014-07-15 DIAGNOSIS — Z8679 Personal history of other diseases of the circulatory system: Secondary | ICD-10-CM

## 2014-07-15 DIAGNOSIS — Z87891 Personal history of nicotine dependence: Secondary | ICD-10-CM | POA: Diagnosis not present

## 2014-07-15 DIAGNOSIS — Z8249 Family history of ischemic heart disease and other diseases of the circulatory system: Secondary | ICD-10-CM

## 2014-07-15 DIAGNOSIS — I120 Hypertensive chronic kidney disease with stage 5 chronic kidney disease or end stage renal disease: Secondary | ICD-10-CM | POA: Diagnosis present

## 2014-07-15 DIAGNOSIS — M1990 Unspecified osteoarthritis, unspecified site: Secondary | ICD-10-CM | POA: Diagnosis present

## 2014-07-15 DIAGNOSIS — Z79899 Other long term (current) drug therapy: Secondary | ICD-10-CM

## 2014-07-15 DIAGNOSIS — R002 Palpitations: Secondary | ICD-10-CM | POA: Diagnosis present

## 2014-07-15 DIAGNOSIS — I4891 Unspecified atrial fibrillation: Secondary | ICD-10-CM | POA: Diagnosis present

## 2014-07-15 DIAGNOSIS — I4892 Unspecified atrial flutter: Principal | ICD-10-CM | POA: Diagnosis present

## 2014-07-15 DIAGNOSIS — J209 Acute bronchitis, unspecified: Secondary | ICD-10-CM | POA: Diagnosis present

## 2014-07-15 DIAGNOSIS — N2581 Secondary hyperparathyroidism of renal origin: Secondary | ICD-10-CM | POA: Diagnosis present

## 2014-07-15 DIAGNOSIS — R0789 Other chest pain: Secondary | ICD-10-CM

## 2014-07-15 DIAGNOSIS — M549 Dorsalgia, unspecified: Secondary | ICD-10-CM | POA: Diagnosis present

## 2014-07-15 DIAGNOSIS — Z9981 Dependence on supplemental oxygen: Secondary | ICD-10-CM

## 2014-07-15 DIAGNOSIS — Z833 Family history of diabetes mellitus: Secondary | ICD-10-CM | POA: Diagnosis not present

## 2014-07-15 DIAGNOSIS — Z7901 Long term (current) use of anticoagulants: Secondary | ICD-10-CM | POA: Diagnosis not present

## 2014-07-15 DIAGNOSIS — G4730 Sleep apnea, unspecified: Secondary | ICD-10-CM | POA: Diagnosis present

## 2014-07-15 DIAGNOSIS — Z6832 Body mass index (BMI) 32.0-32.9, adult: Secondary | ICD-10-CM

## 2014-07-15 DIAGNOSIS — Z992 Dependence on renal dialysis: Secondary | ICD-10-CM | POA: Diagnosis not present

## 2014-07-15 DIAGNOSIS — E669 Obesity, unspecified: Secondary | ICD-10-CM | POA: Diagnosis present

## 2014-07-15 DIAGNOSIS — D649 Anemia, unspecified: Secondary | ICD-10-CM | POA: Diagnosis present

## 2014-07-15 DIAGNOSIS — G8929 Other chronic pain: Secondary | ICD-10-CM | POA: Diagnosis present

## 2014-07-15 DIAGNOSIS — K219 Gastro-esophageal reflux disease without esophagitis: Secondary | ICD-10-CM | POA: Diagnosis present

## 2014-07-15 DIAGNOSIS — Z9889 Other specified postprocedural states: Secondary | ICD-10-CM

## 2014-07-15 DIAGNOSIS — E1129 Type 2 diabetes mellitus with other diabetic kidney complication: Secondary | ICD-10-CM

## 2014-07-15 DIAGNOSIS — I10 Essential (primary) hypertension: Secondary | ICD-10-CM

## 2014-07-15 DIAGNOSIS — E785 Hyperlipidemia, unspecified: Secondary | ICD-10-CM | POA: Diagnosis present

## 2014-07-15 DIAGNOSIS — Z9289 Personal history of other medical treatment: Secondary | ICD-10-CM

## 2014-07-15 DIAGNOSIS — Z87898 Personal history of other specified conditions: Secondary | ICD-10-CM

## 2014-07-15 DIAGNOSIS — N186 End stage renal disease: Secondary | ICD-10-CM | POA: Diagnosis present

## 2014-07-15 DIAGNOSIS — Z794 Long term (current) use of insulin: Secondary | ICD-10-CM

## 2014-07-15 DIAGNOSIS — E1122 Type 2 diabetes mellitus with diabetic chronic kidney disease: Secondary | ICD-10-CM

## 2014-07-15 DIAGNOSIS — IMO0001 Reserved for inherently not codable concepts without codable children: Secondary | ICD-10-CM | POA: Diagnosis present

## 2014-07-15 LAB — CBC WITH DIFFERENTIAL/PLATELET
Basophils Absolute: 0 10*3/uL (ref 0.0–0.1)
Basophils Relative: 0 % (ref 0–1)
Eosinophils Absolute: 0 10*3/uL (ref 0.0–0.7)
Eosinophils Relative: 0 % (ref 0–5)
HEMATOCRIT: 39.2 % (ref 39.0–52.0)
HEMOGLOBIN: 13 g/dL (ref 13.0–17.0)
LYMPHS ABS: 0.8 10*3/uL (ref 0.7–4.0)
Lymphocytes Relative: 12 % (ref 12–46)
MCH: 31 pg (ref 26.0–34.0)
MCHC: 33.2 g/dL (ref 30.0–36.0)
MCV: 93.6 fL (ref 78.0–100.0)
MONO ABS: 0.8 10*3/uL (ref 0.1–1.0)
Monocytes Relative: 12 % (ref 3–12)
Neutro Abs: 4.7 10*3/uL (ref 1.7–7.7)
Neutrophils Relative %: 76 % (ref 43–77)
Platelets: 214 10*3/uL (ref 150–400)
RBC: 4.19 MIL/uL — AB (ref 4.22–5.81)
RDW: 13.8 % (ref 11.5–15.5)
WBC: 6.2 10*3/uL (ref 4.0–10.5)

## 2014-07-15 LAB — COMPREHENSIVE METABOLIC PANEL
ALT: 16 U/L (ref 0–53)
ANION GAP: 16 — AB (ref 5–15)
AST: 17 U/L (ref 0–37)
Albumin: 3.5 g/dL (ref 3.5–5.2)
Alkaline Phosphatase: 90 U/L (ref 39–117)
BILIRUBIN TOTAL: 0.3 mg/dL (ref 0.3–1.2)
BUN: 21 mg/dL (ref 6–23)
CO2: 28 meq/L (ref 19–32)
CREATININE: 7.58 mg/dL — AB (ref 0.50–1.35)
Calcium: 9 mg/dL (ref 8.4–10.5)
Chloride: 95 mEq/L — ABNORMAL LOW (ref 96–112)
GFR calc Af Amer: 8 mL/min — ABNORMAL LOW (ref >90)
GFR, EST NON AFRICAN AMERICAN: 7 mL/min — AB (ref >90)
Glucose, Bld: 174 mg/dL — ABNORMAL HIGH (ref 70–99)
Potassium: 3.6 mEq/L — ABNORMAL LOW (ref 3.7–5.3)
Sodium: 139 mEq/L (ref 137–147)
Total Protein: 6.9 g/dL (ref 6.0–8.3)

## 2014-07-15 LAB — TROPONIN I: Troponin I: <0.3 ng/mL (ref <0.30)

## 2014-07-15 LAB — PROTIME-INR
INR: 1.4 (ref 0.00–1.49)
PROTHROMBIN TIME: 17.2 s — AB (ref 11.6–15.2)

## 2014-07-15 LAB — MRSA PCR SCREENING: MRSA by PCR: NEGATIVE

## 2014-07-15 LAB — GLUCOSE, CAPILLARY: GLUCOSE-CAPILLARY: 169 mg/dL — AB (ref 70–99)

## 2014-07-15 MED ORDER — WARFARIN - PHYSICIAN DOSING INPATIENT: Freq: Every day | Status: DC

## 2014-07-15 MED ORDER — DILTIAZEM HCL 60 MG PO TABS
60.0000 mg | ORAL_TABLET | Freq: Two times a day (BID) | ORAL | Status: DC
  Administered 2014-07-15 – 2014-07-18 (×5): 60 mg via ORAL
  Filled 2014-07-15 (×5): qty 1

## 2014-07-15 MED ORDER — DILTIAZEM LOAD VIA INFUSION
10.0000 mg | Freq: Once | INTRAVENOUS | Status: AC
  Administered 2014-07-15: 10 mg via INTRAVENOUS
  Filled 2014-07-15: qty 10

## 2014-07-15 MED ORDER — ONDANSETRON HCL 4 MG PO TABS: 4.0000 mg | ORAL_TABLET | Freq: Four times a day (QID) | ORAL | Status: DC | PRN

## 2014-07-15 MED ORDER — ACETAMINOPHEN 325 MG PO TABS: 650.0000 mg | ORAL_TABLET | Freq: Four times a day (QID) | ORAL | Status: DC | PRN

## 2014-07-15 MED ORDER — DOCUSATE SODIUM 100 MG PO CAPS: 200.0000 mg | ORAL_CAPSULE | Freq: Two times a day (BID) | ORAL | Status: DC | PRN

## 2014-07-15 MED ORDER — GABAPENTIN 100 MG PO CAPS
100.0000 mg | ORAL_CAPSULE | Freq: Two times a day (BID) | ORAL | Status: DC
  Administered 2014-07-15 – 2014-07-18 (×5): 100 mg via ORAL
  Filled 2014-07-15 (×5): qty 1

## 2014-07-15 MED ORDER — SODIUM CHLORIDE 0.9 % IJ SOLN
3.0000 mL | Freq: Two times a day (BID) | INTRAMUSCULAR | Status: DC
Start: 2014-07-15 — End: 2014-07-18
  Administered 2014-07-15 – 2014-07-17 (×4): 3 mL via INTRAVENOUS

## 2014-07-15 MED ORDER — DILTIAZEM HCL 30 MG PO TABS: 60.0000 mg | ORAL_TABLET | Freq: Every day | ORAL | Status: DC

## 2014-07-15 MED ORDER — WARFARIN SODIUM 5 MG PO TABS
5.0000 mg | ORAL_TABLET | Freq: Every evening | ORAL | Status: DC
  Administered 2014-07-15: 5 mg via ORAL
  Filled 2014-07-15: qty 1

## 2014-07-15 MED ORDER — CINACALCET HCL 30 MG PO TABS
90.0000 mg | ORAL_TABLET | Freq: Every day | ORAL | Status: DC
  Administered 2014-07-16 – 2014-07-18 (×2): 90 mg via ORAL
  Filled 2014-07-15 (×4): qty 3

## 2014-07-15 MED ORDER — HYDROCODONE-ACETAMINOPHEN 5-325 MG PO TABS
1.0000 | ORAL_TABLET | Freq: Two times a day (BID) | ORAL | Status: DC
  Administered 2014-07-15 – 2014-07-18 (×5): 1 via ORAL
  Filled 2014-07-15 (×5): qty 1

## 2014-07-15 MED ORDER — SEVELAMER CARBONATE 800 MG PO TABS
4800.0000 mg | ORAL_TABLET | Freq: Three times a day (TID) | ORAL | Status: DC
Start: 2014-07-16 — End: 2014-07-18
  Administered 2014-07-16 – 2014-07-18 (×7): 4800 mg via ORAL
  Filled 2014-07-15 (×7): qty 6

## 2014-07-15 MED ORDER — IPRATROPIUM BROMIDE 0.02 % IN SOLN
0.5000 mg | Freq: Four times a day (QID) | RESPIRATORY_TRACT | Status: DC
  Administered 2014-07-15 – 2014-07-16 (×2): 0.5 mg via RESPIRATORY_TRACT
  Filled 2014-07-15 (×2): qty 2.5

## 2014-07-15 MED ORDER — INSULIN GLARGINE 100 UNIT/ML ~~LOC~~ SOLN
22.0000 [IU] | Freq: Every day | SUBCUTANEOUS | Status: DC
  Administered 2014-07-16 – 2014-07-17 (×2): 22 [IU] via SUBCUTANEOUS
  Filled 2014-07-15 (×4): qty 0.22

## 2014-07-15 MED ORDER — ALLOPURINOL 100 MG PO TABS
100.0000 mg | ORAL_TABLET | Freq: Every day | ORAL | Status: DC
  Administered 2014-07-15 – 2014-07-18 (×3): 100 mg via ORAL
  Filled 2014-07-15 (×3): qty 1

## 2014-07-15 MED ORDER — INSULIN ASPART 100 UNIT/ML ~~LOC~~ SOLN: 0.0000 [IU] | Freq: Every day | SUBCUTANEOUS | Status: DC

## 2014-07-15 MED ORDER — GEMFIBROZIL 600 MG PO TABS
600.0000 mg | ORAL_TABLET | Freq: Two times a day (BID) | ORAL | Status: DC
  Administered 2014-07-15 – 2014-07-18 (×5): 600 mg via ORAL
  Filled 2014-07-15 (×6): qty 1

## 2014-07-15 MED ORDER — ALBUTEROL SULFATE HFA 108 (90 BASE) MCG/ACT IN AERS
2.0000 | INHALATION_SPRAY | Freq: Four times a day (QID) | RESPIRATORY_TRACT | Status: DC | PRN
Start: 2014-07-15 — End: 2014-07-15

## 2014-07-15 MED ORDER — PANTOPRAZOLE SODIUM 40 MG PO TBEC
40.0000 mg | DELAYED_RELEASE_TABLET | Freq: Every day | ORAL | Status: DC
  Administered 2014-07-15 – 2014-07-18 (×4): 40 mg via ORAL
  Filled 2014-07-15 (×4): qty 1

## 2014-07-15 MED ORDER — ALBUTEROL SULFATE (2.5 MG/3ML) 0.083% IN NEBU: 2.5000 mg | INHALATION_SOLUTION | Freq: Four times a day (QID) | RESPIRATORY_TRACT | Status: DC | PRN

## 2014-07-15 MED ORDER — DILTIAZEM HCL 100 MG IV SOLR
5.0000 mg/h | INTRAVENOUS | Status: DC
  Administered 2014-07-15: 5 mg/h via INTRAVENOUS
  Filled 2014-07-15: qty 100

## 2014-07-15 MED ORDER — OMEGA-3-ACID ETHYL ESTERS 1 G PO CAPS
1.0000 g | ORAL_CAPSULE | Freq: Three times a day (TID) | ORAL | Status: DC
  Administered 2014-07-15 – 2014-07-18 (×7): 1 g via ORAL
  Filled 2014-07-15 (×7): qty 1

## 2014-07-15 MED ORDER — INSULIN ASPART 100 UNIT/ML ~~LOC~~ SOLN
0.0000 [IU] | Freq: Three times a day (TID) | SUBCUTANEOUS | Status: DC
  Administered 2014-07-16 – 2014-07-17 (×4): 1 [IU] via SUBCUTANEOUS
  Administered 2014-07-18: 3 [IU] via SUBCUTANEOUS
  Administered 2014-07-18: 2 [IU] via SUBCUTANEOUS
  Administered 2014-07-18: 3 [IU] via SUBCUTANEOUS

## 2014-07-15 MED ORDER — DILTIAZEM HCL 30 MG PO TABS: 60.0000 mg | ORAL_TABLET | Freq: Two times a day (BID) | ORAL | Status: DC

## 2014-07-15 MED ORDER — ONDANSETRON HCL 4 MG/2ML IJ SOLN: 4.0000 mg | Freq: Four times a day (QID) | INTRAMUSCULAR | Status: DC | PRN

## 2014-07-15 NOTE — Progress Notes (Signed)
Patient ID: Albert Lewis, male   DOB: [birth year], 63 y.o.   MRN: [id number]      SUBJECTIVE: The patient is here for followup after successfully undergoing cavotricuspid isthmus ablation for right atrial flutter on 06/18/2014 by Dr. Rayann Heman. He was at dialysis today and his heart rate was noted to be in the 104-103 beats per minute range. At the time of hospital discharge, his metoprolol was discontinued. He had previously been taking long-acting diltiazem as well. ECG performed in the office today demonstrates rapid atrial flutter with variable conduction, heart rate 132 beats per minute.  He has experienced mild chest pressure and palpitations since last Friday. He denies dizziness but describes a "head fullness". He also complains of sinus congestion and upper respiratory tract infection since last week.    Allergies  Allergen Reactions  . Actos [Pioglitazone]   . Celebrex [Celecoxib]   . Penicillins Other (See Comments)    Couldn't walk.     Current Outpatient Prescriptions  Medication Sig Dispense Refill  . acetaminophen (TYLENOL) 325 MG tablet Take 650 mg by mouth every 6 (six) hours as needed for moderate pain.      Marland Kitchen albuterol (PROVENTIL HFA;VENTOLIN HFA) 108 (90 BASE) MCG/ACT inhaler Inhale 2 puffs into the lungs every 6 (six) hours as needed for wheezing.  1 Inhaler  2  . albuterol (PROVENTIL) (2.5 MG/3ML) 0.083% nebulizer solution Take 3 mLs (2.5 mg total) by nebulization every 6 (six) hours as needed for wheezing or shortness of breath.  75 mL  0  . allopurinol (ZYLOPRIM) 100 MG tablet Take 100 mg by mouth daily.       . B Complex-C-Folic Acid (RENAL MULTIVITAMIN FORMULA PO) Take 1 tablet by mouth daily.      . cetirizine (ZYRTEC) 10 MG tablet Take 10 mg by mouth daily as needed for allergies.       . cinacalcet (SENSIPAR) 90 MG tablet Take 90 mg by mouth daily.      Marland Kitchen docusate sodium (COLACE) 100 MG capsule Take 200 mg by mouth 2 (two) times daily as needed for moderate  constipation.       . fish oil-omega-3 fatty acids 1000 MG capsule Take 2 g by mouth 3 (three) times daily. SUPER OMEGA 3 FORMULA: EPA 300/DHA200      . gabapentin (NEURONTIN) 100 MG capsule Take 1 capsule (100 mg total) by mouth 2 (two) times daily.  60 capsule  3  . gemfibrozil (LOPID) 600 MG tablet Take 600 mg by mouth 2 (two) times daily.      Marland Kitchen HYDROcodone-acetaminophen (NORCO/VICODIN) 5-325 MG per tablet Take 1 tablet by mouth 2 (two) times daily.      . insulin glargine (LANTUS) 100 UNIT/ML injection Inject 22 Units into the skin at bedtime.       Marland Kitchen ipratropium (ATROVENT) 0.02 % nebulizer solution Take 2.5 mLs (0.5 mg total) by nebulization 4 (four) times daily.  75 mL  0  . pantoprazole (PROTONIX) 40 MG tablet Take 40 mg by mouth daily.      Marland Kitchen PRESCRIPTION MEDICATION Place 1 drop into both eyes 3 (three) times daily. For dry eyes      . sevelamer (RENVELA) 800 MG tablet Take 4,800 mg by mouth 3 (three) times daily with meals. Takes 6 tablets (4800 mg) three times daily with meals and 3 tablets (2400 mg) with snack.      . warfarin (COUMADIN) 5 MG tablet Take 5 mg by mouth every evening. Per Lattie Haw  No current facility-administered medications for this visit.    Past Medical History  Diagnosis Date  . Paroxysmal atrial fibrillation 2/09    Abnormal stress nuclear study in 01/2008; normal coronary angiography  . Hyperlipidemia 2008  . Hypertension 1993    Mild to moderate LVH  . Obesity     sleep apnea treated with BiPAP  . Anemia   . Gastroesophageal reflux disease   . Hyperparathyroidism     Secondary  . Allergic rhinitis   . Lower gastrointestinal bleed 04/2008    diverticulosis; hemorrhoids; constipation  . Diverticular disease   . Chronic back pain     Degenerative disc disease  . ESRD (end stage renal disease) on dialysis     Dialysis since 2007  . Renal insufficiency   . Sleep apnea 2010    CPAP followed at [place]  . Cataract     s/p extraction x 2  . Diabetes  mellitus, type 2 1993    no insulin  . Atrial flutter     Past Surgical History  Procedure Laterality Date  . Cataract extraction w/ intraocular lens  implant, bilateral  2008  . Lumbar peritoneal shunt    . Exploratory laparotomy      shunt complications  . Back surgery    . Cholecystectomy  1999  . Spine surgery      twice in lower back    History   Social History  . Marital Status: Divorced    Spouse Name: N/A    Number of Children: 2  . Years of Education: N/A   Occupational History  . Not on file.   Social History Main Topics  . Smoking status: Former Smoker    Types: Cigarettes  . Smokeless tobacco: Never Used  . Alcohol Use: No  . Drug Use: No  . Sexual Activity: Yes   Other Topics Concern  . Not on file   Social History Narrative   Married lives with spouse since 34. Patient has 11 siblings .    BP 117/71  Pulse 138   PHYSICAL EXAM General: NAD Neck: No JVD, no thyromegaly. Lungs: Bilateral wheezes and rhonchi, no rales. CV: Nondisplaced PMI.  Tachycardic, mostly regular rhythm, normal S1/S2, no S3, no murmur. No pretibial or periankle edema.  No carotid bruit.   Abdomen: Soft, nontender,obese, no distention.  Neurologic: Alert and oriented x 3.  Psych: Normal affect. Extremities: No clubbing or cyanosis.   ECG: reviewed and available in electronic records.      ASSESSMENT AND PLAN: 1. Atrial flutter with RVR s/p ablation on 06/18/14: He has a recurrence of atrial flutter. I will start diltiazem 60 mg bid. He has been prescribed 30 mg daily but has not taken any today. I will have him go to the [facility] ED (friend will take him) where he can be cardioverted, and thus potentially avoid hospitalization as he has done in the past. Continue warfarin (supratherapeutic on 7/21, 3.5). Will also have him f/u with Dr. Rayann Heman (EP) within the next 1-2 weeks.  2. Essential hypertension: Controlled today. Will need to monitor for lower BP given  institution of diltiazem.  Dispo: f/u with me this Thursday, 7/30.  Kate Sable, M.D., F.A.C.C.

## 2014-07-15 NOTE — Patient Instructions (Addendum)
   Increase Diltiazem to 60mg  twice a day  - may take 2 tabs of your 30 mg tablet twice a day for now  Continue all other medications.   Friend transporting to [facility] ED for possible Cardioversion Dr. Rayann Heman follow up as soon as possible Follow up this Thursday

## 2014-07-15 NOTE — ED Provider Notes (Signed)
CSN: [id number]     Arrival date & time 07/15/14  1600 History  This chart was scribed for Doree Albee, MD,  by Stacy Gardner, ED Scribe. The patient was seen in room APA04/APA04 and the patient's care was started at 4:10 PM.   First MD Initiated Contact with Patient 07/15/14 1610     Chief Complaint  Patient presents with  . Tachycardia     (Consider location/radiation/quality/duration/timing/severity/associated sxs/prior Treatment) The history is provided by the patient and medical records. No language interpreter was used.   HPI Comments: Albert Lewis is a 63 y.o. male  with a medical hx of HTN and DM, presents to the Emergency Department complaining of constant, worsening tachycardia, onset three days ago and worse today during his dialysis treatment. Pt was told by his cardiologist Dr. Bronson Ing.to come to the ED today to have another cardioversion performed due to atrial flutter present on his EKG. He had his initial and first cardioversion via sedation performed 05/14/14 by Dr. Christy Gentles at [facility] ED. He mentions having the same symptoms as the prior episode which includes: chest tightness, palpitations, and SOB. Pt usually takes coumadin 5 mg daily around 5 pm and he did not take any today. Pt denies missing any of his dialysis treatments and his dialysis treatment to day was completed. Pt denies dizziness, light headedness, abdominal pain, nausea, and vomiting. Denies hx of stroke. Pt's PCP is Dr. Tula Nakayama  Past Medical History  Diagnosis Date  . Paroxysmal atrial fibrillation 2/09    Abnormal stress nuclear study in 01/2008; normal coronary angiography  . Hyperlipidemia 2008  . Hypertension 1993    Mild to moderate LVH  . Obesity     sleep apnea treated with BiPAP  . Anemia   . Gastroesophageal reflux disease   . Hyperparathyroidism     Secondary  . Allergic rhinitis   . Lower gastrointestinal bleed 04/2008    diverticulosis; hemorrhoids; constipation  .  Diverticular disease   . Chronic back pain     Degenerative disc disease  . ESRD (end stage renal disease) on dialysis     Dialysis since 2007  . Renal insufficiency   . Sleep apnea 2010    CPAP followed at [place]  . Cataract     s/p extraction x 2  . Diabetes mellitus, type 2 1993    no insulin  . Atrial flutter    Past Surgical History  Procedure Laterality Date  . Cataract extraction w/ intraocular lens  implant, bilateral  2008  . Lumbar peritoneal shunt    . Exploratory laparotomy      shunt complications  . Back surgery    . Cholecystectomy  1999  . Spine surgery      twice in lower back   Family History  Problem Relation Age of Onset  . Hypertension Mother   . Cancer Mother 29    breast   . Hypertension Sister 35  . Hypertension Daughter 68  . Diabetes Daughter    History  Substance Use Topics  . Smoking status: Former Smoker    Types: Cigarettes  . Smokeless tobacco: Never Used  . Alcohol Use: No    Review of Systems  Respiratory: Positive for chest tightness and shortness of breath.   Cardiovascular: Positive for palpitations.  Gastrointestinal: Negative for nausea, vomiting and abdominal pain.  Neurological: Negative for dizziness and light-headedness.  All other systems reviewed and are negative.     Allergies  Actos; Celebrex; and  Penicillins  Home Medications   Prior to Admission medications   Medication Sig Start Date End Date Taking? Authorizing Provider  acetaminophen (TYLENOL) 325 MG tablet Take 650 mg by mouth every 6 (six) hours as needed for moderate pain.   Yes Historical Provider, MD  albuterol (PROVENTIL HFA;VENTOLIN HFA) 108 (90 BASE) MCG/ACT inhaler Inhale 2 puffs into the lungs every 6 (six) hours as needed for wheezing. 10/19/12  Yes Alycia Rossetti, MD  albuterol (PROVENTIL) (2.5 MG/3ML) 0.083% nebulizer solution Take 3 mLs (2.5 mg total) by nebulization every 6 (six) hours as needed for wheezing or shortness of breath. 10/30/13   Yes Fayrene Helper, MD  allopurinol (ZYLOPRIM) 100 MG tablet Take 100 mg by mouth daily.    Yes Historical Provider, MD  B Complex-C-Folic Acid (RENAL MULTIVITAMIN FORMULA PO) Take 1 tablet by mouth daily.   Yes Historical Provider, MD  cinacalcet (SENSIPAR) 90 MG tablet Take 90 mg by mouth daily.   Yes Historical Provider, MD  diltiazem (CARDIZEM) 30 MG tablet Take 2 tablets (60 mg total) by mouth 2 (two) times daily. 07/15/14  Yes Herminio Commons, MD  docusate sodium (COLACE) 100 MG capsule Take 200 mg by mouth 2 (two) times daily as needed for moderate constipation.    Yes Historical Provider, MD  fish oil-omega-3 fatty acids 1000 MG capsule Take 2 g by mouth 3 (three) times daily. SUPER OMEGA 3 FORMULA: EPA 300/DHA200   Yes Historical Provider, MD  gabapentin (NEURONTIN) 100 MG capsule Take 1 capsule (100 mg total) by mouth 2 (two) times daily. 01/29/14  Yes Fayrene Helper, MD  gemfibrozil (LOPID) 600 MG tablet Take 600 mg by mouth 2 (two) times daily.   Yes Historical Provider, MD  HYDROcodone-acetaminophen (NORCO/VICODIN) 5-325 MG per tablet Take 1 tablet by mouth 2 (two) times daily.   Yes Historical Provider, MD  insulin glargine (LANTUS) 100 UNIT/ML injection Inject 22 Units into the skin at bedtime.    Yes Historical Provider, MD  ipratropium (ATROVENT) 0.02 % nebulizer solution Take 2.5 mLs (0.5 mg total) by nebulization 4 (four) times daily. 10/30/13  Yes Fayrene Helper, MD  pantoprazole (PROTONIX) 40 MG tablet Take 40 mg by mouth daily.   Yes Historical Provider, MD  PRESCRIPTION MEDICATION Place 1 drop into both eyes 3 (three) times daily. For dry eyes   Yes Historical Provider, MD  sevelamer (RENVELA) 800 MG tablet Take 4,800 mg by mouth 3 (three) times daily with meals. Takes 6 tablets (4800 mg) three times daily with meals and 3 tablets (2400 mg) with snack.   Yes Historical Provider, MD  warfarin (COUMADIN) 5 MG tablet Take 5 mg by mouth every evening. Per Lenice Pressman  Historical Provider, MD   BP 152/96  Pulse 139  Temp(Src) 98.3 F (36.8 C) (Oral)  Resp 15  Ht 6\' 2"  (1.88 m)  Wt 250 lb (113.399 kg)  BMI 32.08 kg/m2  SpO2 98% Physical Exam  Nursing note and vitals reviewed. Constitutional: He is oriented to person, place, and time. He appears well-developed and well-nourished. No distress.  HENT:  Head: Normocephalic and atraumatic.  Mouth/Throat: Oropharynx is clear and moist. No oropharyngeal exudate.  Eyes: Conjunctivae and EOM are normal. Pupils are equal, round, and reactive to light.  Neck: Normal range of motion. Neck supple.  No meningismus.  Cardiovascular: Normal rate, normal heart sounds and intact distal pulses.   No murmur heard. Irregular tachycardic rhythm Fistula placed on the left arm with  thrill and bruits  Pulmonary/Chest: Effort normal and breath sounds normal. No respiratory distress. He has no wheezes. He has no rales.  Abdominal: Soft. There is no tenderness. There is no rebound and no guarding.  Musculoskeletal: Normal range of motion. He exhibits no edema and no tenderness.  Neurological: He is alert and oriented to person, place, and time. No cranial nerve deficit. He exhibits normal muscle tone. Coordination normal.  No ataxia on finger to nose bilaterally. No pronator drift. 5/5 strength throughout. CN 2-12 intact. Negative Romberg. Equal grip strength. Sensation intact. Gait is normal.   Skin: Skin is warm.  Psychiatric: He has a normal mood and affect. His behavior is normal.    ED Course  Procedures (including critical care time) DIAGNOSTIC STUDIES: Oxygen Saturation is 96% on RA, normal by my interpretation.    COORDINATION OF CARE:  4:14 PM Discussed course of care with pt which includes chest x-ray, EKG, and laboratory tests . Pt understands and agrees.  Labs Review Labs Reviewed  CBC WITH DIFFERENTIAL - Abnormal; Notable for the following:    RBC 4.19 (*)    All other components within normal limits   COMPREHENSIVE METABOLIC PANEL - Abnormal; Notable for the following:    Potassium 3.6 (*)    Chloride 95 (*)    Glucose, Bld 174 (*)    Creatinine, Ser 7.58 (*)    GFR calc non Af Amer 7 (*)    GFR calc Af Amer 8 (*)    Anion gap 16 (*)    All other components within normal limits  PROTIME-INR - Abnormal; Notable for the following:    Prothrombin Time 17.2 (*)    All other components within normal limits    Imaging Review Dg Chest Portable 1 View  07/15/2014   CLINICAL DATA:  Cough and congestion.  EXAM: PORTABLE CHEST - 1 VIEW  COMPARISON:  Single view of the chest 07/03/2014 and 06/17/2014. CT chest 05/06/2014.  FINDINGS: Lungs are clear. There is mild cardiomegaly. No pneumothorax or pleural effusion.  IMPRESSION: Mild cardiomegaly without acute disease.   Electronically Signed   By: Inge Rise M.D.   On: 07/15/2014 16:38     EKG Interpretation   Date/Time:  Monday July 15 2014 16:18:11 EDT Ventricular Rate:  120 PR Interval:    QRS Duration: 104 QT Interval:  342 QTC Calculation: 483 R Axis:   61 Text Interpretation:  Atrial flutter with 2:1 AV block Nonspecific repol  abnormality, lateral leads No significant change was found Confirmed by  Wyvonnia Dusky  MD, Zyen Triggs ([id number]) on 07/15/2014 5:08:24 PM      MDM   Final diagnoses:  Atrial flutter with rapid ventricular response  ESRD (end stage renal disease)   Patient from cardiology office with atrial flutter with RVR and palpitations. Denies any chest pain or shortness of breath. Endorses chest tightness. Symptoms have been ongoing for the past 3 days but became worse today. On Coumadin. He's had multiple cardioversions in the past including an ablation on June 30.  Likely rate related ST changes on EKG.  D/w Dr. Domenic Polite as Dr. Bronson Ing is not available.  He agrees if INR therapeutic, ok to proceed with cardioversion.  INR 1.4. Unsafe to cardiovert. Palpitations have been ongoing for the past 3 days. We'll start  Cardizem drip and admit.  CRITICAL CARE Performed by: Ezequiel Essex Total critical care time: 30 Critical care time was exclusive of separately billable procedures and treating other patients. Critical care was necessary  to treat or prevent imminent or life-threatening deterioration. Critical care was time spent personally by me on the following activities: development of treatment plan with patient and/or surrogate as well as nursing, discussions with consultants, evaluation of patient's response to treatment, examination of patient, obtaining history from patient or surrogate, ordering and performing treatments and interventions, ordering and review of laboratory studies, ordering and review of radiographic studies, pulse oximetry and re-evaluation of patient's condition.   I personally performed the services described in this documentation, which was scribed in my presence. The recorded information has been reviewed and is accurate.   Ezequiel Essex, MD 07/15/14 [phone]

## 2014-07-15 NOTE — Progress Notes (Signed)
ANTICOAGULATION CONSULT NOTE - Initial Consult  Pharmacy Consult for Warfarin Indication: atrial fibrillation  Allergies  Allergen Reactions  . Actos [Pioglitazone]   . Celebrex [Celecoxib]   . Penicillins Other (See Comments)    Couldn't walk.     Patient Measurements: Height: 6\' 2"  (188 cm) Weight: 250 lb (113.399 kg) IBW/kg (Calculated) : 82.2 Heparin Dosing Weight:  Vital Signs: Temp: 98.4 F (36.9 C) (07/27 1900) Temp src: Oral (07/27 1900) BP: 150/28 mmHg (07/27 2000) Pulse Rate: 34 (07/27 2030)  Labs:  Recent Labs  07/15/14 1658  HGB 13.0  HCT 39.2  PLT 214  LABPROT 17.2*  INR 1.40  CREATININE 7.58*    Estimated Creatinine Clearance: 13.4 ml/min (by C-G formula based on Cr of 7.58).   Medical History: Past Medical History  Diagnosis Date  . Paroxysmal atrial fibrillation 2/09    Abnormal stress nuclear study in 01/2008; normal coronary angiography  . Hyperlipidemia 2008  . Hypertension 1993    Mild to moderate LVH  . Obesity     sleep apnea treated with BiPAP  . Anemia   . Gastroesophageal reflux disease   . Hyperparathyroidism     Secondary  . Allergic rhinitis   . Lower gastrointestinal bleed 04/2008    diverticulosis; hemorrhoids; constipation  . Diverticular disease   . Chronic back pain     Degenerative disc disease  . ESRD (end stage renal disease) on dialysis     Dialysis since 2007  . Renal insufficiency   . Sleep apnea 2010    CPAP followed at [place]  . Cataract     s/p extraction x 2  . Diabetes mellitus, type 2 1993    no insulin  . Atrial flutter     Medications:  Scheduled:  . allopurinol  100 mg Oral Daily  . [START ON 07/16/2014] cinacalcet  90 mg Oral Q breakfast  . diltiazem  60 mg Oral BID  . gabapentin  100 mg Oral BID  . gemfibrozil  600 mg Oral BID AC  . HYDROcodone-acetaminophen  1 tablet Oral BID  . insulin aspart  0-5 Units Subcutaneous QHS  . [START ON 07/16/2014] insulin aspart  0-9 Units Subcutaneous TID WC   . insulin glargine  22 Units Subcutaneous QHS  . ipratropium  0.5 mg Nebulization QID  . omega-3 acid ethyl esters  1 g Oral TID  . pantoprazole  40 mg Oral Daily  . [START ON 07/16/2014] sevelamer carbonate  4,800 mg Oral TID WC  . sodium chloride  3 mL Intravenous Q12H  . warfarin  5 mg Oral QPM  . [START ON 07/16/2014] Warfarin - Physician Dosing Inpatient   Does not apply q1800    Assessment: History of AFIB Admitted atrial flutter  ESRD INR  subtherapeutic on admission MD ordered Coumadin 5 mg daily on admission, patient home dose Pharmacy consult to manage coumadin  Goal of Therapy:  INR 2-3 Monitor platelets by anticoagulation protocol: Yes   Plan:  Continue Coumadin 5 mg daily as ordered by MD INR/PT daily Labs per protocol  Albert Lewis, Albert Lewis 07/15/2014,9:06 PM

## 2014-07-15 NOTE — H&P (Signed)
[facility] History and Physical  CARDARIUS SENAT CLE:[id number] DOB: 1951/07/01 DOA: 07/15/2014  Referring physician: ER. PCP: Tula Nakayama, MD   Chief Complaint: Palpitations.  HPI: Albert Lewis is a 63 y.o. male  This is a 63 year old man, who has a history of end-stage renal disease on hemodialysis, and will also has a history of atrial fibrillation and more recently an admission with atrial flutter, who now presents once again with a three-day history of intermittent palpitations. He underwent dialysis today and he was noted to have increased ventricular rate. He was sent to the cardiology and an echocardiogram showed atrial flutter. He was therefore sent to the emergency room with a possibility of cardioversion. He is now being admitted for further management.   Review of Systems:  Constitutional:  No weight loss, night sweats, Fevers, chills, fatigue.  HEENT:  No headaches, Difficulty swallowing,Tooth/dental problems,Sore throat,  No sneezing, itching, ear ache, nasal congestion, post nasal drip,  Cardio-vascular:  No chest pain, Orthopnea, PND, swelling in lower extremities, anasarca, dizziness GI:  No heartburn, indigestion, abdominal pain, nausea, vomiting, diarrhea, change in bowel habits, loss of appetite  Resp:  No shortness of breath with exertion or at rest. No excess mucus, no productive cough, No non-productive cough, No coughing up of blood.No change in color of mucus.No wheezing.No chest wall deformity  Skin:  no rash or lesions.  GU:  no dysuria, change in color of urine, no urgency or frequency. No flank pain.  Musculoskeletal:  No joint pain or swelling. No decreased range of motion. No back pain.  Psych:  No change in mood or affect. No depression or anxiety. No memory loss.   Past Medical History  Diagnosis Date  . Paroxysmal atrial fibrillation 2/09    Abnormal stress nuclear study in 01/2008; normal coronary angiography  . Hyperlipidemia  2008  . Hypertension 1993    Mild to moderate LVH  . Obesity     sleep apnea treated with BiPAP  . Anemia   . Gastroesophageal reflux disease   . Hyperparathyroidism     Secondary  . Allergic rhinitis   . Lower gastrointestinal bleed 04/2008    diverticulosis; hemorrhoids; constipation  . Diverticular disease   . Chronic back pain     Degenerative disc disease  . ESRD (end stage renal disease) on dialysis     Dialysis since 2007  . Renal insufficiency   . Sleep apnea 2010    CPAP followed at [place]  . Cataract     s/p extraction x 2  . Diabetes mellitus, type 2 1993    no insulin  . Atrial flutter    Past Surgical History  Procedure Laterality Date  . Cataract extraction w/ intraocular lens  implant, bilateral  2008  . Lumbar peritoneal shunt    . Exploratory laparotomy      shunt complications  . Back surgery    . Cholecystectomy  1999  . Spine surgery      twice in lower back   Social History:  reports that he has quit smoking. His smoking use included Cigarettes. He smoked 0.00 packs per day. He has never used smokeless tobacco. He reports that he does not drink alcohol or use illicit drugs.  Allergies  Allergen Reactions  . Actos [Pioglitazone]   . Celebrex [Celecoxib]   . Penicillins Other (See Comments)    Couldn't walk.     Family History  Problem Relation Age of Onset  . Hypertension Mother   .  Cancer Mother 57    breast   . Hypertension Sister 56  . Hypertension Daughter 49  . Diabetes Daughter      Prior to Admission medications   Medication Sig Start Date End Date Taking? Authorizing Provider  acetaminophen (TYLENOL) 325 MG tablet Take 650 mg by mouth every 6 (six) hours as needed for moderate pain.   Yes Historical Provider, MD  albuterol (PROVENTIL HFA;VENTOLIN HFA) 108 (90 BASE) MCG/ACT inhaler Inhale 2 puffs into the lungs every 6 (six) hours as needed for wheezing. 10/19/12  Yes Alycia Rossetti, MD  albuterol (PROVENTIL) (2.5 MG/3ML) 0.083%  nebulizer solution Take 3 mLs (2.5 mg total) by nebulization every 6 (six) hours as needed for wheezing or shortness of breath. 10/30/13  Yes Fayrene Helper, MD  allopurinol (ZYLOPRIM) 100 MG tablet Take 100 mg by mouth daily.    Yes Historical Provider, MD  B Complex-C-Folic Acid (RENAL MULTIVITAMIN FORMULA PO) Take 1 tablet by mouth daily.   Yes Historical Provider, MD  cinacalcet (SENSIPAR) 90 MG tablet Take 90 mg by mouth daily.   Yes Historical Provider, MD  diltiazem (CARDIZEM) 30 MG tablet Take 2 tablets (60 mg total) by mouth 2 (two) times daily. 07/15/14  Yes Herminio Commons, MD  docusate sodium (COLACE) 100 MG capsule Take 200 mg by mouth 2 (two) times daily as needed for moderate constipation.    Yes Historical Provider, MD  fish oil-omega-3 fatty acids 1000 MG capsule Take 2 g by mouth 3 (three) times daily. SUPER OMEGA 3 FORMULA: EPA 300/DHA200   Yes Historical Provider, MD  gabapentin (NEURONTIN) 100 MG capsule Take 1 capsule (100 mg total) by mouth 2 (two) times daily. 01/29/14  Yes Fayrene Helper, MD  gemfibrozil (LOPID) 600 MG tablet Take 600 mg by mouth 2 (two) times daily.   Yes Historical Provider, MD  HYDROcodone-acetaminophen (NORCO/VICODIN) 5-325 MG per tablet Take 1 tablet by mouth 2 (two) times daily.   Yes Historical Provider, MD  insulin glargine (LANTUS) 100 UNIT/ML injection Inject 22 Units into the skin at bedtime.    Yes Historical Provider, MD  ipratropium (ATROVENT) 0.02 % nebulizer solution Take 2.5 mLs (0.5 mg total) by nebulization 4 (four) times daily. 10/30/13  Yes Fayrene Helper, MD  pantoprazole (PROTONIX) 40 MG tablet Take 40 mg by mouth daily.   Yes Historical Provider, MD  PRESCRIPTION MEDICATION Place 1 drop into both eyes 3 (three) times daily. For dry eyes   Yes Historical Provider, MD  sevelamer (RENVELA) 800 MG tablet Take 4,800 mg by mouth 3 (three) times daily with meals. Takes 6 tablets (4800 mg) three times daily with meals and 3 tablets  (2400 mg) with snack.   Yes Historical Provider, MD  warfarin (COUMADIN) 5 MG tablet Take 5 mg by mouth every evening. Per Lenice Pressman Historical Provider, MD   Physical Exam: Filed Vitals:   07/15/14 1808 07/15/14 1828 07/15/14 1900 07/15/14 1945  BP: 152/96 155/66  174/86  Pulse:  104  67  Temp:   98.4 F (36.9 C)   TempSrc:   Oral   Resp:    19  Height:      Weight:      SpO2:        Wt Readings from Last 3 Encounters:  07/15/14 113.399 kg (250 lb)  07/15/14 113.399 kg (250 lb)  06/19/14 112.2 kg (247 lb 5.7 oz)    General:  Appears calm and comfortable Eyes: PERRL, normal  lids, irises & conjunctiva ENT: grossly normal hearing, lips & tongue Neck: no LAD, masses or thyromegaly Cardiovascular irregular. Telemetry: Atrial flutter with 2 to one block. Respiratory: Bilateral scattered wheezing, mild. Abdomen: soft, ntnd Skin: no rash or induration seen on limited exam Musculoskeletal: grossly normal tone BUE/BLE Psychiatric: grossly normal mood and affect, speech fluent and appropriate Neurologic: grossly non-focal.          Labs on Admission:  Basic Metabolic Panel:  Recent Labs Lab 07/15/14 1658  NA 139  K 3.6*  CL 95*  CO2 28  GLUCOSE 174*  BUN 21  CREATININE 7.58*  CALCIUM 9.0   Liver Function Tests:  Recent Labs Lab 07/15/14 1658  AST 17  ALT 16  ALKPHOS 90  BILITOT 0.3  PROT 6.9  ALBUMIN 3.5   No results found for this basename: LIPASE, AMYLASE,  in the last 168 hours No results found for this basename: AMMONIA,  in the last 168 hours CBC:  Recent Labs Lab 07/15/14 1658  WBC 6.2  NEUTROABS 4.7  HGB 13.0  HCT 39.2  MCV 93.6  PLT 214   Cardiac Enzymes: No results found for this basename: CKTOTAL, CKMB, CKMBINDEX, TROPONINI,  in the last 168 hours  BNP (last 3 results)  Recent Labs  06/17/14 1940  PROBNP 2925.0*   CBG: No results found for this basename: GLUCAP,  in the last 168 hours  Radiological Exams on Admission: Dg  Chest Portable 1 View  07/15/2014   CLINICAL DATA:  Cough and congestion.  EXAM: PORTABLE CHEST - 1 VIEW  COMPARISON:  Single view of the chest 07/03/2014 and 06/17/2014. CT chest 05/06/2014.  FINDINGS: Lungs are clear. There is mild cardiomegaly. No pneumothorax or pleural effusion.  IMPRESSION: Mild cardiomegaly without acute disease.   Electronically Signed   By: Inge Rise M.D.   On: 07/15/2014 16:38    EKG: Independently reviewed. Atrial flutter with 2 to one block.  Assessment/Plan   1. Atrial flutter with 2 to one block, ventricular rate increased. On Coumadin. 2. End-stage renal disease on hemodialysis. 3. Hypertension. 4. Diabetes. 5. Obesity.  Plan: 1. Admit to step down unit . 2. Cardizem drip. 2. Serial cardiac enzymes. 4. Cardiology consultation. He may need cardioversion again.  Further recommendations will depend on patient's hospital progress.   Code Status: Full code  DVT Prophylaxis: On full anticoagulation.  Family Communication: I discussed the plan with the patient at the bedside.  Disposition Plan: Home when medically stable.  Time spent: 60 minutes.  Doree Albee [facility] Pager [phone].  **Disclaimer: This note may have been dictated with voice recognition software. Similar sounding words can inadvertently be transcribed and this note may contain transcription errors which may not have been corrected upon publication of note.**

## 2014-07-15 NOTE — ED Notes (Signed)
Pt. C/o tachycardia during dialysis, seen by his dr. After dialysis and was told to come here, c/o chest tightness, denies SOB

## 2014-07-16 DIAGNOSIS — I10 Essential (primary) hypertension: Secondary | ICD-10-CM

## 2014-07-16 DIAGNOSIS — R0789 Other chest pain: Secondary | ICD-10-CM

## 2014-07-16 LAB — GLUCOSE, CAPILLARY
GLUCOSE-CAPILLARY: 128 mg/dL — AB (ref 70–99)
GLUCOSE-CAPILLARY: 152 mg/dL — AB (ref 70–99)
Glucose-Capillary: 93 mg/dL (ref 70–99)
Glucose-Capillary: 143 mg/dL — ABNORMAL HIGH (ref 70–99)

## 2014-07-16 LAB — CBC
HCT: 38.9 % — ABNORMAL LOW (ref 39.0–52.0)
Hemoglobin: 13 g/dL (ref 13.0–17.0)
MCH: 31.6 pg (ref 26.0–34.0)
MCHC: 33.4 g/dL (ref 30.0–36.0)
MCV: 94.4 fL (ref 78.0–100.0)
Platelets: 191 10*3/uL (ref 150–400)
RBC: 4.12 MIL/uL — ABNORMAL LOW (ref 4.22–5.81)
RDW: 13.9 % (ref 11.5–15.5)
WBC: 5.3 10*3/uL (ref 4.0–10.5)

## 2014-07-16 LAB — COMPREHENSIVE METABOLIC PANEL
ALT: 14 U/L (ref 0–53)
AST: 16 U/L (ref 0–37)
Albumin: 3.4 g/dL — ABNORMAL LOW (ref 3.5–5.2)
Alkaline Phosphatase: 85 U/L (ref 39–117)
Anion gap: 16 — ABNORMAL HIGH (ref 5–15)
BILIRUBIN TOTAL: 0.5 mg/dL (ref 0.3–1.2)
BUN: 23 mg/dL (ref 6–23)
CHLORIDE: 94 meq/L — AB (ref 96–112)
CO2: 28 mEq/L (ref 19–32)
Calcium: 8.9 mg/dL (ref 8.4–10.5)
Creatinine, Ser: 8.44 mg/dL — ABNORMAL HIGH (ref 0.50–1.35)
GFR, EST AFRICAN AMERICAN: 7 mL/min — AB (ref >90)
GFR, EST NON AFRICAN AMERICAN: 6 mL/min — AB (ref >90)
Glucose, Bld: 114 mg/dL — ABNORMAL HIGH (ref 70–99)
Potassium: 4 mEq/L (ref 3.7–5.3)
Sodium: 138 mEq/L (ref 137–147)
Total Protein: 6.9 g/dL (ref 6.0–8.3)

## 2014-07-16 LAB — TROPONIN I
Troponin I: <0.3 ng/mL (ref <0.30)
Troponin I: <0.3 ng/mL (ref <0.30)

## 2014-07-16 LAB — PRO B NATRIURETIC PEPTIDE: PRO B NATRI PEPTIDE: 9235 pg/mL — AB (ref 0–125)

## 2014-07-16 LAB — PROTIME-INR
INR: 1.48 (ref 0.00–1.49)
PROTHROMBIN TIME: 17.9 s — AB (ref 11.6–15.2)

## 2014-07-16 LAB — TSH: TSH: 1.26 u[IU]/mL (ref 0.350–4.500)

## 2014-07-16 MED ORDER — WARFARIN SODIUM 7.5 MG PO TABS
7.5000 mg | ORAL_TABLET | Freq: Once | ORAL | Status: AC
  Administered 2014-07-16: 7.5 mg via ORAL
  Filled 2014-07-16: qty 1

## 2014-07-16 MED ORDER — GUAIFENESIN ER 600 MG PO TB12
1200.0000 mg | ORAL_TABLET | Freq: Two times a day (BID) | ORAL | Status: DC
Start: 2014-07-16 — End: 2014-07-18
  Administered 2014-07-16 – 2014-07-18 (×4): 1200 mg via ORAL
  Filled 2014-07-16 (×4): qty 2

## 2014-07-16 MED ORDER — IPRATROPIUM-ALBUTEROL 0.5-2.5 (3) MG/3ML IN SOLN
3.0000 mL | Freq: Four times a day (QID) | RESPIRATORY_TRACT | Status: DC
  Administered 2014-07-16 – 2014-07-18 (×9): 3 mL via RESPIRATORY_TRACT
  Filled 2014-07-16 (×9): qty 3

## 2014-07-16 MED ORDER — WARFARIN - PHARMACIST DOSING INPATIENT
Status: DC
  Administered 2014-07-16 – 2014-07-17 (×2)

## 2014-07-16 NOTE — Consult Note (Signed)
Reason for Consult: End-stage renal disease Referring Physician: Dr. Rush Lewis is an 63 y.o. male.  HPI: Is a patient was history of hypertension, a trial fibrillation/atrial flutter and history of end-stage renal disease on maintenance hemodialysis presently was brought because of uncontrolled heart rate. Patient was on dialysis yesterday completed his treatment. He went to see cardiologist he him he was found to have atrial flutter hence admitted to the hospital for better control. Patient was on Cardizem before however because of severe hypertension does of Cardizem was decreased to 30 mg once a day. Until yesterday patient was not able to get his medication he is was noted any other medications. Presently patient denies any difficulty breathing and feeling okay. Patient does not have any nausea vomiting.  Past Medical History  Diagnosis Date  . Paroxysmal atrial fibrillation 2/09    Abnormal stress nuclear study in 01/2008; normal coronary angiography  . Hyperlipidemia 2008  . Hypertension 1993    Mild to moderate LVH  . Obesity     sleep apnea treated with BiPAP  . Anemia   . Gastroesophageal reflux disease   . Hyperparathyroidism     Secondary  . Allergic rhinitis   . Lower gastrointestinal bleed 04/2008    diverticulosis; hemorrhoids; constipation  . Diverticular disease   . Chronic back pain     Degenerative disc disease  . ESRD (end stage renal disease) on dialysis     Dialysis since 2007  . Renal insufficiency   . Sleep apnea 2010    CPAP followed at [place]  . Cataract     s/p extraction x 2  . Diabetes mellitus, type 2 1993    no insulin  . Atrial flutter     Past Surgical History  Procedure Laterality Date  . Cataract extraction w/ intraocular lens  implant, bilateral  2008  . Lumbar peritoneal shunt    . Exploratory laparotomy      shunt complications  . Back surgery    . Cholecystectomy  1999  . Spine surgery      twice in lower back    Family  History  Problem Relation Age of Onset  . Hypertension Mother   . Cancer Mother 62    breast   . Hypertension Sister 77  . Hypertension Daughter 42  . Diabetes Daughter     Social History:  reports that he has quit smoking. His smoking use included Cigarettes. He smoked 0.00 packs per day. He has never used smokeless tobacco. He reports that he does not drink alcohol or use illicit drugs.  Allergies:  Allergies  Allergen Reactions  . Actos [Pioglitazone]   . Celebrex [Celecoxib]   . Penicillins Other (See Comments)    Couldn't walk.     Medications: I have reviewed the patient's current medications.  Results for orders placed during the hospital encounter of 07/15/14 (from the past 48 hour(s))  CBC WITH DIFFERENTIAL     Status: Abnormal   Collection Time    07/15/14  4:58 PM      Result Value Ref Range   WBC 6.2  4.0 - 10.5 K/uL   RBC 4.19 (*) 4.22 - 5.81 MIL/uL   Hemoglobin 13.0  13.0 - 17.0 g/dL   HCT 39.2  39.0 - 52.0 %   MCV 93.6  78.0 - 100.0 fL   MCH 31.0  26.0 - 34.0 pg   MCHC 33.2  30.0 - 36.0 g/dL   RDW 13.8  11.5 -  15.5 %   Platelets 214  150 - 400 K/uL   Neutrophils Relative % 76  43 - 77 %   Neutro Abs 4.7  1.7 - 7.7 K/uL   Lymphocytes Relative 12  12 - 46 %   Lymphs Abs 0.8  0.7 - 4.0 K/uL   Monocytes Relative 12  3 - 12 %   Monocytes Absolute 0.8  0.1 - 1.0 K/uL   Eosinophils Relative 0  0 - 5 %   Eosinophils Absolute 0.0  0.0 - 0.7 K/uL   Basophils Relative 0  0 - 1 %   Basophils Absolute 0.0  0.0 - 0.1 K/uL  COMPREHENSIVE METABOLIC PANEL     Status: Abnormal   Collection Time    07/15/14  4:58 PM      Result Value Ref Range   Sodium 139  137 - 147 mEq/L   Potassium 3.6 (*) 3.7 - 5.3 mEq/L   Chloride 95 (*) 96 - 112 mEq/L   CO2 28  19 - 32 mEq/L   Glucose, Bld 174 (*) 70 - 99 mg/dL   BUN 21  6 - 23 mg/dL   Creatinine, Ser 7.58 (*) 0.50 - 1.35 mg/dL   Calcium 9.0  8.4 - 10.5 mg/dL   Total Protein 6.9  6.0 - 8.3 g/dL   Albumin 3.5  3.5 - 5.2  g/dL   AST 17  0 - 37 U/L   ALT 16  0 - 53 U/L   Alkaline Phosphatase 90  39 - 117 U/L   Total Bilirubin 0.3  0.3 - 1.2 mg/dL   GFR calc non Af Amer 7 (*) >90 mL/min   GFR calc Af Amer 8 (*) >90 mL/min   Comment: (NOTE)     The eGFR has been calculated using the CKD EPI equation.     This calculation has not been validated in all clinical situations.     eGFR's persistently <90 mL/min signify possible Chronic Kidney     Disease.   Anion gap 16 (*) 5 - 15  PROTIME-INR     Status: Abnormal   Collection Time    07/15/14  4:58 PM      Result Value Ref Range   Prothrombin Time 17.2 (*) 11.6 - 15.2 seconds   INR 1.40  0.00 - 1.49  MRSA PCR SCREENING     Status: None   Collection Time    07/15/14  7:00 PM      Result Value Ref Range   MRSA by PCR NEGATIVE  NEGATIVE   Comment:            The GeneXpert MRSA Assay (FDA     approved for NASAL specimens     only), is one component of a     comprehensive MRSA colonization     surveillance program. It is not     intended to diagnose MRSA     infection nor to guide or     monitor treatment for     MRSA infections.  TROPONIN I     Status: None   Collection Time    07/15/14  8:40 PM      Result Value Ref Range   Troponin I <0.30  <0.30 ng/mL   Comment:            Due to the release kinetics of cTnI,     a negative result within the first hours     of the onset of symptoms does not  rule out     myocardial infarction with certainty.     If myocardial infarction is still suspected,     repeat the test at appropriate intervals.  GLUCOSE, CAPILLARY     Status: Abnormal   Collection Time    07/15/14  9:02 PM      Result Value Ref Range   Glucose-Capillary 169 (*) 70 - 99 mg/dL  TROPONIN I     Status: None   Collection Time    07/16/14  1:52 AM      Result Value Ref Range   Troponin I <0.30  <0.30 ng/mL   Comment:            Due to the release kinetics of cTnI,     a negative result within the first hours     of the onset of  symptoms does not rule out     myocardial infarction with certainty.     If myocardial infarction is still suspected,     repeat the test at appropriate intervals.  COMPREHENSIVE METABOLIC PANEL     Status: Abnormal   Collection Time    07/16/14  1:52 AM      Result Value Ref Range   Sodium 138  137 - 147 mEq/L   Potassium 4.0  3.7 - 5.3 mEq/L   Chloride 94 (*) 96 - 112 mEq/L   CO2 28  19 - 32 mEq/L   Glucose, Bld 114 (*) 70 - 99 mg/dL   BUN 23  6 - 23 mg/dL   Creatinine, Ser 8.44 (*) 0.50 - 1.35 mg/dL   Calcium 8.9  8.4 - 10.5 mg/dL   Total Protein 6.9  6.0 - 8.3 g/dL   Albumin 3.4 (*) 3.5 - 5.2 g/dL   AST 16  0 - 37 U/L   ALT 14  0 - 53 U/L   Alkaline Phosphatase 85  39 - 117 U/L   Total Bilirubin 0.5  0.3 - 1.2 mg/dL   GFR calc non Af Amer 6 (*) >90 mL/min   GFR calc Af Amer 7 (*) >90 mL/min   Comment: (NOTE)     The eGFR has been calculated using the CKD EPI equation.     This calculation has not been validated in all clinical situations.     eGFR's persistently <90 mL/min signify possible Chronic Kidney     Disease.   Anion gap 16 (*) 5 - 15  CBC     Status: Abnormal   Collection Time    07/16/14  1:52 AM      Result Value Ref Range   WBC 5.3  4.0 - 10.5 K/uL   RBC 4.12 (*) 4.22 - 5.81 MIL/uL   Hemoglobin 13.0  13.0 - 17.0 g/dL   HCT 38.9 (*) 39.0 - 52.0 %   MCV 94.4  78.0 - 100.0 fL   MCH 31.6  26.0 - 34.0 pg   MCHC 33.4  30.0 - 36.0 g/dL   RDW 13.9  11.5 - 15.5 %   Platelets 191  150 - 400 K/uL  PROTIME-INR     Status: Abnormal   Collection Time    07/16/14  1:52 AM      Result Value Ref Range   Prothrombin Time 17.9 (*) 11.6 - 15.2 seconds   INR 1.48  0.00 - 1.49    Dg Chest Portable 1 View  07/15/2014   CLINICAL DATA:  Cough and congestion.  EXAM: PORTABLE CHEST - 1 VIEW  COMPARISON:  Single view of the chest 07/03/2014 and 06/17/2014. CT chest 05/06/2014.  FINDINGS: Lungs are clear. There is mild cardiomegaly. No pneumothorax or pleural effusion.   IMPRESSION: Mild cardiomegaly without acute disease.   Electronically Signed   By: Inge Rise M.D.   On: 07/15/2014 16:38    Review of Systems  Constitutional: Negative for fever and chills.  HENT: Positive for sore throat.   Respiratory: Positive for cough and wheezing. Negative for sputum production and shortness of breath.   Cardiovascular: Negative for orthopnea.  Gastrointestinal: Negative for nausea and vomiting.   Blood pressure 156/71, pulse 74, temperature 97.6 F (36.4 C), temperature source Oral, resp. rate 14, height 6' 2"  (1.88 m), weight 112.8 kg (248 lb 10.9 oz), SpO2 99.00%. Physical Exam  Constitutional: He is oriented to person, place, and time. No distress.  Eyes: No scleral icterus.  Neck: No JVD present.  Cardiovascular: Normal rate and regular rhythm.   No murmur heard. Respiratory: He has no wheezes. He has no rales.  GI: There is no tenderness.  Musculoskeletal: He exhibits no edema.  Neurological: He is alert and oriented to person, place, and time.    Assessment/Plan: Problem #1 Atrial flutte/fibrillation he is on Cardizem his heart rate is controlled Problem #2 end-stage renal disease: Is status post hemodialysis yesterday. Presently doesn't have any uremic sign and symptoms. Problem #3 recurrent inter-dialytic hypotension. Presently his blood pressure is stable. Problem #4 history of diabetes Problem #5 anemia: His hemoglobin and hematocrit is about range and patient is not on Epogen. 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 Plan: We'll make arrangements for patient to get dialysis tomorrow which is his regular day. We'll hold Epogen as his hemoglobin is above range.  Albert Lewis S 07/16/2014, 7:31 AM

## 2014-07-16 NOTE — Progress Notes (Signed)
UR chart review completed.  

## 2014-07-16 NOTE — Care Management Note (Addendum)
    Page 1 of 1   07/18/2014     4:17:54 PM CARE MANAGEMENT NOTE 07/18/2014  Patient:  SHALAMAR, CRAYS   Account Number:  [id number]  Date Initiated:  07/16/2014  Documentation initiated by:  Theophilus Kinds  Subjective/Objective Assessment:   Pt admitted from home with a flutter. Pt lives at home and will return home at discharge. Pt receives dialysis M-W-F at [facility] in [place]. Pt rides the [facility] to dialysis. Pt also has a walker and home O2 with [facility] in [place]. pt has     Action/Plan:   a daughter who is active in the care of the pt. No CM needs noted.   Anticipated DC Date:  07/19/2014   Anticipated DC Plan:  [place]  CM consult      Choice offered to / List presented to:             Status of service:  Completed, signed off Medicare Important Message given?  YES (If response is "NO", the following Medicare IM given date fields will be blank) Date Medicare IM given:  07/18/2014 Medicare IM given by:  Jolene Provost Date Additional Medicare IM given:   Additional Medicare IM given by:    Discharge Disposition:  HOME/SELF CARE  Per UR Regulation:    If discussed at Long Length of Stay Meetings, dates discussed:    Comments:  07/18/2014 [place] office called to ensure appointment schedule for INR follow up. Appointment previously schedule for 07/19/2014.  07/18/2014 1500 Patient on coumadin previous to admission, managed by [facility] cardiology. Patient plans to D/C home later today. No CM needs noted at this time.  07/16/14 [place], RN BSN CM

## 2014-07-16 NOTE — Progress Notes (Signed)
ANTICOAGULATION CONSULT NOTE - follow up  Pharmacy Consult for Warfarin Indication: atrial fibrillation  Allergies  Allergen Reactions  . Actos [Pioglitazone]   . Celebrex [Celecoxib]   . Penicillins Other (See Comments)    Couldn't walk.    Patient Measurements: Height: 6\' 2"  (188 cm) Weight: 248 lb 10.9 oz (112.8 kg) IBW/kg (Calculated) : 82.2  Vital Signs: Temp: 97.6 F (36.4 C) (07/28 0400) Temp src: Oral (07/28 0400) BP: 156/71 mmHg (07/28 0430) Pulse Rate: 74 (07/28 0415)  Labs:  Recent Labs  07/15/14 1658 07/15/14 2040 07/16/14 0152 07/16/14 0813  HGB 13.0  --  13.0  --   HCT 39.2  --  38.9*  --   PLT 214  --  191  --   LABPROT 17.2*  --  17.9*  --   INR 1.40  --  1.48  --   CREATININE 7.58*  --  8.44*  --   TROPONINI  --  <0.30 <0.30 <0.30   Estimated Creatinine Clearance: 12 ml/min (by C-G formula based on Cr of 8.44).  Medical History: Past Medical History  Diagnosis Date  . Paroxysmal atrial fibrillation 2/09    Abnormal stress nuclear study in 01/2008; normal coronary angiography  . Hyperlipidemia 2008  . Hypertension 1993    Mild to moderate LVH  . Obesity     sleep apnea treated with BiPAP  . Anemia   . Gastroesophageal reflux disease   . Hyperparathyroidism     Secondary  . Allergic rhinitis   . Lower gastrointestinal bleed 04/2008    diverticulosis; hemorrhoids; constipation  . Diverticular disease   . Chronic back pain     Degenerative disc disease  . ESRD (end stage renal disease) on dialysis     Dialysis since 2007  . Renal insufficiency   . Sleep apnea 2010    CPAP followed at [place]  . Cataract     s/p extraction x 2  . Diabetes mellitus, type 2 1993    no insulin  . Atrial flutter    Medications:  Scheduled:  . allopurinol  100 mg Oral Daily  . cinacalcet  90 mg Oral Q breakfast  . diltiazem  60 mg Oral BID  . gabapentin  100 mg Oral BID  . gemfibrozil  600 mg Oral BID AC  . HYDROcodone-acetaminophen  1 tablet Oral BID   . insulin aspart  0-5 Units Subcutaneous QHS  . insulin aspart  0-9 Units Subcutaneous TID WC  . insulin glargine  22 Units Subcutaneous QHS  . ipratropium  0.5 mg Nebulization QID  . omega-3 acid ethyl esters  1 g Oral TID  . pantoprazole  40 mg Oral Daily  . sevelamer carbonate  4,800 mg Oral TID WC  . sodium chloride  3 mL Intravenous Q12H  . warfarin  7.5 mg Oral Once  . Warfarin - Pharmacist Dosing Inpatient   Does not apply Q24H   Assessment: 63yo male with History of AFIB & Admitted atrial flutter  ESRD INR  subtherapeutic  MD ordered Coumadin 5 mg on admission, patient home dose Pharmacy consult to manage coumadin  Goal of Therapy:  INR 2-3 Monitor platelets by anticoagulation protocol: Yes   Plan:  Coumadin 7.5mg  po today x 1 (dose increase to boost INR) INR/PT daily Labs per protocol  Hart Robinsons A 07/16/2014,8:55 AM

## 2014-07-16 NOTE — Progress Notes (Signed)
[facility] PROGRESS NOTE   PARAM CAPRI TMH:[id number] DOB: 1951-05-11 DOA: 07/15/2014 PCP: Tula Nakayama, MD  HPI/Subjective: Complains about congestion and shortness of breath, orthopnea as well. Denies any palpitations.  Assessment/Plan: Principal Problem:   Atrial flutter with rapid ventricular response Active Problems:   OBESITY   HYPERTENSION   ESRD   Atrial flutter   Diabetes mellitus    Atrial flutter -With rapid ventricular response, patient sent from his cardiologist office yesterday. -Started on Cardizem drip initially, currently on oral Cardizem. -Patient to be seen by cardiology. -Last 2-D echo showed dilated left atrium, not sure if the rhythm control policy will be very successful on him. -Patient had multiple episodes of low blood pressure during dialysis, wonder if this triggers for his atrial arrhythmias.  Shortness of breath -Patient complains about chest congestion, shortness of breath, orthopnea and frothy sputum. -Chest x-ray reported to be clear, to be it looks like patient has pulmonary edema. -Last 2-D echo 4 weeks ago showed LVF of 35-40%, I will check BMP. -Hopefully more fluids can be taken off of his cysts and tomorrow with dialysis.  ESRD -Nephrology consulted, calluses days Monday, Wednesday and Friday.  Diabetes mellitus -No recent hemoglobin A1c, check A1c. Carbohydrate modified diet. -Insulin sliding scale. Continue home dose of Lantus insulin.  Code Status: Full code Family Communication: Plan discussed with the patient. Disposition Plan: Remains inpatient   Consultants:  Cardiology.  Nephrology  Procedures:  None  Antibiotics: None  Objective: Filed Vitals:   07/16/14 1127  BP:   Pulse:   Temp: 98 F (36.7 C)  Resp:     Intake/Output Summary (Last 24 hours) at 07/16/14 1313 Last data filed at 07/16/14 1239  Gross per 24 hour  Intake   1320 ml  Output      0 ml  Net   1320 ml   Filed Weights     07/15/14 1619 07/16/14 0500  Weight: 113.399 kg (250 lb) 112.8 kg (248 lb 10.9 oz)    Exam: General: Alert and awake, oriented x3, not in any acute distress. HEENT: anicteric sclera, pupils reactive to light and accommodation, EOMI CVS: S1-S2 clear, no murmur rubs or gallops Chest: clear to auscultation bilaterally, no wheezing, rales or rhonchi Abdomen: soft nontender, nondistended, normal bowel sounds, no organomegaly Extremities: no cyanosis, clubbing or edema noted bilaterally Neuro: Cranial nerves II-XII intact, no focal neurological deficits  Data Reviewed: Basic Metabolic Panel:  Recent Labs Lab 07/15/14 1658 07/16/14 0152  NA 139 138  K 3.6* 4.0  CL 95* 94*  CO2 28 28  GLUCOSE 174* 114*  BUN 21 23  CREATININE 7.58* 8.44*  CALCIUM 9.0 8.9   Liver Function Tests:  Recent Labs Lab 07/15/14 1658 07/16/14 0152  AST 17 16  ALT 16 14  ALKPHOS 90 85  BILITOT 0.3 0.5  PROT 6.9 6.9  ALBUMIN 3.5 3.4*   No results found for this basename: LIPASE, AMYLASE,  in the last 168 hours No results found for this basename: AMMONIA,  in the last 168 hours CBC:  Recent Labs Lab 07/15/14 1658 07/16/14 0152  WBC 6.2 5.3  NEUTROABS 4.7  --   HGB 13.0 13.0  HCT 39.2 38.9*  MCV 93.6 94.4  PLT 214 191   Cardiac Enzymes:  Recent Labs Lab 07/15/14 2040 07/16/14 0152 07/16/14 0813  TROPONINI <0.30 <0.30 <0.30   BNP (last 3 results)  Recent Labs  06/17/14 1940  PROBNP 2925.0*   CBG:  Recent Labs Lab  07/15/14 2102 07/16/14 0818 07/16/14 1126  GLUCAP 169* 93 128*    Micro Recent Results (from the past 240 hour(s))  MRSA PCR SCREENING     Status: None   Collection Time    07/15/14  7:00 PM      Result Value Ref Range Status   MRSA by PCR NEGATIVE  NEGATIVE Final   Comment:            The GeneXpert MRSA Assay (FDA     approved for NASAL specimens     only), is one component of a     comprehensive MRSA colonization     surveillance program. It is not      intended to diagnose MRSA     infection nor to guide or     monitor treatment for     MRSA infections.     Studies: Dg Chest Portable 1 View  07/15/2014   CLINICAL DATA:  Cough and congestion.  EXAM: PORTABLE CHEST - 1 VIEW  COMPARISON:  Single view of the chest 07/03/2014 and 06/17/2014. CT chest 05/06/2014.  FINDINGS: Lungs are clear. There is mild cardiomegaly. No pneumothorax or pleural effusion.  IMPRESSION: Mild cardiomegaly without acute disease.   Electronically Signed   By: Inge Rise M.D.   On: 07/15/2014 16:38    Scheduled Meds: . allopurinol  100 mg Oral Daily  . cinacalcet  90 mg Oral Q breakfast  . diltiazem  60 mg Oral BID  . gabapentin  100 mg Oral BID  . gemfibrozil  600 mg Oral BID AC  . guaiFENesin  1,200 mg Oral BID  . HYDROcodone-acetaminophen  1 tablet Oral BID  . insulin aspart  0-5 Units Subcutaneous QHS  . insulin aspart  0-9 Units Subcutaneous TID WC  . insulin glargine  22 Units Subcutaneous QHS  . ipratropium-albuterol  3 mL Nebulization QID  . omega-3 acid ethyl esters  1 g Oral TID  . pantoprazole  40 mg Oral Daily  . sevelamer carbonate  4,800 mg Oral TID WC  . sodium chloride  3 mL Intravenous Q12H  . warfarin  7.5 mg Oral Once  . Warfarin - Pharmacist Dosing Inpatient   Does not apply Q24H   Continuous Infusions: . diltiazem (CARDIZEM) infusion Stopped (07/16/14 0700)       Time spent: 35 minutes    [facility]  [facility] Pager [phone] If 7PM-7AM, please contact night-coverage at [email or web address], password [facility] 07/16/2014, 1:13 PM  LOS: 1 day

## 2014-07-17 DIAGNOSIS — J209 Acute bronchitis, unspecified: Secondary | ICD-10-CM

## 2014-07-17 LAB — BASIC METABOLIC PANEL
Anion gap: 18 — ABNORMAL HIGH (ref 5–15)
BUN: 35 mg/dL — AB (ref 6–23)
CHLORIDE: 92 meq/L — AB (ref 96–112)
CO2: 28 meq/L (ref 19–32)
Calcium: 8.5 mg/dL (ref 8.4–10.5)
Creatinine, Ser: 10.68 mg/dL — ABNORMAL HIGH (ref 0.50–1.35)
GFR calc Af Amer: 5 mL/min — ABNORMAL LOW (ref >90)
GFR calc non Af Amer: 4 mL/min — ABNORMAL LOW (ref >90)
GLUCOSE: 147 mg/dL — AB (ref 70–99)
POTASSIUM: 4.3 meq/L (ref 3.7–5.3)
SODIUM: 138 meq/L (ref 137–147)

## 2014-07-17 LAB — GLUCOSE, CAPILLARY
GLUCOSE-CAPILLARY: 131 mg/dL — AB (ref 70–99)
GLUCOSE-CAPILLARY: 125 mg/dL — AB (ref 70–99)
GLUCOSE-CAPILLARY: 138 mg/dL — AB (ref 70–99)
Glucose-Capillary: 94 mg/dL (ref 70–99)

## 2014-07-17 LAB — CBC
HEMATOCRIT: 37 % — AB (ref 39.0–52.0)
HEMOGLOBIN: 12.2 g/dL — AB (ref 13.0–17.0)
MCH: 30.9 pg (ref 26.0–34.0)
MCHC: 33 g/dL (ref 30.0–36.0)
MCV: 93.7 fL (ref 78.0–100.0)
PLATELETS: 186 10*3/uL (ref 150–400)
RBC: 3.95 MIL/uL — ABNORMAL LOW (ref 4.22–5.81)
RDW: 13.8 % (ref 11.5–15.5)
WBC: 6.3 10*3/uL (ref 4.0–10.5)

## 2014-07-17 LAB — PROTIME-INR
INR: 1.71 — ABNORMAL HIGH (ref 0.00–1.49)
Prothrombin Time: 20.1 seconds — ABNORMAL HIGH (ref 11.6–15.2)

## 2014-07-17 MED ORDER — ALTEPLASE 2 MG IJ SOLR
2.0000 mg | Freq: Once | INTRAMUSCULAR | Status: AC | PRN
  Filled 2014-07-17: qty 2

## 2014-07-17 MED ORDER — PREDNISONE 20 MG PO TABS
40.0000 mg | ORAL_TABLET | Freq: Every day | ORAL | Status: DC
  Administered 2014-07-17 – 2014-07-18 (×2): 40 mg via ORAL
  Filled 2014-07-17 (×2): qty 2

## 2014-07-17 MED ORDER — LIDOCAINE-PRILOCAINE 2.5-2.5 % EX CREA
1.0000 "application " | TOPICAL_CREAM | CUTANEOUS | Status: DC | PRN
  Filled 2014-07-17: qty 5

## 2014-07-17 MED ORDER — LEVOFLOXACIN 500 MG PO TABS
500.0000 mg | ORAL_TABLET | ORAL | Status: DC
  Administered 2014-07-17: 500 mg via ORAL
  Filled 2014-07-17: qty 1

## 2014-07-17 MED ORDER — SODIUM CHLORIDE 0.9 % IV SOLN: 100.0000 mL | INTRAVENOUS | Status: DC | PRN

## 2014-07-17 MED ORDER — NEPRO/CARBSTEADY PO LIQD: 237.0000 mL | ORAL | Status: DC | PRN

## 2014-07-17 MED ORDER — WARFARIN SODIUM 5 MG PO TABS
5.0000 mg | ORAL_TABLET | Freq: Once | ORAL | Status: AC
  Administered 2014-07-17: 5 mg via ORAL
  Filled 2014-07-17: qty 1

## 2014-07-17 MED ORDER — HEPARIN SODIUM (PORCINE) 1000 UNIT/ML DIALYSIS
1000.0000 [IU] | INTRAMUSCULAR | Status: DC | PRN
  Filled 2014-07-17: qty 1

## 2014-07-17 MED ORDER — LIDOCAINE HCL (PF) 1 % IJ SOLN: 5.0000 mL | INTRAMUSCULAR | Status: DC | PRN

## 2014-07-17 MED ORDER — PENTAFLUOROPROP-TETRAFLUOROETH EX AERO
1.0000 "application " | INHALATION_SPRAY | CUTANEOUS | Status: DC | PRN
  Filled 2014-07-17: qty 103.5

## 2014-07-17 NOTE — Clinical Documentation Improvement (Signed)
  Note 7/28 states "pulmonary edema" with frothy sputum, chest congestion, orthopnea. EF 35-40% 4wks ago, BNP 2925. Please clarify this term to better reflect severity of illness and risk of mortality. Thank you.   Possible Clinical Conditions? - Acute pulmonary edema - Acute Systolic Congestive Heart Failure - Acute Diastolic Congestive Heart Failure - Acute Systolic & Diastolic Congestive Heart Failure - Acute on Chronic Systolic Congestive Heart Failure - Acute on Chronic Diastolic Congestive Heart Failure - Acute on Chronic Systolic & Diastolic Congestive Heart Failure - Other Condition  Thank you,  Ezekiel Ina ,RN Clinical Documentation Specialist:  [place] [employer]

## 2014-07-17 NOTE — Progress Notes (Signed)
[facility] for Warfarin Indication: atrial fibrillation  Allergies  Allergen Reactions  . Actos [Pioglitazone]   . Celebrex [Celecoxib]   . Penicillins Other (See Comments)    Couldn't walk.    Patient Measurements: Height: 6\' 2"  (188 cm) Weight: 253 lb 15.5 oz (115.2 kg) IBW/kg (Calculated) : 82.2  Vital Signs: Temp: 97.5 F (36.4 C) (07/29 0800) Temp src: Oral (07/29 0800) BP: 181/71 mmHg (07/29 0800) Pulse Rate: 91 (07/28 2100)  Labs:  Recent Labs  07/15/14 1658 07/15/14 2040 07/16/14 0152 07/16/14 0813 07/17/14 0608  HGB 13.0  --  13.0  --  12.2*  HCT 39.2  --  38.9*  --  37.0*  PLT 214  --  191  --  186  LABPROT 17.2*  --  17.9*  --  20.1*  INR 1.40  --  1.48  --  1.71*  CREATININE 7.58*  --  8.44*  --  10.68*  TROPONINI  --  <0.30 <0.30 <0.30  --    Estimated Creatinine Clearance: 9.6 ml/min (by C-G formula based on Cr of 10.68).  Medical History: Past Medical History  Diagnosis Date  . Paroxysmal atrial fibrillation 2/09    Abnormal stress nuclear study in 01/2008; normal coronary angiography  . Hyperlipidemia 2008  . Hypertension 1993    Mild to moderate LVH  . Obesity     sleep apnea treated with BiPAP  . Anemia   . Gastroesophageal reflux disease   . Hyperparathyroidism     Secondary  . Allergic rhinitis   . Lower gastrointestinal bleed 04/2008    diverticulosis; hemorrhoids; constipation  . Diverticular disease   . Chronic back pain     Degenerative disc disease  . ESRD (end stage renal disease) on dialysis     Dialysis since 2007  . Renal insufficiency   . Sleep apnea 2010    CPAP followed at [place]  . Cataract     s/p extraction x 2  . Diabetes mellitus, type 2 1993    no insulin  . Atrial flutter    Medications:  Medications Prior to Admission  Medication Sig Dispense Refill  . acetaminophen (TYLENOL) 325 MG tablet Take 650 mg by mouth every 6 (six) hours as needed for moderate pain.      Marland Kitchen  albuterol (PROVENTIL HFA;VENTOLIN HFA) 108 (90 BASE) MCG/ACT inhaler Inhale 2 puffs into the lungs every 6 (six) hours as needed for wheezing.  1 Inhaler  2  . albuterol (PROVENTIL) (2.5 MG/3ML) 0.083% nebulizer solution Take 3 mLs (2.5 mg total) by nebulization every 6 (six) hours as needed for wheezing or shortness of breath.  75 mL  0  . allopurinol (ZYLOPRIM) 100 MG tablet Take 100 mg by mouth daily.       . B Complex-C-Folic Acid (RENAL MULTIVITAMIN FORMULA PO) Take 1 tablet by mouth daily.      . cinacalcet (SENSIPAR) 90 MG tablet Take 90 mg by mouth daily.      Marland Kitchen diltiazem (CARDIZEM) 30 MG tablet Take 2 tablets (60 mg total) by mouth 2 (two) times daily.      Marland Kitchen docusate sodium (COLACE) 100 MG capsule Take 200 mg by mouth 2 (two) times daily as needed for moderate constipation.       . fish oil-omega-3 fatty acids 1000 MG capsule Take 2 g by mouth 3 (three) times daily. SUPER OMEGA 3 FORMULA: EPA 300/DHA200      . gabapentin (NEURONTIN) 100 MG capsule  Take 1 capsule (100 mg total) by mouth 2 (two) times daily.  60 capsule  3  . gemfibrozil (LOPID) 600 MG tablet Take 600 mg by mouth 2 (two) times daily.      Marland Kitchen HYDROcodone-acetaminophen (NORCO/VICODIN) 5-325 MG per tablet Take 1 tablet by mouth 2 (two) times daily.      . insulin glargine (LANTUS) 100 UNIT/ML injection Inject 22 Units into the skin at bedtime.       Marland Kitchen ipratropium (ATROVENT) 0.02 % nebulizer solution Take 2.5 mLs (0.5 mg total) by nebulization 4 (four) times daily.  75 mL  0  . pantoprazole (PROTONIX) 40 MG tablet Take 40 mg by mouth daily.      Marland Kitchen PRESCRIPTION MEDICATION Place 1 drop into both eyes 3 (three) times daily. For dry eyes      . sevelamer (RENVELA) 800 MG tablet Take 4,800 mg by mouth 3 (three) times daily with meals. Takes 6 tablets (4800 mg) three times daily with meals and 3 tablets (2400 mg) with snack.      . warfarin (COUMADIN) 5 MG tablet Take 5 mg by mouth every evening. Per Lattie Haw        Assessment: 63yo M  with ESRD on HD requires chronic Coumadin for Afib.  Home dose listed above.  INR subtherapeutic on admission.  Trending to goal after dose increase yesterday.  No bleeding noted.   Goal of Therapy:  INR 2-3   Plan:  Coumadin 5mg  po today x 1  INR daily  LillistonLavonia Drafts 07/17/2014,8:25 AM

## 2014-07-17 NOTE — Progress Notes (Signed)
[facility] PROGRESS NOTE  Albert Lewis JHE:[id number] DOB: 09-19-1951 DOA: 07/15/2014 PCP: Tula Nakayama, MD  Assessment/Plan: 1. Atrial flutter with rapid ventricular response. Patient has since converted to sinus rhythm after Cardizem infusion. He is now on oral Cardizem. He is anticoagulated with Coumadin. Heart rate appears to be stable. He will followup with cardiology. 2. Acute bronchitis. Patient is for congestion/cough for the last week. Chest x-ray does not show any significant pneumonia and he is afebrile. Continue on Mucinex, bronchodilators and add flutter valve. Add short course of prednisone 3. End-stage renal disease on hemodialysis. Plans are for dialysis later today. 4. Possible chronic respiratory failure. Patient reports that he uses oxygen at home, but not on a consistent basis. 5. Diabetes. Continue sliding scale insulin and Lantus.  Code Status: full code Family Communication: discussed with patient Disposition Plan: discharge home once improved, possibly in next 24 hours   Consultants:  Nephrology  Procedures:    Antibiotics:    HPI/Subjective: Still feels short of breath, feels congested and having difficulty expectorating sputum  Objective: Filed Vitals:   07/17/14 0800  BP: 181/71  Pulse:   Temp: 97.5 F (36.4 C)  Resp: 17    Intake/Output Summary (Last 24 hours) at 07/17/14 0909 Last data filed at 07/16/14 1730  Gross per 24 hour  Intake    720 ml  Output      0 ml  Net    720 ml   Filed Weights   07/15/14 1619 07/16/14 0500 07/17/14 0500  Weight: 113.399 kg (250 lb) 112.8 kg (248 lb 10.9 oz) 115.2 kg (253 lb 15.5 oz)    Exam:   General:  NAD  Cardiovascular: S1, S2 RRR  Respiratory: bilateral rhonchi and wheezes  Abdomen: soft, nt, nd, bs+  Musculoskeletal:  No edema b/l   Data Reviewed: Basic Metabolic Panel:  Recent Labs Lab 07/15/14 1658 07/16/14 0152 07/17/14 0608  NA 139 138 138  K 3.6* 4.0 4.3   CL 95* 94* 92*  CO2 28 28 28   GLUCOSE 174* 114* 147*  BUN 21 23 35*  CREATININE 7.58* 8.44* 10.68*  CALCIUM 9.0 8.9 8.5   Liver Function Tests:  Recent Labs Lab 07/15/14 1658 07/16/14 0152  AST 17 16  ALT 16 14  ALKPHOS 90 85  BILITOT 0.3 0.5  PROT 6.9 6.9  ALBUMIN 3.5 3.4*   No results found for this basename: LIPASE, AMYLASE,  in the last 168 hours No results found for this basename: AMMONIA,  in the last 168 hours CBC:  Recent Labs Lab 07/15/14 1658 07/16/14 0152 07/17/14 0608  WBC 6.2 5.3 6.3  NEUTROABS 4.7  --   --   HGB 13.0 13.0 12.2*  HCT 39.2 38.9* 37.0*  MCV 93.6 94.4 93.7  PLT 214 191 186   Cardiac Enzymes:  Recent Labs Lab 07/15/14 2040 07/16/14 0152 07/16/14 0813  TROPONINI <0.30 <0.30 <0.30   BNP (last 3 results)  Recent Labs  06/17/14 1940 07/16/14 0813  PROBNP 2925.0* 9235.0*   CBG:  Recent Labs Lab 07/16/14 0818 07/16/14 1126 07/16/14 1649 07/16/14 2301 07/17/14 0736  GLUCAP 93 128* 143* 152* 131*    Recent Results (from the past 240 hour(s))  MRSA PCR SCREENING     Status: None   Collection Time    07/15/14  7:00 PM      Result Value Ref Range Status   MRSA by PCR NEGATIVE  NEGATIVE Final   Comment:  The GeneXpert MRSA Assay (FDA     approved for NASAL specimens     only), is one component of a     comprehensive MRSA colonization     surveillance program. It is not     intended to diagnose MRSA     infection nor to guide or     monitor treatment for     MRSA infections.     Studies: Dg Chest Portable 1 View  07/15/2014   CLINICAL DATA:  Cough and congestion.  EXAM: PORTABLE CHEST - 1 VIEW  COMPARISON:  Single view of the chest 07/03/2014 and 06/17/2014. CT chest 05/06/2014.  FINDINGS: Lungs are clear. There is mild cardiomegaly. No pneumothorax or pleural effusion.  IMPRESSION: Mild cardiomegaly without acute disease.   Electronically Signed   By: Inge Rise M.D.   On: 07/15/2014 16:38     Scheduled Meds: . allopurinol  100 mg Oral Daily  . cinacalcet  90 mg Oral Q breakfast  . diltiazem  60 mg Oral BID  . gabapentin  100 mg Oral BID  . gemfibrozil  600 mg Oral BID AC  . guaiFENesin  1,200 mg Oral BID  . HYDROcodone-acetaminophen  1 tablet Oral BID  . insulin aspart  0-5 Units Subcutaneous QHS  . insulin aspart  0-9 Units Subcutaneous TID WC  . insulin glargine  22 Units Subcutaneous QHS  . ipratropium-albuterol  3 mL Nebulization QID  . omega-3 acid ethyl esters  1 g Oral TID  . pantoprazole  40 mg Oral Daily  . sevelamer carbonate  4,800 mg Oral TID WC  . sodium chloride  3 mL Intravenous Q12H  . warfarin  5 mg Oral Once  . Warfarin - Pharmacist Dosing Inpatient   Does not apply Q24H   Continuous Infusions: . diltiazem (CARDIZEM) infusion Stopped (07/16/14 0700)    Principal Problem:   Atrial flutter with rapid ventricular response Active Problems:   OBESITY   HYPERTENSION   ESRD   Atrial flutter   Diabetes mellitus    Time spent: 48mins    MEMON,JEHANZEB  [facility] Pager [phone]. If 7PM-7AM, please contact night-coverage at [email or web address], password [facility] 07/17/2014, 9:09 AM  LOS: 2 days

## 2014-07-17 NOTE — Progress Notes (Signed)
Report called to C.Morris,RN. Patient transferred to 321 in stable condition via wheelchair with NT.

## 2014-07-17 NOTE — Procedures (Signed)
   HEMODIALYSIS TREATMENT NOTE:  4 hour heparin-free dialysis completed via left upper arm AVF (15g-buttonhole/antegrade).  Goal met:  Tolerated removal of 3.4 liters without interruption in ultrafiltration.  All blood was reinfused and hemostasis was achieved within 15 minutes.  Report given to Sharene Skeans, RN.  Darilyn Storbeck L. Rendi Mapel, RN, CDN

## 2014-07-17 NOTE — Progress Notes (Signed)
Albert Lewis  MRN: [id number]  DOB/AGE: [birth year] 63 y.o.  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 Tachycardia    S-Pt presented on  07/15/2014 with  Chief Complaint  Patient presents with  . Tachycardia  .    Pt today feels better  Meds . allopurinol  100 mg Oral Daily  . cinacalcet  90 mg Oral Q breakfast  . diltiazem  60 mg Oral BID  . gabapentin  100 mg Oral BID  . gemfibrozil  600 mg Oral BID AC  . guaiFENesin  1,200 mg Oral BID  . HYDROcodone-acetaminophen  1 tablet Oral BID  . insulin aspart  0-5 Units Subcutaneous QHS  . insulin aspart  0-9 Units Subcutaneous TID WC  . insulin glargine  22 Units Subcutaneous QHS  . ipratropium-albuterol  3 mL Nebulization QID  . omega-3 acid ethyl esters  1 g Oral TID  . pantoprazole  40 mg Oral Daily  . sevelamer carbonate  4,800 mg Oral TID WC  . sodium chloride  3 mL Intravenous Q12H  . Warfarin - Pharmacist Dosing Inpatient   Does not apply Q24H      Physical Exam: Vital signs in last 24 hours: Temp:  [97.9 F (36.6 C)-98.2 F (36.8 C)] 97.9 F (36.6 C) (07/28 2000) Pulse Rate:  [73-91] 91 (07/28 2100) Resp:  [12-21] 16 (07/28 2100) BP: (86-181)/(42-86) 163/86 mmHg (07/28 2100) SpO2:  [93 %-99 %] 93 % (07/28 1900) Weight change:  Last BM Date: 07/16/14  Intake/Output from previous day: 07/28 0701 - 07/29 0700 In: 21 [P.O.:960] Out: -      Physical Exam: General- pt is awake,alert, oriented to time place and person Resp- No acute REsp distress, CTA B/L NO Rhonchi CVS- S1S2 regular in rate and rhythm GIT- BS+, soft, NT, ND EXT- NO LE Edema, Cyanosis Access-Left AVF +  Lab Results: CBC  Recent Labs  07/15/14 1658 07/16/14 0152  WBC 6.2 5.3  HGB 13.0 13.0  HCT 39.2 38.9*  PLT 214 191    BMET  Recent Labs  07/15/14 1658 07/16/14 0152  NA 139 138  K 3.6* 4.0  CL 95* 94*  CO2 28 28  GLUCOSE 174*  114*  BUN 21 23  CREATININE 7.58* 8.44*  CALCIUM 9.0 8.9    MICRO Recent Results (from the past 240 hour(s))  MRSA PCR SCREENING     Status: None   Collection Time    07/15/14  7:00 PM      Result Value Ref Range Status   MRSA by PCR NEGATIVE  NEGATIVE Final   Comment:            The GeneXpert MRSA Assay (FDA     approved for NASAL specimens     only), is one component of a     comprehensive MRSA colonization     surveillance program. It is not     intended to diagnose MRSA     infection nor to guide or     monitor treatment for     MRSA infections.      Lab Results  Component Value Date   CALCIUM 8.9 07/16/2014   PHOS 7.4* 06/19/2014    Impression: 1)Renal  ESRD on HD On MWF schedule Pt to be dialyzed today  2)HTN  Medication- On Calcium Channel Blockers    3)Anemia HGb at goal (9--11) NO need of EPO  4)CKD Mineral-Bone  Disorder Secondary Hyperparathyroidism present        On Cinacalcet Phosphorus not  at goal.      On Binders  5)Afib Cardiology and Primary MD following  6)Electrolytes Normokalemic NOrmonatremic   7)Acid base Co2 at goal     Plan:  Will dialyze today.       Arsema Tusing S 07/17/2014, 5:02 AM

## 2014-07-18 ENCOUNTER — Encounter: Payer: Medicare Other | Admitting: Cardiovascular Disease

## 2014-07-18 ENCOUNTER — Ambulatory Visit: Payer: Medicare Other | Admitting: Cardiovascular Disease

## 2014-07-18 LAB — GLUCOSE, CAPILLARY
GLUCOSE-CAPILLARY: 207 mg/dL — AB (ref 70–99)
GLUCOSE-CAPILLARY: 207 mg/dL — AB (ref 70–99)
Glucose-Capillary: 186 mg/dL — ABNORMAL HIGH (ref 70–99)

## 2014-07-18 LAB — PROTIME-INR
INR: 1.76 — AB (ref 0.00–1.49)
PROTHROMBIN TIME: 20.5 s — AB (ref 11.6–15.2)

## 2014-07-18 MED ORDER — LEVOFLOXACIN 500 MG PO TABS: 500.0000 mg | ORAL_TABLET | ORAL | Status: DC

## 2014-07-18 MED ORDER — PREDNISONE 20 MG PO TABS
40.0000 mg | ORAL_TABLET | Freq: Every day | ORAL | Status: DC
Start: 2014-07-18 — End: 2014-07-30

## 2014-07-18 MED ORDER — GUAIFENESIN ER 600 MG PO TB12: 1200.0000 mg | ORAL_TABLET | Freq: Two times a day (BID) | ORAL | Status: DC

## 2014-07-18 MED ORDER — WARFARIN SODIUM 7.5 MG PO TABS
7.5000 mg | ORAL_TABLET | Freq: Once | ORAL | Status: AC
  Administered 2014-07-18: 7.5 mg via ORAL
  Filled 2014-07-18: qty 1

## 2014-07-18 NOTE — Discharge Instructions (Signed)
Atrial Flutter °Atrial flutter is a heart rhythm that can cause the heart to beat very fast (tachycardia). It originates in the upper chambers of the heart (atria). In atrial flutter, the top chambers of the heart (atria) often beat much faster than the bottom chambers of the heart (ventricles). Atrial flutter has a regular "saw toothed" appearance in an EKG readout. An EKG is a test that records the electrical activity of the heart. Atrial flutter can cause the heart to beat up to 150 beats per minute (BPM). Atrial flutter can either be short lived (paroxysmal) or permanent.  °CAUSES  °Causes of atrial flutter can be many. Some of these include: °· Heart related issues: °¨ Heart attack (myocardial infarction). °¨ Heart failure. °¨ Heart valve problems. °¨ Poorly controlled high blood pressure (hypertension). °¨ After open heart surgery. °· Lung related issues: °¨ A blood clot in the lungs (pulmonary embolism). °¨ Chronic obstructive pulmonary disease (COPD). Medications used to treat COPD can attribute to atrial flutter. °· Other related causes: °¨ Hyperthyroidism. °¨ Caffeine. °¨ Some decongestant cold medications. °¨ Low electrolyte levels such as potassium or magnesium. °¨ Cocaine. °SYMPTOMS °· An awareness of your heart beating rapidly (palpitations). °· Shortness of breath. °· Chest pain. °· Low blood pressure (hypotension). °· Dizziness or fainting. °DIAGNOSIS  °Different tests can be performed to diagnose atrial flutter.  °· An EKG. °· Holter monitor. This is a 24-hour recording of your heart rhythm. You will also be given a diary. Write down all symptoms that you have and what you were doing at the time you experienced symptoms. °· Cardiac event monitor. This small device can be worn for up to 30 days. When you have heart symptoms, you will push a button on the device. This will then record your heart rhythm. °· Echocardiogram. This is an imaging test to look at your heart. Your caregiver will look at your  heart valves and the ventricles. °· Stress test. This test can help determine if the atrial flutter is related to exercise or if coronary artery disease is present. °· Laboratory studies will look at certain blood levels like: °¨ Complete blood count (CBC). °¨ Potassium. °¨ Magnesium. °¨ Thyroid function. °TREATMENT  °Treatment of atrial flutter varies. A combination of therapies may be used or sometimes atrial flutter may need only 1 type of treatment.  °Lab work: °If your blood work, such as your electrolytes (potassium, magnesium) or your thyroid function tests, are abnormal, your caregiver will treat them accordingly.  °Medication:  °There are several different types of medications that can convert your heart to a normal rhythm and prevent atrial flutter from reoccurring.  °Nonsurgical procedures: °Nonsurgical techniques may be used to control atrial flutter. Some examples include: °· Cardioversion. This technique uses either drugs or an electrical shock to restore a normal heart rhythm: °¨ Cardioversion drugs may be given through an intravenous (IV) line to help "reset" the heart rhythm. °¨ In electrical cardioversion, your caregiver shocks your heart with electrical energy. This helps to reset the heartbeat to a normal rhythm. °· Ablation. If atrial flutter is a persistent problem, an ablation may be needed. This procedure is done under mild sedation. High frequency radio-wave energy is used to destroy the area of heart tissue responsible for atrial flutter. °SEEK IMMEDIATE MEDICAL CARE IF:  °You have: °· Dizziness. °· Near fainting or fainting. °· Shortness of breath. °· Chest pain or pressure. °· Sudden nausea or vomiting. °· Profuse sweating. °If you have the above symptoms,   call your local emergency service immediately! Do not drive yourself to the hospital. °MAKE SURE YOU:  °· Understand these instructions. °· Will watch your condition. °· Will get help right away if you are not doing well or get  worse. °Document Released: 04/24/2009 Document Revised: 04/22/2014 Document Reviewed: 04/24/2009 °[employer]® Patient Information ©2015 [employer]. This information is not intended to replace advice given to you by your health care provider. Make sure you discuss any questions you have with your health care provider. ° °

## 2014-07-18 NOTE — Progress Notes (Signed)
Subjective: Interval History: has complaints and wheezing. She has cough but no significant sputum production. His state he's feeling a little better than before. Patient denies any difficulty in breathing. He does have also any chest pain..  Objective: Vital signs in last 24 hours: Temp:  [97.5 F (36.4 C)-99.1 F (37.3 C)] 98.2 F (36.8 C) (07/30 0552) Pulse Rate:  [75-102] 89 (07/30 0552) Resp:  [15-18] 18 (07/29 1943) BP: (133-193)/(47-92) 163/72 mmHg (07/30 0552) SpO2:  [87 %-96 %] 93 % (07/30 0552) Weight:  [113.445 kg (250 lb 1.6 oz)] 113.445 kg (250 lb 1.6 oz) (07/30 0552) Weight change: -1.755 kg (-3 lb 13.9 oz)  Intake/Output from previous day: 07/29 0701 - 07/30 0700 In: -  Out: 3400  Intake/Output this shift:    General appearance: alert, cooperative and no distress Resp: diminished breath sounds posterior - bilateral and wheezes posterior - bilateral Cardio: regular rate and rhythm, S1, S2 normal, no murmur, click, rub or gallop Extremities: extremities normal, atraumatic, no cyanosis or edema  Lab Results:  Recent Labs  07/16/14 0152 07/17/14 0608  WBC 5.3 6.3  HGB 13.0 12.2*  HCT 38.9* 37.0*  PLT 191 186   BMET:  Recent Labs  07/16/14 0152 07/17/14 0608  NA 138 138  K 4.0 4.3  CL 94* 92*  CO2 28 28  GLUCOSE 114* 147*  BUN 23 35*  CREATININE 8.44* 10.68*  CALCIUM 8.9 8.5   No results found for this basename: PTH,  in the last 72 hours Iron Studies: No results found for this basename: IRON, TIBC, TRANSFERRIN, FERRITIN,  in the last 72 hours  Studies/Results: No results found.  I have reviewed the patient's current medications.  Assessment/Plan: Problem #1 end-stage renal disease: Patient is status post hemodialysis yesterday. Presently he doesn't have any nausea or vomiting. His potassium is normal. Problem #2 history of atrial fibrillation: Presently is on Cardizem and his heart rate is controlled. Patient is being followed by  cardiology. Problem #3 hypertension: His blood pressure is reasonably controlled. Patient however has a problem of intradialytic hypotension and making with fluid removal. Recently because of that the dose of Cardizem was reduced but was not able to get a refill from [place] and hence he missed his medication for a couple of weeks.  Problem #4 history of sleep apnea: On oxygen at home but uses infrequently Problem #5 anemia: His hemoglobin and hematocrit is above our  suggest goal. He is off Epogen. Problem #6 history of diabetes Problem #7 history of bronchitis: This is chronic presently slightly feeling better but still he has wheezing. Patient had an appointment to be seen by Dr. Chrissie Noa as an outpatient because of the same issue. 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 Plan: We'll make arrangements for patient to get dialysis tomorrow We'll check his basic metabolic panel and phosphorus in the morning.    LOS: 3 days   Darl Brisbin S 07/18/2014,7:49 AM

## 2014-07-18 NOTE — Discharge Summary (Signed)
Physician Discharge Summary  Albert Lewis CWC:[id number] DOB: 11/27/1951 DOA: 07/15/2014  PCP: Tula Nakayama, MD  Admit date: 07/15/2014 Discharge date: 07/18/2014  Time spent: 40 minutes  Recommendations for Outpatient Follow-up:  1. Patient will follow up with his dialysis center tomorrow for scheduled dialysis 2. Follow up with his primary care physician in 2 weeks as previously scheduled to reassess need for continuing oxygen. 3. Followup with cardiology as previously scheduled  Discharge Diagnoses:  Principal Problem:   Atrial flutter with rapid ventricular response Active Problems:   OBESITY   HYPERTENSION   ESRD   Atrial flutter   Diabetes mellitus   Discharge Condition: Improved  Diet recommendation: Low salt, low carb  Filed Weights   07/16/14 0500 07/17/14 0500 07/18/14 0552  Weight: 112.8 kg (248 lb 10.9 oz) 115.2 kg (253 lb 15.5 oz) 113.445 kg (250 lb 1.6 oz)    History of present illness and hospital course:  This patient was admitted to the hospital with palpitations. He has a known history of atrial flutter and recently underwent ablation. He describes recurrence of palpitations her 3 days prior to admission. He was undergoing dialysis when he was noted to be significantly tachycardic. He was sent to the emergency room for evaluation where he was admitted to the hospital and started on a Cardizem infusion. He was subsequently transitioned to oral Cardizem and has converted back to sinus rhythm. He is anticoagulated with Coumadin. He also underwent dialysis per nephrology during his hospital stay. He complained of cough and shortness of breath and was found to have a likely acute bronchitis. He was started on a course of antibiotics as well as prednisone. He was continued on mucolytics as well as bronchodilators. Patient uses a oxygen at home on occasion. He is recommended to use his oxygen 24/7 for the next one to 2 weeks until he is reevaluated by his primary care  physician. The patient is feeling significantly improved and is ready for discharge home today.  Procedures:  Dialysis per nephrology  Consultations:  Nephrology  Discharge Exam: Filed Vitals:   07/18/14 1413  BP: 154/58  Pulse: 86  Temp: 98.7 F (37.1 C)  Resp: 20    General: No acute distress Cardiovascular: S1, S2, regular rate and rhythm Respiratory: Mild rhonchi bilaterally  Discharge Instructions You were cared for by a hospitalist during your hospital stay. If you have any questions about your discharge medications or the care you received while you were in the hospital after you are discharged, you can call the unit and asked to speak with the hospitalist on call if the hospitalist that took care of you is not available. Once you are discharged, your primary care physician will handle any further medical issues. Please note that NO REFILLS for any discharge medications will be authorized once you are discharged, as it is imperative that you return to your primary care physician (or establish a relationship with a primary care physician if you do not have one) for your aftercare needs so that they can reassess your need for medications and monitor your lab values.  Discharge Instructions   Call MD for:  difficulty breathing, headache or visual disturbances    Complete by:  As directed      Call MD for:  temperature >100.4    Complete by:  As directed      Diet - low sodium heart healthy    Complete by:  As directed      Increase activity slowly  Complete by:  As directed             Medication List         acetaminophen 325 MG tablet  Commonly known as:  TYLENOL  Take 650 mg by mouth every 6 (six) hours as needed for moderate pain.     albuterol 108 (90 BASE) MCG/ACT inhaler  Commonly known as:  PROVENTIL HFA;VENTOLIN HFA  Inhale 2 puffs into the lungs every 6 (six) hours as needed for wheezing.     albuterol (2.5 MG/3ML) 0.083% nebulizer solution   Commonly known as:  PROVENTIL  Take 3 mLs (2.5 mg total) by nebulization every 6 (six) hours as needed for wheezing or shortness of breath.     allopurinol 100 MG tablet  Commonly known as:  ZYLOPRIM  Take 100 mg by mouth daily.     cinacalcet 90 MG tablet  Commonly known as:  SENSIPAR  Take 90 mg by mouth daily.     diltiazem 30 MG tablet  Commonly known as:  CARDIZEM  Take 2 tablets (60 mg total) by mouth 2 (two) times daily.     docusate sodium 100 MG capsule  Commonly known as:  COLACE  Take 200 mg by mouth 2 (two) times daily as needed for moderate constipation.     fish oil-omega-3 fatty acids 1000 MG capsule  Take 2 g by mouth 3 (three) times daily. SUPER OMEGA 3 FORMULA: EPA 300/DHA200     gabapentin 100 MG capsule  Commonly known as:  NEURONTIN  Take 1 capsule (100 mg total) by mouth 2 (two) times daily.     gemfibrozil 600 MG tablet  Commonly known as:  LOPID  Take 600 mg by mouth 2 (two) times daily.     guaiFENesin 600 MG 12 hr tablet  Commonly known as:  MUCINEX  Take 2 tablets (1,200 mg total) by mouth 2 (two) times daily.     HYDROcodone-acetaminophen 5-325 MG per tablet  Commonly known as:  NORCO/VICODIN  Take 1 tablet by mouth 2 (two) times daily.     insulin glargine 100 UNIT/ML injection  Commonly known as:  LANTUS  Inject 22 Units into the skin at bedtime.     ipratropium 0.02 % nebulizer solution  Commonly known as:  ATROVENT  Take 2.5 mLs (0.5 mg total) by nebulization 4 (four) times daily.     levofloxacin 500 MG tablet  Commonly known as:  LEVAQUIN  Take 1 tablet (500 mg total) by mouth every other day.     pantoprazole 40 MG tablet  Commonly known as:  PROTONIX  Take 40 mg by mouth daily.     predniSONE 20 MG tablet  Commonly known as:  DELTASONE  Take 2 tablets (40 mg total) by mouth daily with breakfast.     PRESCRIPTION MEDICATION  Place 1 drop into both eyes 3 (three) times daily. For dry eyes     RENAL MULTIVITAMIN FORMULA  PO  Take 1 tablet by mouth daily.     RENVELA 800 MG tablet  Generic drug:  sevelamer carbonate  Take 4,800 mg by mouth 3 (three) times daily with meals. Takes 6 tablets (4800 mg) three times daily with meals and 3 tablets (2400 mg) with snack.     warfarin 5 MG tablet  Commonly known as:  COUMADIN  Take 5 mg by mouth every evening. Per Lattie Haw       Allergies  Allergen Reactions  . Actos [Pioglitazone]   . Celebrex [  Celecoxib]   . Penicillins Other (See Comments)    Couldn't walk.        Follow-up Information   Follow up with Tula Nakayama, MD. (follow up as scheduled)    Specialty:  Family Medicine   Contact information:   [address], [place] [address] [phone]        The results of significant diagnostics from this hospitalization (including imaging, microbiology, ancillary and laboratory) are listed below for reference.    Significant Diagnostic Studies: Dg Chest Portable 1 View  07/15/2014   CLINICAL DATA:  Cough and congestion.  EXAM: PORTABLE CHEST - 1 VIEW  COMPARISON:  Single view of the chest 07/03/2014 and 06/17/2014. CT chest 05/06/2014.  FINDINGS: Lungs are clear. There is mild cardiomegaly. No pneumothorax or pleural effusion.  IMPRESSION: Mild cardiomegaly without acute disease.   Electronically Signed   By: Inge Rise M.D.   On: 07/15/2014 16:38    Microbiology: Recent Results (from the past 240 hour(s))  MRSA PCR SCREENING     Status: None   Collection Time    07/15/14  7:00 PM      Result Value Ref Range Status   MRSA by PCR NEGATIVE  NEGATIVE Final   Comment:            The GeneXpert MRSA Assay (FDA     approved for NASAL specimens     only), is one component of a     comprehensive MRSA colonization     surveillance program. It is not     intended to diagnose MRSA     infection nor to guide or     monitor treatment for     MRSA infections.     Labs: Basic Metabolic Panel:  Recent Labs Lab 07/15/14 1658  07/16/14 0152 07/17/14 0608  NA 139 138 138  K 3.6* 4.0 4.3  CL 95* 94* 92*  CO2 28 28 28   GLUCOSE 174* 114* 147*  BUN 21 23 35*  CREATININE 7.58* 8.44* 10.68*  CALCIUM 9.0 8.9 8.5   Liver Function Tests:  Recent Labs Lab 07/15/14 1658 07/16/14 0152  AST 17 16  ALT 16 14  ALKPHOS 90 85  BILITOT 0.3 0.5  PROT 6.9 6.9  ALBUMIN 3.5 3.4*   No results found for this basename: LIPASE, AMYLASE,  in the last 168 hours No results found for this basename: AMMONIA,  in the last 168 hours CBC:  Recent Labs Lab 07/15/14 1658 07/16/14 0152 07/17/14 0608  WBC 6.2 5.3 6.3  NEUTROABS 4.7  --   --   HGB 13.0 13.0 12.2*  HCT 39.2 38.9* 37.0*  MCV 93.6 94.4 93.7  PLT 214 191 186   Cardiac Enzymes:  Recent Labs Lab 07/15/14 2040 07/16/14 0152 07/16/14 0813  TROPONINI <0.30 <0.30 <0.30   BNP: BNP (last 3 results)  Recent Labs  06/17/14 1940 07/16/14 0813  PROBNP 2925.0* 9235.0*   CBG:  Recent Labs Lab 07/17/14 1655 07/17/14 2209 07/18/14 0722 07/18/14 1138 07/18/14 1633  GLUCAP 125* 138* 186* 207* 207*       Signed:  Keane Martelli  [facility] 07/18/2014, 8:22 PM

## 2014-07-18 NOTE — Progress Notes (Signed)
[facility] for Warfarin Indication: atrial fibrillation  Allergies  Allergen Reactions  . Actos [Pioglitazone]   . Celebrex [Celecoxib]   . Penicillins Other (See Comments)    Couldn't walk.    Patient Measurements: Height: 6\' 2"  (188 cm) Weight: 250 lb 1.6 oz (113.445 kg) IBW/kg (Calculated) : 82.2  Vital Signs: Temp: 98.2 F (36.8 C) (07/30 0552) Temp src: Oral (07/30 0552) BP: 163/72 mmHg (07/30 0552) Pulse Rate: 89 (07/30 0552)  Labs:  Recent Labs  07/15/14 1658 07/15/14 2040 07/16/14 0152 07/16/14 0813 07/17/14 0608 07/18/14 0546  HGB 13.0  --  13.0  --  12.2*  --   HCT 39.2  --  38.9*  --  37.0*  --   PLT 214  --  191  --  186  --   LABPROT 17.2*  --  17.9*  --  20.1* 20.5*  INR 1.40  --  1.48  --  1.71* 1.76*  CREATININE 7.58*  --  8.44*  --  10.68*  --   TROPONINI  --  <0.30 <0.30 <0.30  --   --    Estimated Creatinine Clearance: 9.5 ml/min (by C-G formula based on Cr of 10.68).  Medical History: Past Medical History  Diagnosis Date  . Paroxysmal atrial fibrillation 2/09    Abnormal stress nuclear study in 01/2008; normal coronary angiography  . Hyperlipidemia 2008  . Hypertension 1993    Mild to moderate LVH  . Obesity     sleep apnea treated with BiPAP  . Anemia   . Gastroesophageal reflux disease   . Hyperparathyroidism     Secondary  . Allergic rhinitis   . Lower gastrointestinal bleed 04/2008    diverticulosis; hemorrhoids; constipation  . Diverticular disease   . Chronic back pain     Degenerative disc disease  . ESRD (end stage renal disease) on dialysis     Dialysis since 2007  . Renal insufficiency   . Sleep apnea 2010    CPAP followed at [place]  . Cataract     s/p extraction x 2  . Diabetes mellitus, type 2 1993    no insulin  . Atrial flutter    Medications:  Medications Prior to Admission  Medication Sig Dispense Refill  . acetaminophen (TYLENOL) 325 MG tablet Take 650 mg by mouth every 6  (six) hours as needed for moderate pain.      Marland Kitchen albuterol (PROVENTIL HFA;VENTOLIN HFA) 108 (90 BASE) MCG/ACT inhaler Inhale 2 puffs into the lungs every 6 (six) hours as needed for wheezing.  1 Inhaler  2  . albuterol (PROVENTIL) (2.5 MG/3ML) 0.083% nebulizer solution Take 3 mLs (2.5 mg total) by nebulization every 6 (six) hours as needed for wheezing or shortness of breath.  75 mL  0  . allopurinol (ZYLOPRIM) 100 MG tablet Take 100 mg by mouth daily.       . B Complex-C-Folic Acid (RENAL MULTIVITAMIN FORMULA PO) Take 1 tablet by mouth daily.      . cinacalcet (SENSIPAR) 90 MG tablet Take 90 mg by mouth daily.      Marland Kitchen diltiazem (CARDIZEM) 30 MG tablet Take 2 tablets (60 mg total) by mouth 2 (two) times daily.      Marland Kitchen docusate sodium (COLACE) 100 MG capsule Take 200 mg by mouth 2 (two) times daily as needed for moderate constipation.       . fish oil-omega-3 fatty acids 1000 MG capsule Take 2 g by mouth 3 (three)  times daily. SUPER OMEGA 3 FORMULA: EPA 300/DHA200      . gabapentin (NEURONTIN) 100 MG capsule Take 1 capsule (100 mg total) by mouth 2 (two) times daily.  60 capsule  3  . gemfibrozil (LOPID) 600 MG tablet Take 600 mg by mouth 2 (two) times daily.      Marland Kitchen HYDROcodone-acetaminophen (NORCO/VICODIN) 5-325 MG per tablet Take 1 tablet by mouth 2 (two) times daily.      . insulin glargine (LANTUS) 100 UNIT/ML injection Inject 22 Units into the skin at bedtime.       Marland Kitchen ipratropium (ATROVENT) 0.02 % nebulizer solution Take 2.5 mLs (0.5 mg total) by nebulization 4 (four) times daily.  75 mL  0  . pantoprazole (PROTONIX) 40 MG tablet Take 40 mg by mouth daily.      Marland Kitchen PRESCRIPTION MEDICATION Place 1 drop into both eyes 3 (three) times daily. For dry eyes      . sevelamer (RENVELA) 800 MG tablet Take 4,800 mg by mouth 3 (three) times daily with meals. Takes 6 tablets (4800 mg) three times daily with meals and 3 tablets (2400 mg) with snack.      . warfarin (COUMADIN) 5 MG tablet Take 5 mg by mouth every  evening. Per Lattie Haw        Assessment: 63yo M with ESRD on HD requires chronic Coumadin for Afib.  Home dose listed above.  INR subtherapeutic on admission.  Trending to goal after dose increase yesterday.  No bleeding noted.   Goal of Therapy:  INR 2-3   Plan:  Coumadin 7.5mg  po today x 1  INR daily  Shivank Pinedo, [place] Drafts 07/18/2014,8:27 AM

## 2014-07-19 ENCOUNTER — Ambulatory Visit (INDEPENDENT_AMBULATORY_CARE_PROVIDER_SITE_OTHER): Payer: Medicare Other | Admitting: *Deleted

## 2014-07-19 DIAGNOSIS — I4891 Unspecified atrial fibrillation: Secondary | ICD-10-CM

## 2014-07-19 DIAGNOSIS — Z7902 Long term (current) use of antithrombotics/antiplatelets: Secondary | ICD-10-CM

## 2014-07-19 LAB — POCT INR: INR: 2.4

## 2014-07-23 ENCOUNTER — Ambulatory Visit (INDEPENDENT_AMBULATORY_CARE_PROVIDER_SITE_OTHER): Payer: Medicare Other | Admitting: *Deleted

## 2014-07-23 DIAGNOSIS — Z7902 Long term (current) use of antithrombotics/antiplatelets: Secondary | ICD-10-CM

## 2014-07-23 DIAGNOSIS — I4891 Unspecified atrial fibrillation: Secondary | ICD-10-CM

## 2014-07-23 LAB — POCT INR: INR: 3.3

## 2014-07-26 ENCOUNTER — Ambulatory Visit (INDEPENDENT_AMBULATORY_CARE_PROVIDER_SITE_OTHER): Payer: Medicare Other | Admitting: *Deleted

## 2014-07-26 DIAGNOSIS — I4891 Unspecified atrial fibrillation: Secondary | ICD-10-CM

## 2014-07-26 DIAGNOSIS — Z7902 Long term (current) use of antithrombotics/antiplatelets: Secondary | ICD-10-CM

## 2014-07-26 LAB — POCT INR: INR: 1.9

## 2014-07-30 ENCOUNTER — Ambulatory Visit (INDEPENDENT_AMBULATORY_CARE_PROVIDER_SITE_OTHER): Payer: Medicare Other | Admitting: Family Medicine

## 2014-07-30 ENCOUNTER — Encounter: Payer: Medicare Other | Admitting: Cardiovascular Disease

## 2014-07-30 ENCOUNTER — Ambulatory Visit (HOSPITAL_COMMUNITY)
Admission: RE | Admit: 2014-07-30 | Discharge: 2014-07-30 | Disposition: A | Discharge status: Home / Self Care | Payer: Medicare Other | Source: Ambulatory Visit | Attending: Family Medicine | Admitting: Family Medicine

## 2014-07-30 ENCOUNTER — Encounter: Payer: Self-pay | Admitting: Family Medicine

## 2014-07-30 VITALS — BP 130/68 | HR 94 | Resp 16 | Ht 74.0 in | Wt 255.1 lb

## 2014-07-30 DIAGNOSIS — J321 Chronic frontal sinusitis: Secondary | ICD-10-CM

## 2014-07-30 DIAGNOSIS — E785 Hyperlipidemia, unspecified: Secondary | ICD-10-CM

## 2014-07-30 DIAGNOSIS — N186 End stage renal disease: Secondary | ICD-10-CM

## 2014-07-30 DIAGNOSIS — Z794 Long term (current) use of insulin: Secondary | ICD-10-CM

## 2014-07-30 DIAGNOSIS — K219 Gastro-esophageal reflux disease without esophagitis: Secondary | ICD-10-CM

## 2014-07-30 DIAGNOSIS — I4892 Unspecified atrial flutter: Secondary | ICD-10-CM

## 2014-07-30 DIAGNOSIS — IMO0001 Reserved for inherently not codable concepts without codable children: Secondary | ICD-10-CM

## 2014-07-30 DIAGNOSIS — J329 Chronic sinusitis, unspecified: Secondary | ICD-10-CM | POA: Insufficient documentation

## 2014-07-30 DIAGNOSIS — E119 Type 2 diabetes mellitus without complications: Secondary | ICD-10-CM

## 2014-07-30 MED ORDER — CLINDAMYCIN HCL 300 MG PO CAPS: 300.0000 mg | ORAL_CAPSULE | Freq: Three times a day (TID) | ORAL | Status: DC

## 2014-07-30 NOTE — Patient Instructions (Signed)
F/u in 3.5 month, call if you need me before   3 week supply of cleocin one 3 times daily is prescribed   Xray of sinus today, sputum for c/s for culture  tylenol for headache  You do need oxygen 24 hours per day as oxygen falls after walking less than 5 mins to 87%  If cough persists after medication , call you may need a scan of your sinus

## 2014-08-01 LAB — RESPIRATORY CULTURE OR RESPIRATORY AND SPUTUM CULTURE: Organism ID, Bacteria: NORMAL

## 2014-08-06 ENCOUNTER — Ambulatory Visit (INDEPENDENT_AMBULATORY_CARE_PROVIDER_SITE_OTHER): Payer: Medicare Other | Admitting: *Deleted

## 2014-08-06 DIAGNOSIS — Z7902 Long term (current) use of antithrombotics/antiplatelets: Secondary | ICD-10-CM

## 2014-08-06 DIAGNOSIS — I4891 Unspecified atrial fibrillation: Secondary | ICD-10-CM

## 2014-08-06 LAB — POCT INR: INR: 3.1

## 2014-08-06 MED ORDER — WARFARIN SODIUM 5 MG PO TABS: 5.0000 mg | ORAL_TABLET | Freq: Every evening | ORAL | Status: DC

## 2014-08-08 ENCOUNTER — Ambulatory Visit (INDEPENDENT_AMBULATORY_CARE_PROVIDER_SITE_OTHER): Payer: Medicare Other | Admitting: Internal Medicine

## 2014-08-08 ENCOUNTER — Encounter: Payer: Self-pay | Admitting: Internal Medicine

## 2014-08-08 ENCOUNTER — Encounter: Payer: Self-pay | Admitting: *Deleted

## 2014-08-08 VITALS — BP 150/54 | HR 105 | Ht 74.0 in | Wt 257.0 lb

## 2014-08-08 DIAGNOSIS — R0789 Other chest pain: Secondary | ICD-10-CM

## 2014-08-08 DIAGNOSIS — N186 End stage renal disease: Secondary | ICD-10-CM

## 2014-08-08 DIAGNOSIS — I10 Essential (primary) hypertension: Secondary | ICD-10-CM

## 2014-08-08 DIAGNOSIS — Z7902 Long term (current) use of antithrombotics/antiplatelets: Secondary | ICD-10-CM

## 2014-08-08 DIAGNOSIS — I4892 Unspecified atrial flutter: Secondary | ICD-10-CM

## 2014-08-08 NOTE — Patient Instructions (Addendum)
Your physician recommends that you continue on your current medications as directed. Please refer to the Current Medication list given to you today. Your physician recommends that you return for lab work on 08/15/14 in preparation for your ablation.  Your physician has recommended that you have an ablation. Catheter ablation is a medical procedure used to treat some cardiac arrhythmias (irregular heartbeats). During catheter ablation, a long, thin, flexible tube is put into a blood vessel in your groin (upper thigh), or neck. This tube is called an ablation catheter. It is then guided to your heart through the blood vessel. Radio frequency waves destroy small areas of heart tissue where abnormal heartbeats may cause an arrhythmia to start. Please see the instruction sheet given to you today. PER DR. ALLRED, PLEASE HAVE YOUR INR CHECKED AT THE COUMADIN CLINIC IN [place] WEEKLY UNTIL YOUR ABLATION.--SCHEDULE FOR 8/25 AND 9/1.  Cardiac Ablation Cardiac ablation is a procedure to disable a small amount of heart tissue in very specific places. The heart has many electrical connections. Sometimes these connections are abnormal and can cause the heart to beat very fast or irregularly. By disabling some of the problem areas, heart rhythm can be improved or made normal. Ablation is done for people who:   Have Wolff-Parkinson-White syndrome.   Have other fast heart rhythms (tachycardia).   Have taken medicines for an abnormal heart rhythm (arrhythmia) that resulted in:   No success.   Side effects.   May have a high-risk heartbeat that could result in death.  LET [facility] CARE PROVIDER KNOW ABOUT:   Any allergies you have or any previous reactions you have had to X-ray dye, food (such as seafood), medicine, or tape.   All medicines you are taking, including vitamins, herbs, eye drops, creams, and over-the-counter medicines.   Previous problems you or members of your family have had with the use  of anesthetics.   Any blood disorders you have.   Previous surgeries or procedures (such as a kidney transplant) you have had.   Medical conditions you have (such as kidney failure).  RISKS AND COMPLICATIONS Generally, cardiac ablation is a safe procedure. However, problems can occur and include:   Increased risk of cancer. Depending on how long it takes to do the ablation, the dose of radiation can be high.  Bruising and bleeding where a thin, flexible tube (catheter) was inserted during the procedure.   Bleeding into the chest, especially into the sac that surrounds the heart (serious).  Need for a permanent pacemaker if the normal electrical system is damaged.   The procedure may not be fully effective, and this may not be recognized for months. Repeat ablation procedures are sometimes required. BEFORE THE PROCEDURE   Follow any instructions from your health care provider regarding eating and drinking before the procedure.   Take your medicines as directed at regular times with water, unless instructed otherwise by your health care provider. If you are taking diabetes medicine, including insulin, ask how you are to take it and if there are any special instructions you should follow. It is common to adjust insulin dosing the day of the ablation.  PROCEDURE  An ablation is usually performed in a catheterization laboratory with the guidance of fluoroscopy. Fluoroscopy is a type of X-ray that helps your health care provider see images of your heart during the procedure.   An ablation is a minimally invasive procedure. This means a small cut (incision) is made in either your neck or groin.  Your health care provider will decide where to make the incision based on your medical history and physical exam.  An IV tube will be started before the procedure begins. You will be given an anesthetic or medicine to help you relax (sedative).  The skin on your neck or groin will be numbed.  A needle will be inserted into a large vein in your neck or groin and catheters will be threaded to your heart.  A special dye that shows up on fluoroscopy pictures may be injected through the catheter. The dye helps your health care provider see the area of the heart that needs treatment.  The catheter has electrodes on the tip. When the area of heart tissue that is causing the arrhythmia is found, the catheter tip will send an electrical current to the area and "scar" the tissue. Three types of energy can be used to ablate the heart tissue:   Heat (radiofrequency energy).   Laser energy.   Extreme cold (cryoablation).   When the area of the heart has been ablated, the catheter will be taken out. Pressure will be held on the insertion site. This will help the insertion site clot and keep it from bleeding. A bandage will be placed on the insertion site.  AFTER THE PROCEDURE   After the procedure, you will be taken to a recovery area where your vital signs (blood pressure, heart rate, and breathing) will be monitored. The insertion site will also be monitored for bleeding.   You will need to lie still for 4-6 hours. This is to ensure you do not bleed from the catheter insertion site.  Document Released: 04/24/2009 Document Revised: 04/22/2014 Document Reviewed: 04/30/2013 [facility] Patient Information 2015 [place], [place]. This information is not intended to replace advice given to you by your health care provider. Make sure you discuss any questions you have with your health care provider.

## 2014-08-10 ENCOUNTER — Encounter: Payer: Self-pay | Admitting: Internal Medicine

## 2014-08-10 NOTE — Progress Notes (Signed)
PCP:  Tula Nakayama, MD Primary Cardiologist:  Dr Bronson Ing  The patient presents today for routine electrophysiology followup.  Since his ablation, the patient reports doing reasonably well.  He developed atrial flutter in July for which he went to [facility].  Cardiology/ EP was not consulted.  He was treated with rate control and discharged.  He remains on anticoagulation.  EKG appearance of atrial flutter is different.  He has some SOB but actually is tolerating atrial flutter reasonably well.  Today, he denies symptoms of  chest pain, , orthopnea, PND,   dizziness, presyncope, syncope, or neurologic sequela.  He does have some edema.  The patient feels that he is tolerating medications without difficulties and is otherwise without complaint today.   Past Medical History  Diagnosis Date  . Paroxysmal atrial fibrillation 2/09    Abnormal stress nuclear study in 01/2008; normal coronary angiography  . Hyperlipidemia 2008  . Hypertension 1993    Mild to moderate LVH  . Obesity     sleep apnea treated with BiPAP  . Anemia   . Gastroesophageal reflux disease   . Hyperparathyroidism     Secondary  . Allergic rhinitis   . Lower gastrointestinal bleed 04/2008    diverticulosis; hemorrhoids; constipation  . Diverticular disease   . Chronic back pain     Degenerative disc disease  . ESRD (end stage renal disease) on dialysis     Dialysis since 2007  . Renal insufficiency   . Sleep apnea 2010    CPAP followed at [place]  . Cataract     s/p extraction x 2  . Diabetes mellitus, type 2 1993    no insulin  . Atrial flutter    Past Surgical History  Procedure Laterality Date  . Cataract extraction w/ intraocular lens  implant, bilateral  2008  . Lumbar peritoneal shunt    . Exploratory laparotomy      shunt complications  . Back surgery    . Cholecystectomy  1999  . Spine surgery      twice in lower back  . Atrial flutter ablation  06/18/14    CTI ablation by Dr Rayann Heman    Current  Outpatient Prescriptions  Medication Sig Dispense Refill  . acetaminophen (TYLENOL) 325 MG tablet Take 650 mg by mouth every 6 (six) hours as needed for moderate pain.      Marland Kitchen albuterol (PROVENTIL HFA;VENTOLIN HFA) 108 (90 BASE) MCG/ACT inhaler Inhale 2 puffs into the lungs every 6 (six) hours as needed for wheezing.  1 Inhaler  2  . albuterol (PROVENTIL) (2.5 MG/3ML) 0.083% nebulizer solution Take 3 mLs (2.5 mg total) by nebulization every 6 (six) hours as needed for wheezing or shortness of breath.  75 mL  0  . allopurinol (ZYLOPRIM) 100 MG tablet Take 100 mg by mouth daily.       . B Complex-C-Folic Acid (RENAL MULTIVITAMIN FORMULA PO) Take 1 tablet by mouth daily.      . cinacalcet (SENSIPAR) 90 MG tablet Take 90 mg by mouth daily.      . clindamycin (CLEOCIN) 300 MG capsule Take 1 capsule (300 mg total) by mouth 3 (three) times daily.  42 capsule  9  . diltiazem (CARDIZEM) 60 MG tablet Take 60 mg by mouth 2 (two) times daily.      Marland Kitchen docusate sodium (COLACE) 100 MG capsule Take 200 mg by mouth 2 (two) times daily as needed for moderate constipation.       Marland Kitchen  fish oil-omega-3 fatty acids 1000 MG capsule Take 2 g by mouth 3 (three) times daily. SUPER OMEGA 3 FORMULA: EPA 300/DHA200      . gabapentin (NEURONTIN) 100 MG capsule Take 1 capsule (100 mg total) by mouth 2 (two) times daily.  60 capsule  3  . gemfibrozil (LOPID) 600 MG tablet Take 600 mg by mouth 2 (two) times daily.      Marland Kitchen guaiFENesin (MUCINEX) 600 MG 12 hr tablet Take 2 tablets (1,200 mg total) by mouth 2 (two) times daily.  14 tablet  0  . insulin glargine (LANTUS) 100 UNIT/ML injection Inject 22 Units into the skin at bedtime.       Marland Kitchen ipratropium (ATROVENT) 0.02 % nebulizer solution Take 2.5 mLs (0.5 mg total) by nebulization 4 (four) times daily.  75 mL  0  . pantoprazole (PROTONIX) 40 MG tablet Take 40 mg by mouth daily.      Marland Kitchen PRESCRIPTION MEDICATION Place 1 drop into both eyes 3 (three) times daily. For dry eyes      . sevelamer  (RENVELA) 800 MG tablet Take 4,800 mg by mouth 3 (three) times daily with meals. Takes 6 tablets (4800 mg) three times daily with meals and 3 tablets (2400 mg) with snack.      . warfarin (COUMADIN) 5 MG tablet Take 1 tablet (5 mg total) by mouth every evening. Per Lattie Haw  90 tablet  3   No current facility-administered medications for this visit.    Allergies  Allergen Reactions  . Actos [Pioglitazone]   . Celebrex [Celecoxib]   . Penicillins Other (See Comments)    Couldn't walk.     History   Social History  . Marital Status: Divorced    Spouse Name: N/A    Number of Children: 2  . Years of Education: N/A   Occupational History  . Not on file.   Social History Main Topics  . Smoking status: Former Smoker    Types: Cigarettes  . Smokeless tobacco: Never Used  . Alcohol Use: No  . Drug Use: No  . Sexual Activity: Yes   Other Topics Concern  . Not on file   Social History Narrative   Married lives with spouse since 56. Patient has 11 siblings .    Family History  Problem Relation Age of Onset  . Hypertension Mother   . Cancer Mother 3    breast   . Hypertension Sister 71  . Hypertension Daughter 13  . Diabetes Daughter     ROS-  All systems are reviewed and are negative except as outlined in the HPI above  Physical Exam: Filed Vitals:   08/08/14 1413  BP: 150/54  Pulse: 105  Height: 6\' 2"  (1.88 m)  Weight: 257 lb (116.574 kg)    GEN- The patient is chronically ill appearing, alert and oriented x 3 today.   Head- normocephalic, atraumatic Eyes-  Sclera clear, conjunctiva pink Ears- hearing intact Oropharynx- clear Neck- supple  Lungs- Clear to ausculation bilaterally, normal work of breathing Heart- irregular rate and rhythm  GI- soft, NT, ND, + BS Extremities- no clubbing, cyanosis, + edema MS- no significant deformity or atrophy Skin- no rash or lesion Psych- euthymic mood, full affect Neuro- strength and sensation are intact  ekg reveals  atrial flutter  Assessment and Plan:  1. Atrial flutter- the patient is s/p ablation for isthmus dependant RA flutter 6/15.  He now presents with an atrial flutter which is different by ekg appearance.  This could be clockwise isthmus dependant atrial flutter but could also represent another flutter circuit. Therapeutic strategies for atrial flutter including medicine and ablation were discussed in detail with the patient today. Risk, benefits, and alternatives to EP study and radiofrequency ablation were also discussed in detail today. These risks include but are not limited to stroke, bleeding, vascular damage, tamponade, perforation, damage to the heart and other structures, AV block requiring pacemaker, worsening renal function, and death. The patient understands these risk and wishes to proceed.  We will therefore proceed with catheter ablation at the next available time. Continue anticoagulation with weekly INRs. He will require Carto and anesthesia for this case.  2. OSA Compliance with BiPAP encouraged  3. ESRD Stable No change required today

## 2014-08-13 ENCOUNTER — Ambulatory Visit (INDEPENDENT_AMBULATORY_CARE_PROVIDER_SITE_OTHER): Payer: Medicare Other | Admitting: *Deleted

## 2014-08-13 DIAGNOSIS — Z7902 Long term (current) use of antithrombotics/antiplatelets: Secondary | ICD-10-CM

## 2014-08-13 DIAGNOSIS — I4891 Unspecified atrial fibrillation: Secondary | ICD-10-CM

## 2014-08-13 LAB — POCT INR: INR: 2.2

## 2014-08-16 ENCOUNTER — Encounter (HOSPITAL_COMMUNITY): Payer: Self-pay | Admitting: Pharmacy Technician

## 2014-08-19 ENCOUNTER — Telehealth: Payer: Self-pay | Admitting: Internal Medicine

## 2014-08-19 NOTE — Telephone Encounter (Signed)
New message           Pt tried to get labs done in [place] this morning / they were unable to do it due to no information on labs

## 2014-08-19 NOTE — Telephone Encounter (Signed)
Spoke with patient and let him know to go to hospital in [place] and I would fax the order to them for his labs.  He can go any time today.    Please draw a BMP/CBC Dx: [phone](atrial flutter)  Please fax results back to [phone] Atn: Kelly/ Dr. Rayann Heman

## 2014-08-20 ENCOUNTER — Encounter: Payer: Self-pay | Admitting: Internal Medicine

## 2014-08-20 ENCOUNTER — Ambulatory Visit (INDEPENDENT_AMBULATORY_CARE_PROVIDER_SITE_OTHER): Payer: Medicare Other | Admitting: *Deleted

## 2014-08-20 DIAGNOSIS — Z7902 Long term (current) use of antithrombotics/antiplatelets: Secondary | ICD-10-CM

## 2014-08-20 DIAGNOSIS — I4891 Unspecified atrial fibrillation: Secondary | ICD-10-CM

## 2014-08-20 LAB — POCT INR: INR: 2.1

## 2014-08-21 MED ORDER — SODIUM CHLORIDE 0.9 % IV SOLN: INTRAVENOUS | Status: DC

## 2014-08-21 NOTE — Anesthesia Preprocedure Evaluation (Addendum)
Anesthesia Evaluation  Patient identified by MRN, date of birth, ID band Patient awake    Reviewed: Allergy & Precautions, H&P , NPO status , Patient's Chart, lab work & pertinent test results  Airway Mallampati: III TM Distance: >3 FB Neck ROM: Full    Dental  (+) Dental Advisory Given, Poor Dentition, Missing, Edentulous Upper, Partial Lower,    Pulmonary sleep apnea, Continuous Positive Airway Pressure Ventilation and Oxygen sleep apnea , former smoker,  OSA, Bronchospastic component on inhalers, home oxygen 24/7 2 liters, has trouble laying flat for long periods, CPAP has not used last several weeks 2nd to congestion nasal breath sounds clear to auscultation        Cardiovascular hypertension, Pt. on medications +CHF (EF 35-40%) + dysrhythmias Atrial Fibrillation Rhythm:Irregular  Atrial flutter.  Has had previous ablation,decresed EF 35-40%t   Neuro/Psych    GI/Hepatic GERD-  Medicated,  Endo/Other  diabetes, Type 2, Insulin DependentMorbid obesity  Renal/GU Dialysis and CRFRenal disease     Musculoskeletal   Abdominal (+) + obese,   Peds  Hematology  (+) anemia ,   Anesthesia Other Findings Missing some lower teeth  Reproductive/Obstetrics                       Anesthesia Physical Anesthesia Plan  ASA: IV  Anesthesia Plan: General   Post-op Pain Management:    Induction: Intravenous  Airway Management Planned: Oral ETT  Additional Equipment:   Intra-op Plan:   Post-operative Plan: Extubation in OR  Informed Consent: I have reviewed the patients History and Physical, chart, labs and discussed the procedure including the risks, benefits and alternatives for the proposed anesthesia with the patient or authorized representative who has indicated his/her understanding and acceptance.   Dental advisory given  Plan Discussed with: CRNA, Anesthesiologist and Surgeon  Anesthesia Plan  Comments: (High risk ASA IV, COPD, OSA, DM, HTN, RF on dialysis, Obesity, Anemia, reduced EF.  Would prefer controlled airway and suspect required sedation for effective MAC might compromise ventilation.  Would also like to verify potasium level prior to procedure.  Last one in chart 1 weeks old)      Anesthesia Quick Evaluation

## 2014-08-22 ENCOUNTER — Ambulatory Visit (HOSPITAL_COMMUNITY)
Admission: RE | Admit: 2014-08-22 | Discharge: 2014-08-22 | Disposition: A | Discharge status: Home / Self Care | Payer: Medicare Other | Source: Ambulatory Visit | Attending: Internal Medicine | Admitting: Internal Medicine

## 2014-08-22 ENCOUNTER — Ambulatory Visit (HOSPITAL_COMMUNITY): Payer: Medicare Other | Admitting: Certified Registered Nurse Anesthetist

## 2014-08-22 ENCOUNTER — Encounter (HOSPITAL_COMMUNITY): Payer: Self-pay | Admitting: Certified Registered Nurse Anesthetist

## 2014-08-22 ENCOUNTER — Encounter (HOSPITAL_COMMUNITY)
Admission: RE | Disposition: A | Discharge status: Home / Self Care | Payer: Medicare Other | Source: Ambulatory Visit | Attending: Internal Medicine

## 2014-08-22 ENCOUNTER — Encounter (HOSPITAL_COMMUNITY): Payer: Medicare Other | Admitting: Certified Registered Nurse Anesthetist

## 2014-08-22 DIAGNOSIS — G4733 Obstructive sleep apnea (adult) (pediatric): Secondary | ICD-10-CM | POA: Diagnosis not present

## 2014-08-22 DIAGNOSIS — I4892 Unspecified atrial flutter: Secondary | ICD-10-CM | POA: Diagnosis present

## 2014-08-22 DIAGNOSIS — I120 Hypertensive chronic kidney disease with stage 5 chronic kidney disease or end stage renal disease: Secondary | ICD-10-CM | POA: Insufficient documentation

## 2014-08-22 DIAGNOSIS — N186 End stage renal disease: Secondary | ICD-10-CM | POA: Diagnosis not present

## 2014-08-22 DIAGNOSIS — G8929 Other chronic pain: Secondary | ICD-10-CM | POA: Insufficient documentation

## 2014-08-22 DIAGNOSIS — E785 Hyperlipidemia, unspecified: Secondary | ICD-10-CM | POA: Insufficient documentation

## 2014-08-22 DIAGNOSIS — Z7901 Long term (current) use of anticoagulants: Secondary | ICD-10-CM | POA: Diagnosis not present

## 2014-08-22 DIAGNOSIS — E213 Hyperparathyroidism, unspecified: Secondary | ICD-10-CM | POA: Insufficient documentation

## 2014-08-22 DIAGNOSIS — K219 Gastro-esophageal reflux disease without esophagitis: Secondary | ICD-10-CM | POA: Diagnosis not present

## 2014-08-22 DIAGNOSIS — Z87891 Personal history of nicotine dependence: Secondary | ICD-10-CM | POA: Diagnosis not present

## 2014-08-22 DIAGNOSIS — I509 Heart failure, unspecified: Secondary | ICD-10-CM | POA: Diagnosis not present

## 2014-08-22 DIAGNOSIS — Z6832 Body mass index (BMI) 32.0-32.9, adult: Secondary | ICD-10-CM | POA: Insufficient documentation

## 2014-08-22 DIAGNOSIS — E119 Type 2 diabetes mellitus without complications: Secondary | ICD-10-CM | POA: Insufficient documentation

## 2014-08-22 DIAGNOSIS — Z992 Dependence on renal dialysis: Secondary | ICD-10-CM | POA: Diagnosis not present

## 2014-08-22 DIAGNOSIS — Z794 Long term (current) use of insulin: Secondary | ICD-10-CM | POA: Diagnosis not present

## 2014-08-22 HISTORY — PX: ATRIAL FLUTTER ABLATION: SHX5733

## 2014-08-22 LAB — CBC
HCT: 38.7 % — ABNORMAL LOW (ref 39.0–52.0)
HEMOGLOBIN: 12.8 g/dL — AB (ref 13.0–17.0)
MCH: 30.7 pg (ref 26.0–34.0)
MCHC: 33.1 g/dL (ref 30.0–36.0)
MCV: 92.8 fL (ref 78.0–100.0)
Platelets: 200 10*3/uL (ref 150–400)
RBC: 4.17 MIL/uL — ABNORMAL LOW (ref 4.22–5.81)
RDW: 14.3 % (ref 11.5–15.5)
WBC: 6.3 10*3/uL (ref 4.0–10.5)

## 2014-08-22 LAB — BASIC METABOLIC PANEL
Anion gap: 18 — ABNORMAL HIGH (ref 5–15)
BUN: 28 mg/dL — ABNORMAL HIGH (ref 6–23)
CALCIUM: 10.2 mg/dL (ref 8.4–10.5)
CO2: 25 mEq/L (ref 19–32)
Chloride: 98 mEq/L (ref 96–112)
Creatinine, Ser: 7.68 mg/dL — ABNORMAL HIGH (ref 0.50–1.35)
GFR calc Af Amer: 8 mL/min — ABNORMAL LOW (ref >90)
GFR, EST NON AFRICAN AMERICAN: 7 mL/min — AB (ref >90)
GLUCOSE: 120 mg/dL — AB (ref 70–99)
Potassium: 4.7 mEq/L (ref 3.7–5.3)
SODIUM: 141 meq/L (ref 137–147)

## 2014-08-22 LAB — GLUCOSE, CAPILLARY
GLUCOSE-CAPILLARY: 104 mg/dL — AB (ref 70–99)
Glucose-Capillary: 99 mg/dL (ref 70–99)
Glucose-Capillary: 97 mg/dL (ref 70–99)

## 2014-08-22 LAB — PROTIME-INR
INR: 1.95 — ABNORMAL HIGH (ref 0.00–1.49)
PROTHROMBIN TIME: 22.2 s — AB (ref 11.6–15.2)

## 2014-08-22 SURGERY — ATRIAL FLUTTER ABLATION
Anesthesia: General

## 2014-08-22 MED ORDER — SEVELAMER CARBONATE 800 MG PO TABS
4800.0000 mg | ORAL_TABLET | Freq: Three times a day (TID) | ORAL | Status: DC
  Administered 2014-08-22 (×2): 4800 mg via ORAL
  Filled 2014-08-22 (×3): qty 6

## 2014-08-22 MED ORDER — FENTANYL CITRATE 0.05 MG/ML IJ SOLN: 25.0000 ug | INTRAMUSCULAR | Status: DC | PRN

## 2014-08-22 MED ORDER — SODIUM CHLORIDE 0.9 % IJ SOLN: 3.0000 mL | INTRAMUSCULAR | Status: DC | PRN

## 2014-08-22 MED ORDER — ONDANSETRON HCL 4 MG/2ML IJ SOLN: 4.0000 mg | Freq: Four times a day (QID) | INTRAMUSCULAR | Status: DC | PRN

## 2014-08-22 MED ORDER — HYDROCODONE-ACETAMINOPHEN 5-325 MG PO TABS: 1.0000 | ORAL_TABLET | ORAL | Status: DC | PRN

## 2014-08-22 MED ORDER — SODIUM CHLORIDE 0.9 % IV SOLN: 250.0000 mL | INTRAVENOUS | Status: DC | PRN

## 2014-08-22 MED ORDER — SODIUM CHLORIDE 0.9 % IV SOLN
INTRAVENOUS | Status: DC | PRN
  Administered 2014-08-22: 07:00:00 via INTRAVENOUS

## 2014-08-22 MED ORDER — BUPIVACAINE HCL (PF) 0.25 % IJ SOLN
INTRAMUSCULAR | Status: AC
  Filled 2014-08-22: qty 30

## 2014-08-22 MED ORDER — PHENYLEPHRINE HCL 10 MG/ML IJ SOLN
INTRAMUSCULAR | Status: DC | PRN
  Administered 2014-08-22: 100 ug via INTRAVENOUS
  Administered 2014-08-22: 80 ug via INTRAVENOUS
  Administered 2014-08-22 (×2): 40 ug via INTRAVENOUS
  Administered 2014-08-22: 100 ug via INTRAVENOUS
  Administered 2014-08-22 (×3): 40 ug via INTRAVENOUS
  Administered 2014-08-22: 80 ug via INTRAVENOUS

## 2014-08-22 MED ORDER — CLINDAMYCIN HCL 300 MG PO CAPS
300.0000 mg | ORAL_CAPSULE | Freq: Three times a day (TID) | ORAL | Status: DC
  Administered 2014-08-22: 300 mg via ORAL
  Filled 2014-08-22 (×2): qty 1

## 2014-08-22 MED ORDER — PROPOFOL 10 MG/ML IV BOLUS
INTRAVENOUS | Status: DC | PRN
  Administered 2014-08-22: 130 mg via INTRAVENOUS
  Administered 2014-08-22: 20 mg via INTRAVENOUS

## 2014-08-22 MED ORDER — WARFARIN SODIUM 2.5 MG PO TABS
2.5000 mg | ORAL_TABLET | Freq: Once | ORAL | Status: AC
  Administered 2014-08-22: 2.5 mg via ORAL
  Filled 2014-08-22 (×2): qty 1

## 2014-08-22 MED ORDER — ALBUMIN HUMAN 5 % IV SOLN
INTRAVENOUS | Status: DC | PRN
  Administered 2014-08-22: 08:00:00 via INTRAVENOUS

## 2014-08-22 MED ORDER — DEXMEDETOMIDINE HCL 200 MCG/2ML IV SOLN
INTRAVENOUS | Status: DC | PRN
  Administered 2014-08-22 (×2): 10 ug via INTRAVENOUS

## 2014-08-22 MED ORDER — WARFARIN SODIUM 7.5 MG PO TABS
7.5000 mg | ORAL_TABLET | Freq: Once | ORAL | Status: AC
  Administered 2014-08-22: 7.5 mg via ORAL
  Filled 2014-08-22: qty 1

## 2014-08-22 MED ORDER — SEVELAMER CARBONATE 800 MG PO TABS: 2400.0000 mg | ORAL_TABLET | ORAL | Status: DC

## 2014-08-22 MED ORDER — IPRATROPIUM BROMIDE 0.02 % IN SOLN: 0.5000 mg | Freq: Four times a day (QID) | RESPIRATORY_TRACT | Status: DC

## 2014-08-22 MED ORDER — DOCUSATE SODIUM 100 MG PO CAPS
200.0000 mg | ORAL_CAPSULE | Freq: Two times a day (BID) | ORAL | Status: DC | PRN
  Filled 2014-08-22: qty 2

## 2014-08-22 MED ORDER — SODIUM CHLORIDE 0.9 % IJ SOLN
3.0000 mL | Freq: Two times a day (BID) | INTRAMUSCULAR | Status: DC
Start: 2014-08-22 — End: 2014-08-22
  Administered 2014-08-22: 3 mL via INTRAVENOUS

## 2014-08-22 MED ORDER — SEVELAMER CARBONATE 800 MG PO TABS
2400.0000 mg | ORAL_TABLET | ORAL | Status: DC | PRN
  Filled 2014-08-22: qty 3

## 2014-08-22 MED ORDER — GLYCOPYRROLATE 0.2 MG/ML IJ SOLN
INTRAMUSCULAR | Status: DC | PRN
  Administered 2014-08-22: .8 mg via INTRAVENOUS

## 2014-08-22 MED ORDER — ESMOLOL HCL 10 MG/ML IV SOLN
INTRAVENOUS | Status: DC | PRN
  Administered 2014-08-22: 5 mg via INTRAVENOUS

## 2014-08-22 MED ORDER — ACETAMINOPHEN 325 MG PO TABS: 650.0000 mg | ORAL_TABLET | ORAL | Status: DC | PRN

## 2014-08-22 MED ORDER — ALLOPURINOL 100 MG PO TABS
100.0000 mg | ORAL_TABLET | Freq: Every day | ORAL | Status: DC
  Administered 2014-08-22: 100 mg via ORAL
  Filled 2014-08-22: qty 1

## 2014-08-22 MED ORDER — NEOSTIGMINE METHYLSULFATE 10 MG/10ML IV SOLN
INTRAVENOUS | Status: DC | PRN
  Administered 2014-08-22: 5 mg via INTRAVENOUS

## 2014-08-22 MED ORDER — ALBUTEROL SULFATE (2.5 MG/3ML) 0.083% IN NEBU: 3.0000 mL | INHALATION_SOLUTION | Freq: Four times a day (QID) | RESPIRATORY_TRACT | Status: DC | PRN

## 2014-08-22 MED ORDER — GABAPENTIN 100 MG PO CAPS
100.0000 mg | ORAL_CAPSULE | Freq: Two times a day (BID) | ORAL | Status: DC
  Administered 2014-08-22: 100 mg via ORAL
  Filled 2014-08-22 (×2): qty 1

## 2014-08-22 MED ORDER — WARFARIN - PHYSICIAN DOSING INPATIENT
Freq: Every day | Status: DC
  Administered 2014-08-22: 18:00:00

## 2014-08-22 MED ORDER — INSULIN GLARGINE 100 UNIT/ML ~~LOC~~ SOLN
22.0000 [IU] | Freq: Every day | SUBCUTANEOUS | Status: DC
  Filled 2014-08-22: qty 0.22

## 2014-08-22 MED ORDER — PANTOPRAZOLE SODIUM 40 MG PO TBEC
40.0000 mg | DELAYED_RELEASE_TABLET | Freq: Every day | ORAL | Status: DC
  Administered 2014-08-22: 40 mg via ORAL

## 2014-08-22 MED ORDER — LIDOCAINE HCL (CARDIAC) 20 MG/ML IV SOLN
INTRAVENOUS | Status: DC | PRN
  Administered 2014-08-22: 100 mg via INTRAVENOUS

## 2014-08-22 MED ORDER — PROMETHAZINE HCL 25 MG/ML IJ SOLN: 6.2500 mg | INTRAMUSCULAR | Status: DC | PRN

## 2014-08-22 MED ORDER — ONDANSETRON HCL 4 MG/2ML IJ SOLN
INTRAMUSCULAR | Status: DC | PRN
  Administered 2014-08-22: 4 mg via INTRAVENOUS

## 2014-08-22 MED ORDER — FENTANYL CITRATE 0.05 MG/ML IJ SOLN
INTRAMUSCULAR | Status: DC | PRN
  Administered 2014-08-22: 100 ug via INTRAVENOUS

## 2014-08-22 MED ORDER — EPHEDRINE SULFATE 50 MG/ML IJ SOLN
INTRAMUSCULAR | Status: DC | PRN
  Administered 2014-08-22: 15 mg via INTRAVENOUS
  Administered 2014-08-22: 10 mg via INTRAVENOUS
  Administered 2014-08-22: 15 mg via INTRAVENOUS
  Administered 2014-08-22: 10 mg via INTRAVENOUS

## 2014-08-22 MED ORDER — CINACALCET HCL 30 MG PO TABS
90.0000 mg | ORAL_TABLET | Freq: Every day | ORAL | Status: DC
  Administered 2014-08-22: 90 mg via ORAL
  Filled 2014-08-22 (×2): qty 3

## 2014-08-22 MED ORDER — ROCURONIUM BROMIDE 100 MG/10ML IV SOLN
INTRAVENOUS | Status: DC | PRN
  Administered 2014-08-22: 25 mg via INTRAVENOUS
  Administered 2014-08-22 (×3): 5 mg via INTRAVENOUS

## 2014-08-22 MED ORDER — ALBUTEROL SULFATE (2.5 MG/3ML) 0.083% IN NEBU: 2.5000 mg | INHALATION_SOLUTION | Freq: Four times a day (QID) | RESPIRATORY_TRACT | Status: DC | PRN

## 2014-08-22 MED ORDER — SUCCINYLCHOLINE CHLORIDE 20 MG/ML IJ SOLN
INTRAMUSCULAR | Status: DC | PRN
  Administered 2014-08-22: 140 mg via INTRAVENOUS

## 2014-08-22 MED ORDER — IPRATROPIUM BROMIDE 0.02 % IN SOLN: 0.5000 mg | Freq: Four times a day (QID) | RESPIRATORY_TRACT | Status: DC | PRN

## 2014-08-22 MED ORDER — MEPERIDINE HCL 25 MG/ML IJ SOLN: 6.2500 mg | INTRAMUSCULAR | Status: DC | PRN

## 2014-08-22 NOTE — Progress Notes (Signed)
Nasal airway was dc'd by CRNA upon adm to PACU. Pt has pre existing LA AVF, positive bruit. Right groin procedure site Level 0, gauze/tegaderm dsg. CDI. Pt has no c/o. Will cont to monitor.

## 2014-08-22 NOTE — Transfer of Care (Signed)
Immediate Anesthesia Transfer of Care Note  Patient: Albert Lewis  Procedure(s) Performed: Procedure(s): ATRIAL FLUTTER ABLATION (N/A)  Patient Location: PACU  Anesthesia Type:General  Level of Consciousness: awake, alert  and oriented  Airway & Oxygen Therapy: Patient Spontanous Breathing and Patient connected to nasal cannula oxygen  Post-op Assessment: Report given to PACU RN, Post -op Vital signs reviewed and stable and Patient moving all extremities X 4  Post vital signs: Reviewed and stable  Complications: No apparent anesthesia complications

## 2014-08-22 NOTE — H&P (View-Only) (Signed)
PCP:  Tula Nakayama, MD Primary Cardiologist:  Dr Bronson Ing  The patient presents today for routine electrophysiology followup.  Since his ablation, the patient reports doing reasonably well.  He developed atrial flutter in July for which he went to [facility].  Cardiology/ EP was not consulted.  He was treated with rate control and discharged.  He remains on anticoagulation.  EKG appearance of atrial flutter is different.  He has some SOB but actually is tolerating atrial flutter reasonably well.  Today, he denies symptoms of  chest pain, , orthopnea, PND,   dizziness, presyncope, syncope, or neurologic sequela.  He does have some edema.  The patient feels that he is tolerating medications without difficulties and is otherwise without complaint today.   Past Medical History  Diagnosis Date  . Paroxysmal atrial fibrillation 2/09    Abnormal stress nuclear study in 01/2008; normal coronary angiography  . Hyperlipidemia 2008  . Hypertension 1993    Mild to moderate LVH  . Obesity     sleep apnea treated with BiPAP  . Anemia   . Gastroesophageal reflux disease   . Hyperparathyroidism     Secondary  . Allergic rhinitis   . Lower gastrointestinal bleed 04/2008    diverticulosis; hemorrhoids; constipation  . Diverticular disease   . Chronic back pain     Degenerative disc disease  . ESRD (end stage renal disease) on dialysis     Dialysis since 2007  . Renal insufficiency   . Sleep apnea 2010    CPAP followed at [place]  . Cataract     s/p extraction x 2  . Diabetes mellitus, type 2 1993    no insulin  . Atrial flutter    Past Surgical History  Procedure Laterality Date  . Cataract extraction w/ intraocular lens  implant, bilateral  2008  . Lumbar peritoneal shunt    . Exploratory laparotomy      shunt complications  . Back surgery    . Cholecystectomy  1999  . Spine surgery      twice in lower back  . Atrial flutter ablation  06/18/14    CTI ablation by Dr Rayann Heman    Current  Outpatient Prescriptions  Medication Sig Dispense Refill  . acetaminophen (TYLENOL) 325 MG tablet Take 650 mg by mouth every 6 (six) hours as needed for moderate pain.      Marland Kitchen albuterol (PROVENTIL HFA;VENTOLIN HFA) 108 (90 BASE) MCG/ACT inhaler Inhale 2 puffs into the lungs every 6 (six) hours as needed for wheezing.  1 Inhaler  2  . albuterol (PROVENTIL) (2.5 MG/3ML) 0.083% nebulizer solution Take 3 mLs (2.5 mg total) by nebulization every 6 (six) hours as needed for wheezing or shortness of breath.  75 mL  0  . allopurinol (ZYLOPRIM) 100 MG tablet Take 100 mg by mouth daily.       . B Complex-C-Folic Acid (RENAL MULTIVITAMIN FORMULA PO) Take 1 tablet by mouth daily.      . cinacalcet (SENSIPAR) 90 MG tablet Take 90 mg by mouth daily.      . clindamycin (CLEOCIN) 300 MG capsule Take 1 capsule (300 mg total) by mouth 3 (three) times daily.  42 capsule  9  . diltiazem (CARDIZEM) 60 MG tablet Take 60 mg by mouth 2 (two) times daily.      Marland Kitchen docusate sodium (COLACE) 100 MG capsule Take 200 mg by mouth 2 (two) times daily as needed for moderate constipation.       Marland Kitchen  fish oil-omega-3 fatty acids 1000 MG capsule Take 2 g by mouth 3 (three) times daily. SUPER OMEGA 3 FORMULA: EPA 300/DHA200      . gabapentin (NEURONTIN) 100 MG capsule Take 1 capsule (100 mg total) by mouth 2 (two) times daily.  60 capsule  3  . gemfibrozil (LOPID) 600 MG tablet Take 600 mg by mouth 2 (two) times daily.      Marland Kitchen guaiFENesin (MUCINEX) 600 MG 12 hr tablet Take 2 tablets (1,200 mg total) by mouth 2 (two) times daily.  14 tablet  0  . insulin glargine (LANTUS) 100 UNIT/ML injection Inject 22 Units into the skin at bedtime.       Marland Kitchen ipratropium (ATROVENT) 0.02 % nebulizer solution Take 2.5 mLs (0.5 mg total) by nebulization 4 (four) times daily.  75 mL  0  . pantoprazole (PROTONIX) 40 MG tablet Take 40 mg by mouth daily.      Marland Kitchen PRESCRIPTION MEDICATION Place 1 drop into both eyes 3 (three) times daily. For dry eyes      . sevelamer  (RENVELA) 800 MG tablet Take 4,800 mg by mouth 3 (three) times daily with meals. Takes 6 tablets (4800 mg) three times daily with meals and 3 tablets (2400 mg) with snack.      . warfarin (COUMADIN) 5 MG tablet Take 1 tablet (5 mg total) by mouth every evening. Per Lattie Haw  90 tablet  3   No current facility-administered medications for this visit.    Allergies  Allergen Reactions  . Actos [Pioglitazone]   . Celebrex [Celecoxib]   . Penicillins Other (See Comments)    Couldn't walk.     History   Social History  . Marital Status: Divorced    Spouse Name: N/A    Number of Children: 2  . Years of Education: N/A   Occupational History  . Not on file.   Social History Main Topics  . Smoking status: Former Smoker    Types: Cigarettes  . Smokeless tobacco: Never Used  . Alcohol Use: No  . Drug Use: No  . Sexual Activity: Yes   Other Topics Concern  . Not on file   Social History Narrative   Married lives with spouse since 56. Patient has 11 siblings .    Family History  Problem Relation Age of Onset  . Hypertension Mother   . Cancer Mother 65    breast   . Hypertension Sister 70  . Hypertension Daughter 66  . Diabetes Daughter     ROS-  All systems are reviewed and are negative except as outlined in the HPI above  Physical Exam: Filed Vitals:   08/08/14 1413  BP: 150/54  Pulse: 105  Height: 6\' 2"  (1.88 m)  Weight: 257 lb (116.574 kg)    GEN- The patient is chronically ill appearing, alert and oriented x 3 today.   Head- normocephalic, atraumatic Eyes-  Sclera clear, conjunctiva pink Ears- hearing intact Oropharynx- clear Neck- supple  Lungs- Clear to ausculation bilaterally, normal work of breathing Heart- irregular rate and rhythm  GI- soft, NT, ND, + BS Extremities- no clubbing, cyanosis, + edema MS- no significant deformity or atrophy Skin- no rash or lesion Psych- euthymic mood, full affect Neuro- strength and sensation are intact  ekg reveals  atrial flutter  Assessment and Plan:  1. Atrial flutter- the patient is s/p ablation for isthmus dependant RA flutter 6/15.  He now presents with an atrial flutter which is different by ekg appearance.  This could be clockwise isthmus dependant atrial flutter but could also represent another flutter circuit. Therapeutic strategies for atrial flutter including medicine and ablation were discussed in detail with the patient today. Risk, benefits, and alternatives to EP study and radiofrequency ablation were also discussed in detail today. These risks include but are not limited to stroke, bleeding, vascular damage, tamponade, perforation, damage to the heart and other structures, AV block requiring pacemaker, worsening renal function, and death. The patient understands these risk and wishes to proceed.  We will therefore proceed with catheter ablation at the next available time. Continue anticoagulation with weekly INRs. He will require Carto and anesthesia for this case.  2. OSA Compliance with BiPAP encouraged  3. ESRD Stable No change required today

## 2014-08-22 NOTE — Op Note (Signed)
SURGEON:  Thompson Grayer, MD      PREPROCEDURE DIAGNOSIS:  Atrial flutter.      POSTPROCEDURE DIAGNOSIS:  Isthmus-dependent counter clockwise right atrial flutter.      PROCEDURES:   1. Comprehensive EP study.   2. Coronary sinus pacing and recording.   3. 3D Mapping of supraventricular tachycardia.   4. Radiofrequency ablation of supraventricular tachycardia.   5. Arrhythmia induction with pacing     INTRODUCTION: Albert Lewis is a 63 y.o. male with a history of atrial flutter who presents today for EP study and radiofrequency ablation.  He previously underwent atrial flutter ablation by me 06/18/14. The patient subsequently developed symptoms of weakness and fatigue for which he  was found to have recurrent atrial flutter with elevated ventricular rates.  His atrial flutter is somewhat atypical in appearance.  The patient remains symptomatic despite rate control.  The patient has been adequately anticoagulated for three weeks and now presents for EP study and radiofrequency ablation of atrial flutter.      DESCRIPTION OF PROCEDURE:  Informed written consent was obtained and the patient was brought to the Electrophysiology Lab in the fasting state.   The patient was adequately sedated with intravenous medication as outlined in the anesthesia report.  The patient's right groin was prepped and draped in the usual sterile fashion by the EP Lab staff.  Using a percutaneous Seldinger technique, a 6-French 7 [place] and one 8-French hemostasis sheaths were placed into the right common femoral vein.  A 7-French decapolar BsW decapolar catheter was introduced through the right common femoral vein and advanced into the coronary sinus for recording and pacing from this location.  CS access was challenging.  A 6-French quadripolar Josephson catheter was introduced through the right common femoral vein and advanced into the right ventricle for recording and pacing.  This catheter was then pulled back to the His  bundle location.    Presenting Measurements: The patient presented to the Electrophysiology Lab in atrial flutter.  The surface electrocardiogram was consistent with somewhat atypical atrial flutter.  The atrial flutter cycle length was 260 milliseconds.  The coronary sinus catheter activation revealed proximal to distal activation and was therefore suggestive of right atrial flutter.  The patient's QRS duration was 106 milliseconds with a QT interval of 385 milliseconds and an HV interval of 41 milliseconds.   Entrainment and Mapping: Entrainment was performed from the left atrium, which revealed a long postpacing interval.  A Biosense Webster 7 mm ablation catheter was introduced through the right common femoral vein and advanced into the right atrium.  The catheter was positioned along the cavotricuspid isthmus.  Entrainment mapping was performed from the cavotricuspid isthmus.  The postpacing interval was equal to the tachycardia cycle length when pacing in this location during entrainment.  The patient was therefore felt to have isthmus-dependent right atrial flutter.  3D Mapping was then performed.  This demonstrated CCW annular rotation around the tricuspid valve with the majority of the tachycardia mapped from the right atrium.  I therefore elected to perform cavotricuspid isthmus ablation today.   Ablation: The ablation catheter was therefore positioned along the cavotricuspid isthmus and a series of radiofrequency applications were delivered with a target temperature of 60 degrees of 70 watts.  The tachycardia slowed and then terminated during radiofrequency application.  Additional mapping of the atrial signal was performed with  additional ablation performed.  A 7-French Biosense Webster dual decapolar halo catheter was introduced through the right common femoral  vein and advanced into the right atrium.  This catheter was positioned around the tricuspid valve annulus.  This demonstrated complete  bidirectional isthmus block as evident by differential atrial pacing from the low lateral right atrium.  The patient was observed for 20 minutes without return of conduction through the cavotricuspid isthmus.  The stimulus to earliest activation across the isthmus bidirectional measured 170 msec.  Measurements following ablation: Following ablation, the AH interval measured 133 milliseconds with an HV interval of 52 milliseconds.  Atrial pacing was performed, which revealed decremental AV conduction with no evidence of PR greater than RR.  The AV Wenckebach cycle length was 430 milliseconds.  Atrial pacing was continued down to a cycle length of 220 milliseconds with no arrhythmias induced.  He was noted to have frequent PACs. Ventricular pacing was performed, which revealed VA dissociation when pacing at 600 milliseconds.  No arrhythmias were induced. The procedure was therefore considered completed.  All catheters were removed and the sheaths were aspirated and flushed.  The sheaths were removed and hemostasis was assured. EBL<31ml.  There were no early apparent complications.      CONCLUSIONS:   1. Recurrent CCW Isthmus-dependent right atrial flutter upon presentation.   2. Successful radiofrequency ablation of atrial flutter along the cavotricuspid isthmus with complete bidirectional isthmus block achieved.   3. No inducible arrhythmias following ablation.   4. No early apparent complications.

## 2014-08-22 NOTE — Anesthesia Postprocedure Evaluation (Signed)
  Anesthesia Post-op Note  Patient: Albert Lewis  Procedure(s) Performed: Procedure(s): ATRIAL FLUTTER ABLATION (N/A)  Patient Location: PACU  Anesthesia Type:General  Level of Consciousness: awake, alert  and oriented  Airway and Oxygen Therapy: Patient Spontanous Breathing and Patient connected to nasal cannula oxygen  Post-op Pain: none  Post-op Assessment: Post-op Vital signs reviewed, Patient's Cardiovascular Status Stable, Respiratory Function Stable, No signs of Nausea or vomiting and Pain level controlled  Post-op Vital Signs: Reviewed and stable  Last Vitals:  Filed Vitals:   08/22/14 1045  BP:   Pulse: 99  Temp:   Resp: 13    Complications: No apparent anesthesia complications

## 2014-08-22 NOTE — Progress Notes (Signed)
Doing well s/p ablation Maintaining sinus rhythm  Physical Exam: Filed Vitals:   08/22/14 1230 08/22/14 1300 08/22/14 1321 08/22/14 1400  BP: 129/69 119/65  127/61  Pulse: 81 77 103 87  Temp:      TempSrc:      Resp:   16   Height:      Weight:      SpO2:   99%     GEN- NAD, alert and oriented x 3 today.   Head- normocephalic, atraumatic Eyes-  Sclera clear, conjunctiva pink Ears- hearing intact Oropharynx- clear Neck- supple, Lungs- Clear to ausculation bilaterally, normal work of breathing Heart- Regular rate and rhythm  GI- soft, NT, ND, + BS Extremities- no clubbing, cyanosis, or edema, groin is without hematoma/ bruit Neuro- strength and sensation are intact  A/P 1. S/p atrial flutter ablation Discharge to home Routine wound care  follow-up with me in 4 weeks The importance of compliance with coumadin was stressed today

## 2014-08-22 NOTE — Discharge Instructions (Addendum)
Information on my medicine - Coumadin   (Warfarin)  Why was Coumadin prescribed for you? Coumadin was prescribed for you because you have a blood clot or a medical condition that can cause an increased risk of forming blood clots. Blood clots can cause serious health problems by blocking the flow of blood to the heart, lung, or brain. Coumadin can prevent harmful blood clots from forming. As a reminder your indication for Coumadin is:   Stroke Prevention Because Of Atrial Fibrillation  What test will check on my response to Coumadin? While on Coumadin (warfarin) you will need to have an INR test regularly to ensure that your dose is keeping you in the desired range. The INR (international normalized ratio) number is calculated from the result of the laboratory test called prothrombin time (PT).  If an INR APPOINTMENT HAS NOT ALREADY BEEN MADE FOR YOU please schedule an appointment to have this lab work done by your health care provider within 7 days. Your INR goal is a number between:  2 to 3  What  do you need to  know  About  COUMADIN? Take Coumadin (warfarin) exactly as prescribed by your healthcare provider about the same time each day.  DO NOT stop taking without talking to the doctor who prescribed the medication.  Stopping without other blood clot prevention medication to take the place of Coumadin may increase your risk of developing a new clot or stroke.  Get refills before you run out.  What do you do if you miss a dose? If you miss a dose, take it as soon as you remember on the same day then continue your regularly scheduled regimen the next day.  Do not take two doses of Coumadin at the same time.  Important Safety Information A possible side effect of Coumadin (Warfarin) is an increased risk of bleeding. You should call your healthcare provider right away if you experience any of the following:   Bleeding from an injury or your nose that does not stop.   Unusual colored urine (red  or dark brown) or unusual colored stools (red or black).   Unusual bruising for unknown reasons.   A serious fall or if you hit your head (even if there is no bleeding).  Some foods or medicines interact with Coumadin (warfarin) and might alter your response to warfarin. To help avoid this:   Eat a balanced diet, maintaining a consistent amount of Vitamin K.   Notify your provider about major diet changes you plan to make.   Avoid alcohol or limit your intake to 1 drink for women and 2 drinks for men per day. (1 drink is 5 oz. wine, 12 oz. beer, or 1.5 oz. liquor.)  Make sure that ANY health care provider who prescribes medication for you knows that you are taking Coumadin (warfarin).  Also make sure the healthcare provider who is monitoring your Coumadin knows when you have started a new medication including herbals and non-prescription products.  Coumadin (Warfarin)  Major Drug Interactions  Increased Warfarin Effect Decreased Warfarin Effect  Alcohol (large quantities) Antibiotics (esp. Septra/Bactrim, Flagyl, Cipro) Amiodarone (Cordarone) Aspirin (ASA) Cimetidine (Tagamet) Megestrol (Megace) NSAIDs (ibuprofen, naproxen, etc.) Piroxicam (Feldene) Propafenone (Rythmol SR) Propranolol (Inderal) Isoniazid (INH) Posaconazole (Noxafil) Barbiturates (Phenobarbital) Carbamazepine (Tegretol) Chlordiazepoxide (Librium) Cholestyramine (Questran) Griseofulvin Oral Contraceptives Rifampin Sucralfate (Carafate) Vitamin K   Coumadin (Warfarin) Major Herbal Interactions  Increased Warfarin Effect Decreased Warfarin Effect  Garlic Ginseng Ginkgo biloba Coenzyme Q10 Green tea St. Johns wort  Coumadin (Warfarin) FOOD Interactions  Eat a consistent number of servings per week of foods HIGH in Vitamin K (1 serving =  cup)  Collards (cooked, or boiled & drained) Kale (cooked, or boiled & drained) Mustard greens (cooked, or boiled & drained) Parsley *serving size only =   cup Spinach (cooked, or boiled & drained) Swiss chard (cooked, or boiled & drained) Turnip greens (cooked, or boiled & drained)  Eat a consistent number of servings per week of foods MEDIUM-HIGH in Vitamin K (1 serving = 1 cup)  Asparagus (cooked, or boiled & drained) Broccoli (cooked, boiled & drained, or raw & chopped) Brussel sprouts (cooked, or boiled & drained) *serving size only =  cup Lettuce, raw (green leaf, endive, romaine) Spinach, raw Turnip greens, raw & chopped   These websites have more information on Coumadin (warfarin):  FailFactory.se; VeganReport.com.au;        No driving for 5 days. No lifting over 5 lbs for 1 week. No sexual activity for 1 week. You may return to work in 1 week. Keep procedure site clean & dry. If you notice increased pain, swelling, bleeding or pus, call/return!  You may shower, but no soaking baths/hot tubs/pools for 1 week.

## 2014-08-22 NOTE — Anesthesia Procedure Notes (Signed)
Procedure Name: Intubation Date/Time: 08/22/2014 7:46 AM Performed by: Jacob Moores Pre-anesthesia Checklist: Patient identified, Emergency Drugs available, Suction available and Patient being monitored Patient Re-evaluated:Patient Re-evaluated prior to inductionOxygen Delivery Method: Circle system utilized Preoxygenation: Pre-oxygenation with 100% oxygen Intubation Type: IV induction Ventilation: Mask ventilation without difficulty and Oral airway inserted - appropriate to patient size Laryngoscope Size: Sabra Heck and 2 Grade View: Grade I Tube type: Oral Tube size: 7.5 mm Number of attempts: 1 Airway Equipment and Method: Stylet and Oral airway Placement Confirmation: ETT inserted through vocal cords under direct vision,  positive ETCO2 and breath sounds checked- equal and bilateral Secured at: 23 cm Tube secured with: Tape Dental Injury: Teeth and Oropharynx as per pre-operative assessment

## 2014-08-22 NOTE — Interval H&P Note (Signed)
History and Physical Interval Note:  08/22/2014 7:53 AM  [name]  has presented today for surgery, with the diagnosis of flutter  The various methods of treatment have been discussed with the patient and family. After consideration of risks, benefits and other options for treatment, the patient has consented to  Procedure(s): ATRIAL FLUTTER ABLATION (N/A) as a surgical intervention .  The patient's history has been reviewed, patient examined, no change in status, stable for surgery.  I have reviewed the patient's chart and labs.  Questions were answered to the patient's satisfaction.     Albert Lewis

## 2014-08-22 NOTE — Progress Notes (Signed)
Pt ambulated to end of hall and back, no c/o pain or SOB, r groin level 0, will continue to monitor.

## 2014-08-23 ENCOUNTER — Encounter: Payer: Self-pay | Admitting: Internal Medicine

## 2014-08-27 ENCOUNTER — Ambulatory Visit (INDEPENDENT_AMBULATORY_CARE_PROVIDER_SITE_OTHER): Payer: Medicare Other | Admitting: *Deleted

## 2014-08-27 DIAGNOSIS — Z7902 Long term (current) use of antithrombotics/antiplatelets: Secondary | ICD-10-CM

## 2014-08-27 DIAGNOSIS — I4891 Unspecified atrial fibrillation: Secondary | ICD-10-CM

## 2014-08-27 LAB — POCT INR: INR: 4.1

## 2014-09-03 ENCOUNTER — Ambulatory Visit (INDEPENDENT_AMBULATORY_CARE_PROVIDER_SITE_OTHER): Payer: Medicare Other | Admitting: *Deleted

## 2014-09-03 DIAGNOSIS — I4891 Unspecified atrial fibrillation: Secondary | ICD-10-CM

## 2014-09-03 DIAGNOSIS — Z7902 Long term (current) use of antithrombotics/antiplatelets: Secondary | ICD-10-CM

## 2014-09-03 LAB — POCT INR: INR: 3.7

## 2014-09-06 ENCOUNTER — Encounter: Payer: Medicare Other | Admitting: Internal Medicine

## 2014-09-10 ENCOUNTER — Ambulatory Visit (INDEPENDENT_AMBULATORY_CARE_PROVIDER_SITE_OTHER): Payer: Medicare Other | Admitting: *Deleted

## 2014-09-10 DIAGNOSIS — I4891 Unspecified atrial fibrillation: Secondary | ICD-10-CM

## 2014-09-10 DIAGNOSIS — Z7902 Long term (current) use of antithrombotics/antiplatelets: Secondary | ICD-10-CM

## 2014-09-10 LAB — POCT INR: INR: 2.2

## 2014-09-23 ENCOUNTER — Encounter: Payer: Self-pay | Admitting: Internal Medicine

## 2014-09-23 ENCOUNTER — Ambulatory Visit (INDEPENDENT_AMBULATORY_CARE_PROVIDER_SITE_OTHER): Payer: Medicare Other | Admitting: Internal Medicine

## 2014-09-23 VITALS — BP 108/60 | HR 99 | Ht 74.0 in | Wt 251.8 lb

## 2014-09-23 DIAGNOSIS — Z9889 Other specified postprocedural states: Principal | ICD-10-CM

## 2014-09-23 DIAGNOSIS — N186 End stage renal disease: Secondary | ICD-10-CM

## 2014-09-23 DIAGNOSIS — I483 Typical atrial flutter: Secondary | ICD-10-CM

## 2014-09-23 DIAGNOSIS — I4892 Unspecified atrial flutter: Secondary | ICD-10-CM

## 2014-09-23 DIAGNOSIS — G4733 Obstructive sleep apnea (adult) (pediatric): Secondary | ICD-10-CM

## 2014-09-23 DIAGNOSIS — Z8679 Personal history of other diseases of the circulatory system: Secondary | ICD-10-CM

## 2014-09-23 NOTE — Progress Notes (Signed)
PCP:  Tula Nakayama, MD Primary Cardiologist:  Dr Bronson Ing  The patient presents today for routine electrophysiology followup.  Since his ablation, the patient reports doing reasonably well.  He is unaware of any further atrial arrhythmias.  Today, he denies symptoms of  chest pain, orthopnea, PND,   dizziness, presyncope, syncope, or neurologic sequela.  He does have some edema.  He has tried support hose through the [place] but could not tolerate these due to discomfort.  He will ask them for a different strength pair when he returns.  The patient feels that he is tolerating medications without difficulties and is otherwise without complaint today.   Past Medical History  Diagnosis Date  . Paroxysmal atrial fibrillation 2/09    Abnormal stress nuclear study in 01/2008; normal coronary angiography  . Hyperlipidemia 2008  . Hypertension 1993    Mild to moderate LVH  . Obesity     sleep apnea treated with BiPAP  . Anemia   . Gastroesophageal reflux disease   . Hyperparathyroidism     Secondary  . Allergic rhinitis   . Lower gastrointestinal bleed 04/2008    diverticulosis; hemorrhoids; constipation  . Diverticular disease   . Chronic back pain     Degenerative disc disease  . ESRD (end stage renal disease) on dialysis     Dialysis since 2007  . Renal insufficiency   . Sleep apnea 2010    CPAP followed at [place]  . Cataract     s/p extraction x 2  . Diabetes mellitus, type 2 1993    no insulin  . Atrial flutter    Past Surgical History  Procedure Laterality Date  . Cataract extraction w/ intraocular lens  implant, bilateral  2008  . Lumbar peritoneal shunt    . Exploratory laparotomy      shunt complications  . Back surgery    . Cholecystectomy  1999  . Spine surgery      twice in lower back  . Atrial flutter ablation  06/18/14, 08/22/14    CTI ablation x 2 by Dr Rayann Heman    Current Outpatient Prescriptions  Medication Sig Dispense Refill  . acetaminophen (TYLENOL) 325 MG  tablet Take 650 mg by mouth every 6 (six) hours as needed for moderate pain.      Marland Kitchen albuterol (PROVENTIL HFA;VENTOLIN HFA) 108 (90 BASE) MCG/ACT inhaler Inhale 2 puffs into the lungs every 6 (six) hours as needed for wheezing.  1 Inhaler  2  . albuterol (PROVENTIL) (2.5 MG/3ML) 0.083% nebulizer solution Take 3 mLs (2.5 mg total) by nebulization every 6 (six) hours as needed for wheezing or shortness of breath.  75 mL  0  . allopurinol (ZYLOPRIM) 100 MG tablet Take 100 mg by mouth daily.       . B Complex-C-Folic Acid (RENAL MULTIVITAMIN FORMULA PO) Take 1 tablet by mouth daily.      . cetirizine (ZYRTEC) 10 MG tablet Take 10 mg by mouth daily as needed for allergies.      . cinacalcet (SENSIPAR) 90 MG tablet Take 90 mg by mouth daily.      Marland Kitchen docusate sodium (COLACE) 100 MG capsule Take 200 mg by mouth 2 (two) times daily as needed for moderate constipation.       . fish oil-omega-3 fatty acids 1000 MG capsule Take 2 g by mouth 3 (three) times daily. SUPER OMEGA 3 FORMULA: EPA 300/DHA20      . gabapentin (NEURONTIN) 100 MG capsule Take 1 capsule (100  mg total) by mouth 2 (two) times daily.  60 capsule  3  . gemfibrozil (LOPID) 600 MG tablet Take 600 mg by mouth 2 (two) times daily.      Marland Kitchen guaiFENesin (MUCINEX) 600 MG 12 hr tablet Take 1,200 mg by mouth 2 (two) times daily as needed for cough or to loosen phlegm.      . insulin glargine (LANTUS) 100 UNIT/ML injection Inject 22 Units into the skin at bedtime.       Marland Kitchen ipratropium (ATROVENT) 0.02 % nebulizer solution Take 2.5 mLs (0.5 mg total) by nebulization 4 (four) times daily.  75 mL  0  . pantoprazole (PROTONIX) 40 MG tablet Take 40 mg by mouth daily.      Marland Kitchen PRESCRIPTION MEDICATION Place 1 drop into both eyes 3 (three) times daily. For dry eyes      . sevelamer (RENVELA) 800 MG tablet Take 2,400-4,800 mg by mouth See admin instructions. Takes 6 tablets (4800 mg) three times daily with meals and 3 tablets (2400 mg) with snack.      . warfarin  (COUMADIN) 5 MG tablet Take 5-7.5 mg by mouth every evening. Takes 5mg  every day except Tuesday takes 7.5mg       . clindamycin (CLEOCIN) 300 MG capsule Take 300 mg by mouth 3 (three) times daily.       No current facility-administered medications for this visit.    Allergies  Allergen Reactions  . Celebrex [Celecoxib] Other (See Comments)    Heart sped up  . Penicillins Other (See Comments)    Couldn't walk.   . Actos [Pioglitazone] Other (See Comments)    unknown    History   Social History  . Marital Status: Divorced    Spouse Name: N/A    Number of Children: 2  . Years of Education: N/A   Occupational History  . Not on file.   Social History Main Topics  . Smoking status: Former Smoker    Types: Cigarettes  . Smokeless tobacco: Never Used  . Alcohol Use: No  . Drug Use: No  . Sexual Activity: Yes   Other Topics Concern  . Not on file   Social History Narrative   Married lives with spouse since 40. Patient has 11 siblings .    Family History  Problem Relation Age of Onset  . Hypertension Mother   . Cancer Mother 37    breast   . Hypertension Sister 53  . Hypertension Daughter 23  . Diabetes Daughter     ROS-  + sinus pressure, itchy eyes, cough, sleep apnea (currently unable to wear CPAP due to nasal congestion) All systems are reviewed and are negative except as outlined in the HPI above  Physical Exam: Filed Vitals:   09/23/14 1547  BP: 108/60  Pulse: 99  Height: 6\' 2"  (1.88 m)  Weight: 251 lb 12.8 oz (114.216 kg)    GEN- The patient is chronically ill appearing, alert and oriented x 3 today.   Head- normocephalic, atraumatic Eyes-  Sclera clear, conjunctiva pink Ears- hearing intact Oropharynx- clear Neck- supple  Lungs- Clear to ausculation bilaterally, normal work of breathing Heart- irregular rate and rhythm  GI- soft, NT, ND, + BS Extremities- no clubbing, cyanosis, + edema Neuro- strength and sensation are intact  ekg reveals  sinus 99 bpm, PR 232, LVH  Assessment and Plan:  1. Atrial flutter- doing well s/p ablation Stop coumadin.  If he develops atrial fibrillation or other arrhythmias in the future then  he should restart coumadin at that time.  2. OSA Compliance with BiPAP encouraged.  He will follow-up with the [place] where he receives care for OSA.  3. ESRD Stable No change required today  [facility] to see in 3 months Return to see me as needed

## 2014-09-23 NOTE — Patient Instructions (Addendum)
Your physician recommends that you schedule a follow-up appointment in: Dr. Bronson Ing in 3 months and Dr Rayann Heman as needed  Your physician has recommended you make the following change in your medication:  1) Stop Coumadin

## 2014-09-30 ENCOUNTER — Telehealth: Payer: Self-pay | Admitting: Cardiovascular Disease

## 2014-09-30 NOTE — Telephone Encounter (Signed)
This is not due to being off warfarin which is for prevention of left atrial clotting. Can be addressed by his nephrologist.

## 2014-09-30 NOTE — Telephone Encounter (Signed)
Patient walked in to ask about seeing nurse about him clotting while on dialysis machine.  He was taken off blood thinner by Dr Rayann Heman [phone] cell

## 2014-09-30 NOTE — Telephone Encounter (Signed)
Patient notified and verbalized understanding. 

## 2014-10-01 ENCOUNTER — Telehealth: Payer: Self-pay | Admitting: *Deleted

## 2014-10-01 NOTE — Telephone Encounter (Signed)
Pt called stating his blood is still clotting up. Please advise [phone] or [phone]

## 2014-10-01 NOTE — Telephone Encounter (Signed)
Called patient to advise that he should contact Coumadin clinic/ Cardio.

## 2014-10-15 ENCOUNTER — Encounter: Payer: Self-pay | Admitting: Cardiovascular Disease

## 2014-10-15 ENCOUNTER — Ambulatory Visit (INDEPENDENT_AMBULATORY_CARE_PROVIDER_SITE_OTHER): Payer: Medicare Other | Admitting: Cardiovascular Disease

## 2014-10-15 VITALS — BP 133/70 | HR 90 | Ht 74.0 in | Wt 252.0 lb

## 2014-10-15 DIAGNOSIS — N186 End stage renal disease: Secondary | ICD-10-CM

## 2014-10-15 DIAGNOSIS — I480 Paroxysmal atrial fibrillation: Secondary | ICD-10-CM

## 2014-10-15 DIAGNOSIS — I4892 Unspecified atrial flutter: Secondary | ICD-10-CM

## 2014-10-15 DIAGNOSIS — G4733 Obstructive sleep apnea (adult) (pediatric): Secondary | ICD-10-CM

## 2014-10-15 DIAGNOSIS — Z9889 Other specified postprocedural states: Principal | ICD-10-CM

## 2014-10-15 DIAGNOSIS — Z8679 Personal history of other diseases of the circulatory system: Secondary | ICD-10-CM

## 2014-10-15 DIAGNOSIS — I429 Cardiomyopathy, unspecified: Secondary | ICD-10-CM

## 2014-10-15 DIAGNOSIS — E1122 Type 2 diabetes mellitus with diabetic chronic kidney disease: Secondary | ICD-10-CM

## 2014-10-15 NOTE — Patient Instructions (Signed)
Continue all current medications. Your physician wants you to follow up in: 6 months.  You will receive a reminder letter in the mail one-two months in advance.  If you don't receive a letter, please call our office to schedule the follow up appointment   

## 2014-10-15 NOTE — Progress Notes (Signed)
Patient ID: Albert Lewis, male   DOB: 08/01/1951, 63 y.o.   MRN: [id number]      SUBJECTIVE: The patient returns for routine cardiovascular follow-up. He initially underwent radiofrequency ablation for atrial flutter on 06/18/2014. He developed a recurrence and the ECG had a different appearance from his initial flutter. He had a subsequent ablation on 08/22/2014 by Dr. Rayann Heman. He is no longer on warfarin. He has been doing well and denies chest pain, palpitations, and shortness of breath. ECG performed in the office today demonstrates normal sinus rhythm with first-degree AV block, PR interval 224 ms. Echo at [facility] showed EF 55-60% in 04/2014, with TEE following month at [facility] showing EF 35-40%, in setting of rapid atrial flutter. Primary problems today related to left knee arthritic pain and b/l feet neuropathy.  Review of Systems: As per "subjective", otherwise negative.  Allergies  Allergen Reactions  . Celebrex [Celecoxib] Other (See Comments)    Heart sped up  . Penicillins Other (See Comments)    Couldn't walk.   . Actos [Pioglitazone] Other (See Comments)    unknown    Current Outpatient Prescriptions  Medication Sig Dispense Refill  . acetaminophen (TYLENOL) 325 MG tablet Take 650 mg by mouth every 6 (six) hours as needed for moderate pain.      Marland Kitchen albuterol (PROVENTIL HFA;VENTOLIN HFA) 108 (90 BASE) MCG/ACT inhaler Inhale 2 puffs into the lungs every 6 (six) hours as needed for wheezing.  1 Inhaler  2  . albuterol (PROVENTIL) (2.5 MG/3ML) 0.083% nebulizer solution Take 3 mLs (2.5 mg total) by nebulization every 6 (six) hours as needed for wheezing or shortness of breath.  75 mL  0  . allopurinol (ZYLOPRIM) 100 MG tablet Take 100 mg by mouth daily.       . B Complex-C-Folic Acid (RENAL MULTIVITAMIN FORMULA PO) Take 1 tablet by mouth daily.      . cetirizine (ZYRTEC) 10 MG tablet Take 10 mg by mouth daily as needed for allergies.      . cinacalcet (SENSIPAR) 90  MG tablet Take 90 mg by mouth daily.      . clindamycin (CLEOCIN) 300 MG capsule Take 300 mg by mouth 3 (three) times daily.      Marland Kitchen docusate sodium (COLACE) 100 MG capsule Take 200 mg by mouth 2 (two) times daily as needed for moderate constipation.       . fish oil-omega-3 fatty acids 1000 MG capsule Take 2 g by mouth 3 (three) times daily. SUPER OMEGA 3 FORMULA: EPA 300/DHA20      . gabapentin (NEURONTIN) 100 MG capsule Take 1 capsule (100 mg total) by mouth 2 (two) times daily.  60 capsule  3  . gemfibrozil (LOPID) 600 MG tablet Take 600 mg by mouth 2 (two) times daily.      Marland Kitchen guaiFENesin (MUCINEX) 600 MG 12 hr tablet Take 1,200 mg by mouth 2 (two) times daily as needed for cough or to loosen phlegm.      . insulin glargine (LANTUS) 100 UNIT/ML injection Inject 22 Units into the skin at bedtime.       Marland Kitchen ipratropium (ATROVENT) 0.02 % nebulizer solution Take 2.5 mLs (0.5 mg total) by nebulization 4 (four) times daily.  75 mL  0  . pantoprazole (PROTONIX) 40 MG tablet Take 40 mg by mouth daily.      Marland Kitchen PRESCRIPTION MEDICATION Place 1 drop into both eyes 3 (three) times daily. For dry eyes      .  sevelamer (RENVELA) 800 MG tablet Take 2,400-4,800 mg by mouth See admin instructions. Takes 6 tablets (4800 mg) three times daily with meals and 3 tablets (2400 mg) with snack.       No current facility-administered medications for this visit.    Past Medical History  Diagnosis Date  . Paroxysmal atrial fibrillation 2/09    Abnormal stress nuclear study in 01/2008; normal coronary angiography  . Hyperlipidemia 2008  . Hypertension 1993    Mild to moderate LVH  . Obesity     sleep apnea treated with BiPAP  . Anemia   . Gastroesophageal reflux disease   . Hyperparathyroidism     Secondary  . Allergic rhinitis   . Lower gastrointestinal bleed 04/2008    diverticulosis; hemorrhoids; constipation  . Diverticular disease   . Chronic back pain     Degenerative disc disease  . ESRD (end stage renal  disease) on dialysis     Dialysis since 2007  . Renal insufficiency   . Sleep apnea 2010    CPAP followed at [place]  . Cataract     s/p extraction x 2  . Diabetes mellitus, type 2 1993    no insulin  . Atrial flutter     Past Surgical History  Procedure Laterality Date  . Cataract extraction w/ intraocular lens  implant, bilateral  2008  . Lumbar peritoneal shunt    . Exploratory laparotomy      shunt complications  . Back surgery    . Cholecystectomy  1999  . Spine surgery      twice in lower back  . Atrial flutter ablation  06/18/14, 08/22/14    CTI ablation x 2 by Dr Rayann Heman    History   Social History  . Marital Status: Divorced    Spouse Name: N/A    Number of Children: 2  . Years of Education: N/A   Occupational History  . Not on file.   Social History Main Topics  . Smoking status: Former Smoker    Types: Cigarettes  . Smokeless tobacco: Never Used  . Alcohol Use: No  . Drug Use: No  . Sexual Activity: Yes   Other Topics Concern  . Not on file   Social History Narrative   Married lives with spouse since 53. Patient has 11 siblings .     Filed Vitals:   10/15/14 0854  Height: 6\' 2"  (1.88 m)  Weight: 252 lb (114.306 kg)   BP 133/70  Pulse 90   PHYSICAL EXAM General: NAD HEENT: Normal. Neck: No JVD, no thyromegaly. Lungs: Clear to auscultation bilaterally with normal respiratory effort. CV: Nondisplaced PMI.  Regular rate and rhythm, normal S1/S2, no S3/S4, no murmur. No pretibial or periankle edema.  No carotid bruit.  Normal pedal pulses.  Abdomen: Soft, nontender, no hepatosplenomegaly, no distention.  Neurologic: Alert and oriented x 3.  Psych: Normal affect. Skin: Normal. Musculoskeletal: Normal range of motion, no gross deformities. Extremities: No clubbing or cyanosis.   ECG: Most recent ECG reviewed.      ASSESSMENT AND PLAN: 1. Atrial flutter with RVR s/p two separate ablations: Stable and without recurrence. No changes to  medical therapy. No anticoagulation warranted at this time. 2. Essential HTN: Well controlled.  3. ESRD on dialysis: Dialyzes M,W,F.  4. Cardiomyopathy: LV systolic function assessment by TEE likely not as accurate as TTE in 04/2014. Will repeat in the future but not required at the present time. Fluid management as per nephrology/dialysis. 5. Type  2 diabetes: On insulin. Complaints of b/l feet neuropathy. Due to see Dr. Moshe Cipro (PCP) on 11/12.  Dispo: f/u 6 months.  Kate Sable, M.D., F.A.C.C.

## 2014-10-21 NOTE — Assessment & Plan Note (Signed)
Recently hospitalized and in foir follow up. Currently rate controlled but Albert Lewis has been a repeated problem in recent weeks Close cardiology follow up and ongoing care

## 2014-10-21 NOTE — Progress Notes (Signed)
   Subjective:    Patient ID: Albert Lewis, male    DOB: 1951/06/12, 63 y.o.   MRN: [id number]  HPI Pt in for f/u for recent hospitalization for a fib with RVR. He has had repeated episodes of afib/flutter in recent past and is followed by cardiology for this. Currently denies palpitations, chest pain or leg swelling C/o frontal pressure, headache and thick green nasal drainage with chills present for over 1 week and worsening . Continues to get most of his care through the [place], with the exception of specialty services involving nephrology and now cardiology   Review of Systems See HPI  Denies chest congestion, productive cough or wheezing. Denies chest pains, and leg swelling Denies abdominal pain, nausea, vomiting,diarrhea or constipation.   Denies dysuria, frequency, hesitancy or incontinence. Denies joint pain, swelling and limitation in mobility. Denies headaches, seizures, numbness, or tingling. Denies depression, anxiety or insomnia. Denies skin break down or rash.        Objective:   Physical Exam  BP 130/68 mmHg  Pulse 94  Resp 16  Ht 6\' 2"  (1.88 m)  Wt 255 lb 1.9 oz (115.722 kg)  BMI 32.74 kg/m2  SpO2 92% Patient alert and oriented and in no cardiopulmonary distress.  HEENT: No facial asymmetry, EOMI,   oropharynx pink and moist.  Neck supple no JVD, no mass. Frontal sinus tender, TM clear, oropharynx, no erythema or exudate Chest: Clear to auscultation bilaterally.  CVS: S1, S2 no murmurs, no S3.Regular rate.  ABD: Soft non tender.   Ext: No edema  MS: Adequate ROM spine, shoulders, hips and knees.  Skin: Intact, no ulcerations or rash noted.  Psych: Good eye contact, normal affect. Memory intact not anxious or depressed appearing.  CNS: CN 2-12 intact, power,  normal throughout.no focal deficits noted.       Assessment & Plan:  Sinusitis, chronic Three weeks of cleocin prescribed. Sinus films if remains symptomatic  Atrial flutter with  rapid ventricular response Recently hospitalized and in foir follow up. Currently rate controlled but Amelia Jo has been a repeated problem in recent weeks Close cardiology follow up and ongoing care  Hyperlipidemia LDL goal <100 Hyperlipidemia:Low fat diet discussed and encouraged.  [facility] available at this time, will attempt to obtain labs from the [facility] [place] to determine  control   IDDM (insulin dependent diabetes mellitus) Patient educated about the importance of limiting  Carbohydrate intake , the need to commit to daily physical activity for a minimum of 30 minutes , and to commit weight loss. The fact that changes in all these areas will reduce or eliminate all together the development of diabetes is stressed.   Uncertain of control as [facility]  GERD (gastroesophageal reflux disease) Controlled, no change in medication   ESRD Ongoing  Dialysis 3 times weekly

## 2014-10-21 NOTE — Assessment & Plan Note (Signed)
Hyperlipidemia:Low fat diet discussed and encouraged.  [facility] and no current labs available at this time, will attempt to obtain labs from the [place] to determine  control

## 2014-10-21 NOTE — Assessment & Plan Note (Signed)
Patient educated about the importance of limiting  Carbohydrate intake , the need to commit to daily physical activity for a minimum of 30 minutes , and to commit weight loss. The fact that changes in all these areas will reduce or eliminate all together the development of diabetes is stressed.   Uncertain of control as [facility]

## 2014-10-21 NOTE — Assessment & Plan Note (Signed)
Ongoing  Dialysis 3 times weekly

## 2014-10-21 NOTE — Assessment & Plan Note (Signed)
Controlled, no change in medication  

## 2014-10-21 NOTE — Assessment & Plan Note (Signed)
Three weeks of cleocin prescribed. Sinus films if remains symptomatic

## 2014-10-31 ENCOUNTER — Ambulatory Visit (INDEPENDENT_AMBULATORY_CARE_PROVIDER_SITE_OTHER): Payer: Medicare Other | Admitting: Family Medicine

## 2014-10-31 ENCOUNTER — Encounter: Payer: Self-pay | Admitting: Family Medicine

## 2014-10-31 VITALS — BP 142/70 | HR 82 | Resp 16 | Ht 74.0 in | Wt 256.1 lb

## 2014-10-31 DIAGNOSIS — N186 End stage renal disease: Secondary | ICD-10-CM

## 2014-10-31 DIAGNOSIS — E119 Type 2 diabetes mellitus without complications: Secondary | ICD-10-CM

## 2014-10-31 DIAGNOSIS — Z794 Long term (current) use of insulin: Secondary | ICD-10-CM

## 2014-10-31 DIAGNOSIS — I10 Essential (primary) hypertension: Secondary | ICD-10-CM

## 2014-10-31 DIAGNOSIS — E1122 Type 2 diabetes mellitus with diabetic chronic kidney disease: Secondary | ICD-10-CM

## 2014-10-31 DIAGNOSIS — IMO0001 Reserved for inherently not codable concepts without codable children: Secondary | ICD-10-CM

## 2014-10-31 DIAGNOSIS — E785 Hyperlipidemia, unspecified: Secondary | ICD-10-CM

## 2014-10-31 DIAGNOSIS — E669 Obesity, unspecified: Secondary | ICD-10-CM

## 2014-10-31 NOTE — Progress Notes (Signed)
   Subjective:    Patient ID: Albert Lewis, male    DOB: 07/22/1951, 63 y.o.   MRN: [id number]  HPI The PT is here for follow up and re-evaluation of chronic medical conditions, medication management and review of any available recent lab and radiology data.  Preventive health is updated, specifically  Cancer screening and Immunization.   Questions or concerns regarding consultations or procedures which the PT has had in the interim are  addressed. The PT denies any adverse reactions to current medications since the last visit.  There are no new concerns.  There are no specific complaints       Review of Systems See HPI Denies recent fever or chills. Denies sinus pressure, nasal congestion, ear pain or sore throat. Denies chest congestion, productive cough or wheezing. Denies chest pains, palpitations and leg swelling Denies abdominal pain, nausea, vomiting,diarrhea or constipation.   Denies dysuria, frequency, hesitancy or incontinence. Denies joint pain, swelling and limitation in mobility. Denies headaches, seizures, numbness, or tingling. Denies depression, anxiety or insomnia. Denies skin break down or rash.        Objective:   Physical Exam BP 142/70 mmHg  Pulse 82  Resp 16  Ht 6\' 2"  (1.88 m)  Wt 256 lb 1.9 oz (116.175 kg)  BMI 32.87 kg/m2  SpO2 92% Patient alert and oriented and in no cardiopulmonary distress.  HEENT: No facial asymmetry, EOMI,   oropharynx pink and moist.  Neck supple no JVD, no mass.  Chest: Clear to auscultation bilaterally.  CVS: S1, S2 no murmurs, no S3.Regular rate.  ABD: Soft non tender.   Ext: No edema  MS: Adequate ROM spine, shoulders, hips and knees.  Skin: Intact, no ulcerations or rash noted.  Psych: Good eye contact, normal affect. Memory intact not anxious or depressed appearing.  CNS: CN 2-12 intact, power,  normal throughout.no focal deficits noted.        Assessment & Plan:  Essential  hypertension Controlled, no change in medication DASH diet and commitment to daily physical activity for a minimum of 30 minutes discussed and encouraged, as a part of hypertension management. The importance of attaining a healthy weight is also discussed.    DM type 2 causing ESRD Patient educated about the importance of limiting  Carbohydrate intake , the need to commit to daily physical activity for a minimum of 30 minutes , and to commit weight loss. The fact that changes in all these areas will reduce or eliminate all together the development of diabetes is stressed.   Updated lab needed    Hyperlipidemia LDL goal <100 Hyperlipidemia:Low fat diet discussed and encouraged.  Updated lab needed at/ before next visit.    Obesity (BMI 30-39.9) Unchanged Patient re-educated about  the importance of commitment to a  minimum of 150 minutes of exercise per week. The importance of healthy food choices with portion control discussed. Encouraged to start a food diary, count calories and to consider  joining a support group. Sample diet sheets offered. Goals set by the patient for the next several months.

## 2014-10-31 NOTE — Patient Instructions (Signed)
Annual wellness in 4 month, call if you need me before  Script for diabetic shoes today , also you are r referred to podiatrist [facility] [place]  Please sign for recent labs from the [place], needs hBA1C and lipids   You will be referred for circulation test of lower extremities   Pls schedule ey  Exam and get shyingles vaccine

## 2014-11-07 ENCOUNTER — Other Ambulatory Visit: Payer: Self-pay

## 2014-11-07 DIAGNOSIS — R29898 Other symptoms and signs involving the musculoskeletal system: Secondary | ICD-10-CM

## 2014-11-08 ENCOUNTER — Ambulatory Visit (HOSPITAL_COMMUNITY)
Admission: RE | Admit: 2014-11-08 | Discharge: 2014-11-08 | Disposition: A | Discharge status: Home / Self Care | Payer: Medicare Other | Source: Ambulatory Visit | Attending: Family Medicine | Admitting: Family Medicine

## 2014-11-08 DIAGNOSIS — E119 Type 2 diabetes mellitus without complications: Secondary | ICD-10-CM | POA: Insufficient documentation

## 2014-11-08 DIAGNOSIS — I10 Essential (primary) hypertension: Secondary | ICD-10-CM | POA: Insufficient documentation

## 2014-11-08 DIAGNOSIS — M79673 Pain in unspecified foot: Secondary | ICD-10-CM | POA: Diagnosis present

## 2014-11-08 DIAGNOSIS — Z87891 Personal history of nicotine dependence: Secondary | ICD-10-CM | POA: Insufficient documentation

## 2014-11-08 DIAGNOSIS — E785 Hyperlipidemia, unspecified: Secondary | ICD-10-CM | POA: Diagnosis not present

## 2014-11-08 DIAGNOSIS — I739 Peripheral vascular disease, unspecified: Secondary | ICD-10-CM | POA: Diagnosis present

## 2014-11-08 DIAGNOSIS — R29898 Other symptoms and signs involving the musculoskeletal system: Secondary | ICD-10-CM

## 2014-11-28 ENCOUNTER — Encounter (HOSPITAL_COMMUNITY): Payer: Self-pay | Admitting: Internal Medicine

## 2014-11-29 ENCOUNTER — Telehealth: Payer: Self-pay

## 2014-11-29 DIAGNOSIS — I739 Peripheral vascular disease, unspecified: Secondary | ICD-10-CM

## 2014-11-29 DIAGNOSIS — M7989 Other specified soft tissue disorders: Secondary | ICD-10-CM

## 2014-11-29 NOTE — Telephone Encounter (Signed)
Referral is entered for [facility] to eval him furhter please let him know :Let him know nif he has open sores , needs to be seen at wound center for this, in [place] or [facility] [place] [facility], if he choses local option pls refer, I will sign

## 2014-11-29 NOTE — Telephone Encounter (Signed)
Patient aware- no open sores now, just a dried up scab. No wound center referral entered

## 2014-11-29 NOTE — Telephone Encounter (Signed)
He is aware that his circulation in his legs are normal but he is still having problems with tingling and swelling in both legs and has been having sores coming up on there back of his legs. Said you were supposed to be sending him somewhere for this at the last visit

## 2014-11-30 ENCOUNTER — Encounter (HOSPITAL_COMMUNITY): Payer: Self-pay | Admitting: Emergency Medicine

## 2014-11-30 ENCOUNTER — Emergency Department (HOSPITAL_COMMUNITY): Payer: Medicare Other

## 2014-11-30 ENCOUNTER — Emergency Department (HOSPITAL_COMMUNITY)
Admission: EM | Admit: 2014-11-30 | Discharge: 2014-11-30 | Disposition: A | Discharge status: Home / Self Care | Payer: Medicare Other | Attending: Emergency Medicine | Admitting: Emergency Medicine

## 2014-11-30 DIAGNOSIS — I4892 Unspecified atrial flutter: Secondary | ICD-10-CM | POA: Insufficient documentation

## 2014-11-30 DIAGNOSIS — Z880 Allergy status to penicillin: Secondary | ICD-10-CM | POA: Diagnosis not present

## 2014-11-30 DIAGNOSIS — S8992XA Unspecified injury of left lower leg, initial encounter: Secondary | ICD-10-CM | POA: Insufficient documentation

## 2014-11-30 DIAGNOSIS — E119 Type 2 diabetes mellitus without complications: Secondary | ICD-10-CM | POA: Diagnosis not present

## 2014-11-30 DIAGNOSIS — K219 Gastro-esophageal reflux disease without esophagitis: Secondary | ICD-10-CM | POA: Insufficient documentation

## 2014-11-30 DIAGNOSIS — Y9289 Other specified places as the place of occurrence of the external cause: Secondary | ICD-10-CM | POA: Diagnosis not present

## 2014-11-30 DIAGNOSIS — M25562 Pain in left knee: Secondary | ICD-10-CM

## 2014-11-30 DIAGNOSIS — Z79899 Other long term (current) drug therapy: Secondary | ICD-10-CM | POA: Diagnosis not present

## 2014-11-30 DIAGNOSIS — E785 Hyperlipidemia, unspecified: Secondary | ICD-10-CM | POA: Insufficient documentation

## 2014-11-30 DIAGNOSIS — Z992 Dependence on renal dialysis: Secondary | ICD-10-CM | POA: Diagnosis not present

## 2014-11-30 DIAGNOSIS — E669 Obesity, unspecified: Secondary | ICD-10-CM | POA: Diagnosis not present

## 2014-11-30 DIAGNOSIS — Z87891 Personal history of nicotine dependence: Secondary | ICD-10-CM | POA: Diagnosis not present

## 2014-11-30 DIAGNOSIS — Y9389 Activity, other specified: Secondary | ICD-10-CM | POA: Diagnosis not present

## 2014-11-30 DIAGNOSIS — Z9841 Cataract extraction status, right eye: Secondary | ICD-10-CM | POA: Diagnosis not present

## 2014-11-30 DIAGNOSIS — N186 End stage renal disease: Secondary | ICD-10-CM | POA: Diagnosis not present

## 2014-11-30 DIAGNOSIS — D649 Anemia, unspecified: Secondary | ICD-10-CM | POA: Diagnosis not present

## 2014-11-30 DIAGNOSIS — Y998 Other external cause status: Secondary | ICD-10-CM | POA: Insufficient documentation

## 2014-11-30 DIAGNOSIS — G4730 Sleep apnea, unspecified: Secondary | ICD-10-CM | POA: Insufficient documentation

## 2014-11-30 DIAGNOSIS — Z9842 Cataract extraction status, left eye: Secondary | ICD-10-CM | POA: Diagnosis not present

## 2014-11-30 DIAGNOSIS — W19XXXA Unspecified fall, initial encounter: Secondary | ICD-10-CM

## 2014-11-30 DIAGNOSIS — I120 Hypertensive chronic kidney disease with stage 5 chronic kidney disease or end stage renal disease: Secondary | ICD-10-CM | POA: Diagnosis not present

## 2014-11-30 DIAGNOSIS — G8929 Other chronic pain: Secondary | ICD-10-CM | POA: Diagnosis not present

## 2014-11-30 DIAGNOSIS — W1839XA Other fall on same level, initial encounter: Secondary | ICD-10-CM | POA: Diagnosis not present

## 2014-11-30 DIAGNOSIS — I480 Paroxysmal atrial fibrillation: Secondary | ICD-10-CM | POA: Insufficient documentation

## 2014-11-30 DIAGNOSIS — Z9981 Dependence on supplemental oxygen: Secondary | ICD-10-CM | POA: Diagnosis not present

## 2014-11-30 DIAGNOSIS — Z794 Long term (current) use of insulin: Secondary | ICD-10-CM | POA: Diagnosis not present

## 2014-11-30 MED ORDER — HYDROCODONE-ACETAMINOPHEN 5-325 MG PO TABS: 1.0000 | ORAL_TABLET | ORAL | Status: DC | PRN

## 2014-11-30 MED ORDER — BACITRACIN-NEOMYCIN-POLYMYXIN 400-5-5000 EX OINT
TOPICAL_OINTMENT | Freq: Once | CUTANEOUS | Status: AC
  Administered 2014-11-30: 1 via TOPICAL
  Filled 2014-11-30: qty 1

## 2014-11-30 NOTE — ED Notes (Signed)
Patient with no complaints at this time. Respirations even and unlabored. Skin warm/dry. Discharge instructions reviewed with patient at this time. Patient given opportunity to voice concerns/ask questions. Patient discharged at this time and left Emergency Department with steady gait.   

## 2014-11-30 NOTE — ED Notes (Signed)
Pt states he fell last night onto concrete injuring his left knee. Pt still able to ambulate but states he has a lot of pain with ambulation

## 2014-11-30 NOTE — ED Provider Notes (Signed)
CSN: [id number]     Arrival date & time 11/30/14  0941 History   First MD Initiated Contact with Patient 11/30/14 1055     Chief Complaint  Patient presents with  . Knee Pain     (Consider location/radiation/quality/duration/timing/severity/associated sxs/prior Treatment) Patient is a 63 y.o. male presenting with knee pain. The history is provided by the patient.  Knee Pain Location:  Knee Time since incident:  1 day Injury: yes   Mechanism of injury: fall   Fall:    Fall occurred:  Standing   Impact surface:  Product manager of impact:  Knees   Entrapped after fall: no   Knee location:  L knee Pain details:    Quality:  Shooting   Radiates to:  Does not radiate   Severity:  Moderate   Onset quality:  Sudden   Timing:  Constant   Progression:  Worsening Chronicity:  New Dislocation: no   Foreign body present:  No foreign bodies Prior injury to area:  No Relieved by:  Nothing Worsened by:  Activity and bearing weight  Albert Lewis is a 63 y.o. male with hx of multiple medical problems as listed below including dialysis. He presents to the ED with left knee pain s/p injury last pm. He states he fell on the concrete and this morning the pain is worse. He denies any other injuries.   Past Medical History  Diagnosis Date  . Paroxysmal atrial fibrillation 2/09    Abnormal stress nuclear study in 01/2008; normal coronary angiography  . Hyperlipidemia 2008  . Hypertension 1993    Mild to moderate LVH  . Obesity     sleep apnea treated with BiPAP  . Anemia   . Gastroesophageal reflux disease   . Hyperparathyroidism     Secondary  . Allergic rhinitis   . Lower gastrointestinal bleed 04/2008    diverticulosis; hemorrhoids; constipation  . Diverticular disease   . Chronic back pain     Degenerative disc disease  . ESRD (end stage renal disease) on dialysis     Dialysis since 2007  . Renal insufficiency   . Sleep apnea 2010    CPAP followed at [facility]  . Cataract    s/p extraction x 2  . Diabetes mellitus, type 2 1993    no insulin  . Atrial flutter    Past Surgical History  Procedure Laterality Date  . Cataract extraction w/ intraocular lens  implant, bilateral  2008  . Lumbar peritoneal shunt    . Exploratory laparotomy      shunt complications  . Back surgery    . Cholecystectomy  1999  . Spine surgery      twice in lower back  . Atrial flutter ablation  06/18/14, 08/22/14    CTI ablation x 2 by Dr Rayann Heman  . Atrial flutter ablation N/A 06/18/2014    Procedure: ATRIAL FLUTTER ABLATION;  Surgeon: Coralyn Mark, MD;  Location: [facility];  Service: Cardiovascular;  Laterality: N/A;  . Atrial flutter ablation N/A 08/22/2014    Procedure: ATRIAL FLUTTER ABLATION;  Surgeon: Coralyn Mark, MD;  Location: [facility];  Service: Cardiovascular;  Laterality: N/A;   Family History  Problem Relation Age of Onset  . Hypertension Mother   . Cancer Mother 11    breast   . Hypertension Sister 11  . Hypertension Daughter 67  . Diabetes Daughter    History  Substance Use Topics  . Smoking status: Former Smoker --  0.75 packs/day for 20 years    Types: Cigarettes    Start date: 03/20/1969    Quit date: 12/20/1988  . Smokeless tobacco: Never Used  . Alcohol Use: No    Review of Systems Negative except as stated in HPI   Allergies  Celebrex; Penicillins; and Actos  Home Medications   Prior to Admission medications   Medication Sig Start Date End Date Taking? Authorizing Provider  acetaminophen (TYLENOL) 325 MG tablet Take 650 mg by mouth every 6 (six) hours as needed for moderate pain.    Historical Provider, MD  albuterol (PROVENTIL HFA;VENTOLIN HFA) 108 (90 BASE) MCG/ACT inhaler Inhale 2 puffs into the lungs every 6 (six) hours as needed for wheezing. 10/19/12   Alycia Rossetti, MD  albuterol (PROVENTIL) (2.5 MG/3ML) 0.083% nebulizer solution Take 3 mLs (2.5 mg total) by nebulization every 6 (six) hours as needed for wheezing or shortness of  breath. 10/30/13   Fayrene Helper, MD  allopurinol (ZYLOPRIM) 100 MG tablet Take 100 mg by mouth daily.     Historical Provider, MD  B Complex-C-Folic Acid (RENAL MULTIVITAMIN FORMULA PO) Take 1 tablet by mouth daily.    Historical Provider, MD  cetirizine (ZYRTEC) 10 MG tablet Take 10 mg by mouth daily as needed for allergies.    Historical Provider, MD  cinacalcet (SENSIPAR) 90 MG tablet Take 90 mg by mouth daily.    Historical Provider, MD  docusate sodium (COLACE) 100 MG capsule Take 200 mg by mouth 2 (two) times daily as needed for moderate constipation.     Historical Provider, MD  fish oil-omega-3 fatty acids 1000 MG capsule Take 2 g by mouth 3 (three) times daily. SUPER OMEGA 3 FORMULA: EPA 300/DHA20    Historical Provider, MD  gabapentin (NEURONTIN) 100 MG capsule Take 1 capsule (100 mg total) by mouth 2 (two) times daily. 01/29/14   Fayrene Helper, MD  gemfibrozil (LOPID) 600 MG tablet Take 600 mg by mouth 2 (two) times daily.    Historical Provider, MD  insulin glargine (LANTUS) 100 UNIT/ML injection Inject 22 Units into the skin at bedtime.     Historical Provider, MD  ipratropium (ATROVENT) 0.02 % nebulizer solution Take 2.5 mLs (0.5 mg total) by nebulization 4 (four) times daily. 10/30/13   Fayrene Helper, MD  pantoprazole (PROTONIX) 40 MG tablet Take 40 mg by mouth daily.    Historical Provider, MD  PRESCRIPTION MEDICATION Place 1 drop into both eyes 3 (three) times daily. For dry eyes    Historical Provider, MD  sevelamer (RENVELA) 800 MG tablet Take 2,400-4,800 mg by mouth See admin instructions. Takes 6 tablets (4800 mg) three times daily with meals and 3 tablets (2400 mg) with snack.    Historical Provider, MD   BP 166/64 mmHg  Pulse 94  Temp(Src) 98.6 F (37 C) (Oral)  Resp 18  SpO2 93% Physical Exam  Constitutional: He is oriented to person, place, and time. He appears well-developed and well-nourished. No distress.  HENT:  Head: Normocephalic.  Eyes: EOM are  normal.  Neck: Neck supple.  Cardiovascular: Normal rate.   Pulmonary/Chest: Effort normal.  Musculoskeletal:       Left knee: He exhibits no deformity. Swelling: mild. Lacerations: aabrasion. Abnormal alignment: due to pain. Tenderness found. LCL and patellar tendon tenderness noted.  Pedal pulses equal, adequate circulation, good touch sensation. Abrasion to the anterior knee. Pain with range of motion and increases with flexion.   Neurological: He is alert and oriented to  person, place, and time. No cranial nerve deficit.  Skin: Skin is warm and dry.  Psychiatric: He has a normal mood and affect. His behavior is normal.  Nursing note and vitals reviewed.   ED Course  Procedures   Dg Knee Complete 4 Views Left  11/30/2014   CLINICAL DATA:  63 year old male with lateral knee pain after falling last night  EXAM: LEFT KNEE - COMPLETE 4+ VIEW  COMPARISON:  None  FINDINGS: No evidence of acute fracture or malalignment. No knee joint effusion. Serpentine linear sclerosis is present within the medullary cavity of the distal femoral metaphysis and proximal tibial metaphysis. This appearance is most consistent with regions of bone infarct. Otherwise, the mineralization is within normal limits. Tricompartmental degenerative osteoarthritis is present most significant in the medial compartment. Atherosclerotic vascular calcifications present in the superficial femoral and popliteal arteries.  IMPRESSION: 1. No evidence of acute fracture, malalignment or joint effusion. 2. 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 3. Atherosclerotic vascular calcifications. 4. 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   Electronically Signed   By: Jacqulynn Cadet M.D.   On: 11/30/2014 11:50    MDM  63 y.o. male with left knee pain s/p injury last pm. Placed in knee immobilizer, ice, elevation and follow up with ortho if symptoms persist. Discussed  with the patient clinical and x-ray findings and plan of care. All questioned fully answered. He will return if any problems arise. Stable for discharge without neurovascular deficits.    Medication List    TAKE these medications        HYDROcodone-acetaminophen 5-325 MG per tablet  Commonly known as:  NORCO/VICODIN  Take 1 tablet by mouth every 4 (four) hours as needed.      ASK your doctor about these medications        acetaminophen 325 MG tablet  Commonly known as:  TYLENOL  Take 650 mg by mouth every 6 (six) hours as needed for moderate pain.     albuterol 108 (90 BASE) MCG/ACT inhaler  Commonly known as:  PROVENTIL HFA;VENTOLIN HFA  Inhale 2 puffs into the lungs every 6 (six) hours as needed for wheezing.     albuterol (2.5 MG/3ML) 0.083% nebulizer solution  Commonly known as:  PROVENTIL  Take 3 mLs (2.5 mg total) by nebulization every 6 (six) hours as needed for wheezing or shortness of breath.     allopurinol 100 MG tablet  Commonly known as:  ZYLOPRIM  Take 100 mg by mouth daily.     cetirizine 10 MG tablet  Commonly known as:  ZYRTEC  Take 10 mg by mouth daily as needed for allergies.     cinacalcet 90 MG tablet  Commonly known as:  SENSIPAR  Take 90 mg by mouth daily.     docusate sodium 100 MG capsule  Commonly known as:  COLACE  Take 200 mg by mouth 2 (two) times daily as needed for moderate constipation.     fish oil-omega-3 fatty acids 1000 MG capsule  Take 2 g by mouth 3 (three) times daily. SUPER OMEGA 3 FORMULA: EPA 300/DHA20     gabapentin 100 MG capsule  Commonly known as:  NEURONTIN  Take 1 capsule (100 mg total) by mouth 2 (two) times daily.     gemfibrozil 600 MG tablet  Commonly known as:  LOPID  Take 600 mg by mouth 2 (two) times daily.     insulin glargine 100 UNIT/ML injection  Commonly  known as:  LANTUS  Inject 22 Units into the skin at bedtime.     ipratropium 0.02 % nebulizer solution  Commonly known as:  ATROVENT  Take 2.5 mLs  (0.5 mg total) by nebulization 4 (four) times daily.     pantoprazole 40 MG tablet  Commonly known as:  PROTONIX  Take 40 mg by mouth daily.     PRESCRIPTION MEDICATION  Place 1 drop into both eyes 3 (three) times daily. For dry eyes     RENAL MULTIVITAMIN FORMULA PO  Take 1 tablet by mouth daily.     RENVELA 800 MG tablet  Generic drug:  sevelamer carbonate  Take 2,400-4,800 mg by mouth See admin instructions. Takes 6 tablets (4800 mg) three times daily with meals and 3 tablets (2400 mg) with snack.        Final diagnoses:  Knee pain, acute, left      [facility], NP 11/30/14 [place] Alvino Chapel, MD 11/30/14 [id number]

## 2014-12-01 ENCOUNTER — Telehealth: Payer: Self-pay | Admitting: Family Medicine

## 2014-12-01 DIAGNOSIS — M25562 Pain in left knee: Secondary | ICD-10-CM

## 2014-12-01 NOTE — Telephone Encounter (Signed)
Pls call pt and make him aware that I know he was in [facility] due to knee pain and that he fell. Needs to see ortho of his choice . Pls refer to ortho of his choice , if no prefernce  staed , start with Dr Aline Brochure please, I will sign

## 2014-12-02 ENCOUNTER — Telehealth: Payer: Self-pay

## 2014-12-02 NOTE — Telephone Encounter (Signed)
Patient referred to ortho

## 2014-12-02 NOTE — Telephone Encounter (Signed)
Pt aware and referral entered 

## 2014-12-02 NOTE — Addendum Note (Signed)
Addended by: Eual Fines on: 12/02/2014 11:33 AM   Modules accepted: Orders

## 2015-01-10 ENCOUNTER — Encounter: Payer: Medicare Other | Admitting: Vascular Surgery

## 2015-01-10 ENCOUNTER — Encounter (HOSPITAL_COMMUNITY): Payer: Medicare Other

## 2015-01-14 NOTE — Assessment & Plan Note (Addendum)
Patient educated about the importance of limiting  Carbohydrate intake , the need to commit to daily physical activity for a minimum of 30 minutes , and to commit weight loss. The fact that changes in all these areas will reduce or eliminate all together the development of diabetes is stressed.   Updated lab needed 

## 2015-01-14 NOTE — Assessment & Plan Note (Signed)
Hyperlipidemia:Low fat diet discussed and encouraged.  Updated lab needed at/ before next visit.  

## 2015-01-14 NOTE — Assessment & Plan Note (Signed)
Controlled, no change in medication DASH diet and commitment to daily physical activity for a minimum of 30 minutes discussed and encouraged, as a part of hypertension management. The importance of attaining a healthy weight is also discussed.  

## 2015-01-14 NOTE — Assessment & Plan Note (Signed)
Unchanged. Patient re-educated about  the importance of commitment to a  minimum of 150 minutes of exercise per week. The importance of healthy food choices with portion control discussed. Encouraged to start a food diary, count calories and to consider  joining a support group. Sample diet sheets offered. Goals set by the patient for the next several months.    

## 2015-01-29 ENCOUNTER — Other Ambulatory Visit: Payer: Self-pay | Admitting: *Deleted

## 2015-01-29 DIAGNOSIS — M7989 Other specified soft tissue disorders: Secondary | ICD-10-CM

## 2015-01-30 ENCOUNTER — Encounter: Payer: Medicare Other | Admitting: Vascular Surgery

## 2015-01-30 ENCOUNTER — Encounter (HOSPITAL_COMMUNITY): Payer: Medicare Other

## 2015-02-04 ENCOUNTER — Encounter: Payer: Self-pay | Admitting: Vascular Surgery

## 2015-02-05 ENCOUNTER — Ambulatory Visit (HOSPITAL_COMMUNITY)
Admission: RE | Admit: 2015-02-05 | Discharge: 2015-02-05 | Disposition: A | Discharge status: Home / Self Care | Payer: Medicare Other | Source: Ambulatory Visit | Attending: Vascular Surgery | Admitting: Vascular Surgery

## 2015-02-05 ENCOUNTER — Ambulatory Visit (INDEPENDENT_AMBULATORY_CARE_PROVIDER_SITE_OTHER): Payer: Medicare Other | Admitting: Vascular Surgery

## 2015-02-05 ENCOUNTER — Encounter: Payer: Self-pay | Admitting: Vascular Surgery

## 2015-02-05 VITALS — BP 157/73 | HR 102 | Resp 18 | Ht 74.0 in | Wt 253.0 lb

## 2015-02-05 DIAGNOSIS — M7989 Other specified soft tissue disorders: Secondary | ICD-10-CM | POA: Insufficient documentation

## 2015-02-05 DIAGNOSIS — I872 Venous insufficiency (chronic) (peripheral): Secondary | ICD-10-CM

## 2015-02-05 NOTE — Progress Notes (Signed)
[facility] HISTORY AND PHYSICAL   History of Present Illness:  Patient is a 64 y.o. year old male who presents for evaluation of lower extremity swelling with ulcers. The patient states his legs swell up intermittently. He has also had some skin breakdown near the Achilles tendon in both legs intermittently. They are currently healed. He states the symptoms have been going on for approximately one year. He also states his feet always feel cold. He sometimes becomes unstable on his feet when first standing up in the morning. He does not really describe claudication symptoms. He does however describe a burning sensation that occurs in his calf at night time. He denies prior history of DVT. He denies any significant varicose veins. He has never really had difficulty healing up wounds.  Other medical problems include end-stage renal disease on dialysis Monday Wednesday and Friday. He has been on dialysis for 9 years. Also has a history of hypertension. He has had diabetes for greater than 20 years. Other medical problems include obesity with sleep apnea on BiPAP, hyperlipidemia, paroxysmal atrial fibrillation, chronic back pain all of which are currently stable.  Past Medical History  Diagnosis Date  . Paroxysmal atrial fibrillation 2/09    Abnormal stress nuclear study in 01/2008; normal coronary angiography  . Hyperlipidemia 2008  . Hypertension 1993    Mild to moderate LVH  . Obesity     sleep apnea treated with BiPAP  . Anemia   . Gastroesophageal reflux disease   . Hyperparathyroidism     Secondary  . Allergic rhinitis   . Lower gastrointestinal bleed 04/2008    diverticulosis; hemorrhoids; constipation  . Diverticular disease   . Chronic back pain     Degenerative disc disease  . ESRD (end stage renal disease) on dialysis     Dialysis since 2007  . Renal insufficiency   . Sleep apnea 2010    CPAP followed at [place]  . Cataract     s/p extraction x 2  .  Diabetes mellitus, type 2 1993    no insulin  . Atrial flutter     Past Surgical History  Procedure Laterality Date  . Cataract extraction w/ intraocular lens  implant, bilateral  2008  . Lumbar peritoneal shunt    . Exploratory laparotomy      shunt complications  . Back surgery    . Cholecystectomy  1999  . Spine surgery      twice in lower back  . Atrial flutter ablation  06/18/14, 08/22/14    CTI ablation x 2 by Dr Rayann Heman  . Atrial flutter ablation N/A 06/18/2014    Procedure: ATRIAL FLUTTER ABLATION;  Surgeon: Coralyn Mark, MD;  Location: [facility];  Service: Cardiovascular;  Laterality: N/A;  . Atrial flutter ablation N/A 08/22/2014    Procedure: ATRIAL FLUTTER ABLATION;  Surgeon: Coralyn Mark, MD;  Location: [facility];  Service: Cardiovascular;  Laterality: N/A;    Social History History  Substance Use Topics  . Smoking status: Former Smoker -- 0.75 packs/day for 20 years    Types: Cigarettes    Start date: 03/20/1969    Quit date: 12/20/1988  . Smokeless tobacco: Never Used  . Alcohol Use: No    Family History Family History  Problem Relation Age of Onset  . Hypertension Mother   . Cancer Mother 74    breast   . Heart disease Mother   . Hyperlipidemia Mother   . Diabetes  Mother   . Hypertension Sister 7  . Heart disease Sister   . Hyperlipidemia Sister   . Diabetes Sister   . Hypertension Daughter 59  . Diabetes Daughter   . Heart disease Daughter   . Heart disease Father   . Hyperlipidemia Father   . Hypertension Father   . Diabetes Father   . Diabetes Brother     Allergies  Allergies  Allergen Reactions  . Celebrex [Celecoxib] Other (See Comments)    Heart sped up  . Penicillins Other (See Comments)    Couldn't walk.   . Actos [Pioglitazone] Other (See Comments)    unknown     Current Outpatient Prescriptions  Medication Sig Dispense Refill  . acetaminophen (TYLENOL) 325 MG tablet Take 650 mg by mouth every 6 (six) hours as needed  for moderate pain.    Marland Kitchen albuterol (PROVENTIL HFA;VENTOLIN HFA) 108 (90 BASE) MCG/ACT inhaler Inhale 2 puffs into the lungs every 6 (six) hours as needed for wheezing. 1 Inhaler 2  . albuterol (PROVENTIL) (2.5 MG/3ML) 0.083% nebulizer solution Take 3 mLs (2.5 mg total) by nebulization every 6 (six) hours as needed for wheezing or shortness of breath. 75 mL 0  . allopurinol (ZYLOPRIM) 100 MG tablet Take 100 mg by mouth daily.     . B Complex-C-Folic Acid (RENAL MULTIVITAMIN FORMULA PO) Take 1 tablet by mouth daily.    . cetirizine (ZYRTEC) 10 MG tablet Take 10 mg by mouth daily as needed for allergies.    . cinacalcet (SENSIPAR) 90 MG tablet Take 90 mg by mouth daily.    Marland Kitchen docusate sodium (COLACE) 100 MG capsule Take 200 mg by mouth 2 (two) times daily as needed for moderate constipation.     . fish oil-omega-3 fatty acids 1000 MG capsule Take 2 g by mouth 3 (three) times daily. SUPER OMEGA 3 FORMULA: EPA 300/DHA20    . gabapentin (NEURONTIN) 100 MG capsule Take 1 capsule (100 mg total) by mouth 2 (two) times daily. 60 capsule 3  . gemfibrozil (LOPID) 600 MG tablet Take 600 mg by mouth 2 (two) times daily.    Marland Kitchen HYDROcodone-acetaminophen (NORCO/VICODIN) 5-325 MG per tablet Take 1 tablet by mouth every 4 (four) hours as needed. 15 tablet 0  . insulin glargine (LANTUS) 100 UNIT/ML injection Inject 22 Units into the skin at bedtime.     Marland Kitchen ipratropium (ATROVENT) 0.02 % nebulizer solution Take 2.5 mLs (0.5 mg total) by nebulization 4 (four) times daily. 75 mL 0  . pantoprazole (PROTONIX) 40 MG tablet Take 40 mg by mouth daily.    Marland Kitchen PRESCRIPTION MEDICATION Place 1 drop into both eyes 3 (three) times daily. For dry eyes    . sevelamer (RENVELA) 800 MG tablet Take 2,400-4,800 mg by mouth See admin instructions. Takes 6 tablets (4800 mg) three times daily with meals and 3 tablets (2400 mg) with snack.     No current facility-administered medications for this visit.    ROS:   General:  No weight loss,  Fever, chills  HEENT: No recent headaches, no nasal bleeding, no visual changes, no sore throat  Neurologic: No dizziness, blackouts, seizures. No recent symptoms of stroke or mini- stroke. No recent episodes of slurred speech, or temporary blindness.  Cardiac: No recent episodes of chest pain/pressure, no shortness of breath at rest.  No shortness of breath with exertion.  Denies history of atrial fibrillation or irregular heartbeat  Vascular: No history of rest pain in feet.  No history of claudication.  No history of non-healing ulcer, No history of DVT   Pulmonary: No home oxygen, no productive cough, no hemoptysis,  No asthma or wheezing  Musculoskeletal:  [ ]  Arthritis, [ ]  Low back pain,  [ ]  Joint pain  Hematologic:No history of hypercoagulable state.  No history of easy bleeding.  No history of anemia  Gastrointestinal: No hematochezia or melena,  No gastroesophageal reflux, no trouble swallowing  Urinary: [ ]  chronic Kidney disease, [ ]  on HD - [ ]  MWF or [ ]  TTHS, [ ]  Burning with urination, [ ]  Frequent urination, [ ]  Difficulty urinating;   Skin: No rashes  Psychological: No history of anxiety,  No history of depression   Physical Examination  Filed Vitals:   02/05/15 1213  BP: 157/73  Pulse: 102  Resp: 18  Height: 6\' 2"  (1.88 m)  Weight: 253 lb (114.76 kg)    Body mass index is 32.47 kg/(m^2).  General:  Alert and oriented, no acute distress HEENT: Normal Neck: No bruit or JVD Pulmonary: Clear to auscultation bilaterally Cardiac: Regular Rate and Rhythm without murmur Abdomen: Soft, non-tender, non-distended, obese Skin: No rash, no open ulcerations several areas of 3 mm of recently healed wounds on the posterior aspect of both legs, no visible varicosities Extremity Pulses:  2+ radial, brachial, femoral, dorsalis pedis pulses bilaterally Musculoskeletal: No deformity or edema  Neurologic: Upper and lower extremity motor 5/5 and symmetric  DATA:  Patient  had bilateral venous reflux exam today. No evidence of DVT bilaterally. He did have mild reflux at the saphenofemoral junction on the right side. He had mild deep vein reflux on the left side.  I reviewed and interpreted this study.  The patient also had bilateral ABIs performed dated 11/08/2014. This showed an ABI on the right of 1.27 left 1.26  ASSESSMENT: Bilateral leg swelling. Most likely secondary to mild deep and superficial vein reflux. Some symptoms also suggestive of neuropathy.   PLAN:  Patient's vein evaluation does not show significant enough disease to consider laser ablation at this point. His symptoms are most likely multifactorial with venous reflux and neuropathy combining for his symptoms. He probably also has some component of autonomic neuropathy that is affecting his balance when he first stands. I believe he would benefit from bilateral lower extremity compression stockings. A prescription was given for these today. The patient will follow-up on an as-needed basis. The patient had a normal arterial exam today. He was reassured that he is not currently at risk of digit or limb loss.  Ruta Hinds, MD [facility] Office: [phone] Pager: [phone]

## 2015-02-27 ENCOUNTER — Telehealth: Payer: Self-pay | Admitting: *Deleted

## 2015-02-27 NOTE — Telephone Encounter (Signed)
Application for disability parking placard completed.  Patient notified to pick up via voice mail.  Original placed in tray up front.  Copy made for EPIC scanning.

## 2015-03-04 ENCOUNTER — Encounter: Payer: Self-pay | Admitting: Family Medicine

## 2015-03-04 ENCOUNTER — Encounter: Payer: Medicare Other | Admitting: Family Medicine

## 2015-03-13 ENCOUNTER — Emergency Department (HOSPITAL_COMMUNITY)
Admission: EM | Admit: 2015-03-13 | Discharge: 2015-03-13 | Disposition: A | Discharge status: Home / Self Care | Payer: Medicare Other | Attending: Emergency Medicine | Admitting: Emergency Medicine

## 2015-03-13 ENCOUNTER — Encounter (HOSPITAL_COMMUNITY): Payer: Self-pay | Admitting: Emergency Medicine

## 2015-03-13 ENCOUNTER — Emergency Department (HOSPITAL_COMMUNITY): Payer: Medicare Other

## 2015-03-13 DIAGNOSIS — K5900 Constipation, unspecified: Secondary | ICD-10-CM | POA: Insufficient documentation

## 2015-03-13 DIAGNOSIS — G8929 Other chronic pain: Secondary | ICD-10-CM | POA: Insufficient documentation

## 2015-03-13 DIAGNOSIS — I480 Paroxysmal atrial fibrillation: Secondary | ICD-10-CM | POA: Insufficient documentation

## 2015-03-13 DIAGNOSIS — E669 Obesity, unspecified: Secondary | ICD-10-CM | POA: Diagnosis not present

## 2015-03-13 DIAGNOSIS — Z87891 Personal history of nicotine dependence: Secondary | ICD-10-CM | POA: Insufficient documentation

## 2015-03-13 DIAGNOSIS — E119 Type 2 diabetes mellitus without complications: Secondary | ICD-10-CM | POA: Diagnosis not present

## 2015-03-13 DIAGNOSIS — G4730 Sleep apnea, unspecified: Secondary | ICD-10-CM | POA: Diagnosis not present

## 2015-03-13 DIAGNOSIS — K219 Gastro-esophageal reflux disease without esophagitis: Secondary | ICD-10-CM | POA: Insufficient documentation

## 2015-03-13 DIAGNOSIS — Z794 Long term (current) use of insulin: Secondary | ICD-10-CM | POA: Insufficient documentation

## 2015-03-13 DIAGNOSIS — Z9049 Acquired absence of other specified parts of digestive tract: Secondary | ICD-10-CM | POA: Insufficient documentation

## 2015-03-13 DIAGNOSIS — Z7952 Long term (current) use of systemic steroids: Secondary | ICD-10-CM | POA: Diagnosis not present

## 2015-03-13 DIAGNOSIS — Z79899 Other long term (current) drug therapy: Secondary | ICD-10-CM | POA: Insufficient documentation

## 2015-03-13 DIAGNOSIS — E213 Hyperparathyroidism, unspecified: Secondary | ICD-10-CM | POA: Insufficient documentation

## 2015-03-13 DIAGNOSIS — N186 End stage renal disease: Secondary | ICD-10-CM | POA: Insufficient documentation

## 2015-03-13 DIAGNOSIS — I120 Hypertensive chronic kidney disease with stage 5 chronic kidney disease or end stage renal disease: Secondary | ICD-10-CM | POA: Diagnosis not present

## 2015-03-13 DIAGNOSIS — Z880 Allergy status to penicillin: Secondary | ICD-10-CM | POA: Insufficient documentation

## 2015-03-13 DIAGNOSIS — Z862 Personal history of diseases of the blood and blood-forming organs and certain disorders involving the immune mechanism: Secondary | ICD-10-CM | POA: Diagnosis not present

## 2015-03-13 DIAGNOSIS — Z9889 Other specified postprocedural states: Secondary | ICD-10-CM | POA: Diagnosis not present

## 2015-03-13 DIAGNOSIS — K648 Other hemorrhoids: Secondary | ICD-10-CM | POA: Diagnosis not present

## 2015-03-13 DIAGNOSIS — Z992 Dependence on renal dialysis: Secondary | ICD-10-CM | POA: Insufficient documentation

## 2015-03-13 DIAGNOSIS — K6289 Other specified diseases of anus and rectum: Secondary | ICD-10-CM | POA: Diagnosis present

## 2015-03-13 DIAGNOSIS — Z9861 Coronary angioplasty status: Secondary | ICD-10-CM | POA: Diagnosis not present

## 2015-03-13 HISTORY — DX: Dependence on renal dialysis: Z99.2

## 2015-03-13 MED ORDER — HYDROCORTISONE ACETATE 25 MG RE SUPP: 25.0000 mg | Freq: Two times a day (BID) | RECTAL | Status: DC

## 2015-03-13 MED ORDER — HYDROCODONE-ACETAMINOPHEN 5-325 MG PO TABS: 2.0000 | ORAL_TABLET | ORAL | Status: AC | PRN

## 2015-03-13 MED ORDER — MAGNESIUM CITRATE PO SOLN: 0.5000 | Freq: Once | ORAL | Status: DC

## 2015-03-13 NOTE — Discharge Instructions (Signed)
Constipation Constipation is when a person:  Poops (has a bowel movement) less than 3 times a week.  Has a hard time pooping.  Has poop that is dry, hard, or bigger than normal. HOME CARE   Eat foods with a lot of fiber in them. This includes fruits, vegetables, beans, and whole grains such as brown rice.  Avoid fatty foods and foods with a lot of sugar. This includes french fries, hamburgers, cookies, candy, and soda.  If you are not getting enough fiber from food, take products with added fiber in them (supplements).  Drink enough fluid to keep your pee (urine) clear or pale yellow.  Exercise on a regular basis, or as told by your doctor.  Go to the restroom when you feel like you need to poop. Do not hold it.  Only take medicine as told by your doctor. Do not take medicines that help you poop (laxatives) without talking to your doctor first. GET HELP RIGHT AWAY IF:   You have bright red blood in your poop (stool).  Your constipation lasts more than 4 days or gets worse.  You have belly (abdominal) or butt (rectal) pain.  You have thin poop (as thin as a pencil).  You lose weight, and it cannot be explained. MAKE SURE YOU:   Understand these instructions.  Will watch your condition.  Will get help right away if you are not doing well or get worse. Document Released: 05/24/2008 Document Revised: 12/11/2013 Document Reviewed: 09/17/2013 [facility] Patient Information 2015 [place], [place]. This information is not intended to replace advice given to you by your health care provider. Make sure you discuss any questions you have with your health care provider.  Hemorrhoids Hemorrhoids are swollen veins around the rectum or anus. There are two types of hemorrhoids:   Internal hemorrhoids. These occur in the veins just inside the rectum. They may poke through to the outside and become irritated and painful.  External hemorrhoids. These occur in the veins outside the anus and  can be felt as a painful swelling or hard lump near the anus. CAUSES  Pregnancy.   Obesity.   Constipation or diarrhea.   Straining to have a bowel movement.   Sitting for long periods on the toilet.  Heavy lifting or other activity that caused you to strain.  Anal intercourse. SYMPTOMS   Pain.   Anal itching or irritation.   Rectal bleeding.   Fecal leakage.   Anal swelling.   One or more lumps around the anus.  DIAGNOSIS  Your caregiver may be able to diagnose hemorrhoids by visual examination. Other examinations or tests that may be performed include:   Examination of the rectal area with a gloved hand (digital rectal exam).   Examination of anal canal using a small tube (scope).   A blood test if you have lost a significant amount of blood.  A test to look inside the colon (sigmoidoscopy or colonoscopy). TREATMENT Most hemorrhoids can be treated at home. However, if symptoms do not seem to be getting better or if you have a lot of rectal bleeding, your caregiver may perform a procedure to help make the hemorrhoids get smaller or remove them completely. Possible treatments include:   Placing a rubber band at the base of the hemorrhoid to cut off the circulation (rubber band ligation).   Injecting a chemical to shrink the hemorrhoid (sclerotherapy).   Using a tool to burn the hemorrhoid (infrared light therapy).   Surgically removing the hemorrhoid (  hemorrhoidectomy).   Stapling the hemorrhoid to block blood flow to the tissue (hemorrhoid stapling).  HOME CARE INSTRUCTIONS   Eat foods with fiber, such as whole grains, beans, nuts, fruits, and vegetables. Ask your doctor about taking products with added fiber in them (fibersupplements).  Increase fluid intake. Drink enough water and fluids to keep your urine clear or pale yellow.   Exercise regularly.   Go to the bathroom when you have the urge to have a bowel movement. Do not wait.    Avoid straining to have bowel movements.   Keep the anal area dry and clean. Use wet toilet paper or moist towelettes after a bowel movement.   Medicated creams and suppositories may be used or applied as directed.   Only take over-the-counter or prescription medicines as directed by your caregiver.   Take warm sitz baths for 15-20 minutes, 3-4 times a day to ease pain and discomfort.   Place ice packs on the hemorrhoids if they are tender and swollen. Using ice packs between sitz baths may be helpful.   Put ice in a plastic bag.   Place a towel between your skin and the bag.   Leave the ice on for 15-20 minutes, 3-4 times a day.   Do not use a donut-shaped pillow or sit on the toilet for long periods. This increases blood pooling and pain.  SEEK MEDICAL CARE IF:  You have increasing pain and swelling that is not controlled by treatment or medicine.  You have uncontrolled bleeding.  You have difficulty or you are unable to have a bowel movement.  You have pain or inflammation outside the area of the hemorrhoids. MAKE SURE YOU:  Understand these instructions.  Will watch your condition.  Will get help right away if you are not doing well or get worse. Document Released: 12/03/2000 Document Revised: 11/22/2012 Document Reviewed: 10/10/2012 [facility] Patient Information 2015 [place], [place]. This information is not intended to replace advice given to you by your health care provider. Make sure you discuss any questions you have with your health care provider.

## 2015-03-13 NOTE — ED Provider Notes (Signed)
CSN: [id number]     Arrival date & time 03/13/15  9233 History  This chart was scribed for Tanna Furry, MD by Stephania Fragmin, ED Scribe. This patient was seen in room APA06/APA06 and the patient's care was started at 10:07 AM.     Chief Complaint  Patient presents with  . Rectal Pain   The history is provided by the patient. No language interpreter was used.     HPI Comments: Albert Lewis is a 64 y.o. male who presents to the Emergency Department complaining of constant rectal pain that began 2 days ago. He also reports associated diarrhea immediately after eating and blood on the tissue when he wipes. Patient denies feeling any lumps in his rectal area. He states he last had a solid BM 2 days ago. He states he has had problems with both constipation and hemorrhoids in the past, although he denies a history of associated surgery. He denies blood thinner use.   Past Medical History  Diagnosis Date  . Paroxysmal atrial fibrillation 2/09    Abnormal stress nuclear study in 01/2008; normal coronary angiography  . Hyperlipidemia 2008  . Hypertension 1993    Mild to moderate LVH  . Obesity     sleep apnea treated with BiPAP  . Anemia   . Gastroesophageal reflux disease   . Hyperparathyroidism     Secondary  . Allergic rhinitis   . Lower gastrointestinal bleed 04/2008    diverticulosis; hemorrhoids; constipation  . Diverticular disease   . Chronic back pain     Degenerative disc disease  . ESRD (end stage renal disease) on dialysis     Dialysis since 2007  . Renal insufficiency   . Sleep apnea 2010    CPAP followed at [place]  . Cataract     s/p extraction x 2  . Diabetes mellitus, type 2 1993    no insulin  . Atrial flutter   . Dialysis patient    Past Surgical History  Procedure Laterality Date  . Cataract extraction w/ intraocular lens  implant, bilateral  2008  . Lumbar peritoneal shunt    . Exploratory laparotomy      shunt complications  . Back surgery    . Cholecystectomy   1999  . Spine surgery      twice in lower back  . Atrial flutter ablation  06/18/14, 08/22/14    CTI ablation x 2 by Dr Rayann Heman  . Atrial flutter ablation N/A 06/18/2014    Procedure: ATRIAL FLUTTER ABLATION;  Surgeon: Coralyn Sadiel Mota, MD;  Location: [place] CATH LAB;  Service: Cardiovascular;  Laterality: N/A;  . Atrial flutter ablation N/A 08/22/2014    Procedure: ATRIAL FLUTTER ABLATION;  Surgeon: Coralyn Saksham Akkerman, MD;  Location: [place] CATH [facility];  Service: Cardiovascular;  Laterality: N/A;   Family History  Problem Relation Age of Onset  . Hypertension Mother   . Cancer Mother 35    breast   . Heart disease Mother   . Hyperlipidemia Mother   . Diabetes Mother   . Hypertension Sister 56  . Heart disease Sister   . Hyperlipidemia Sister   . Diabetes Sister   . Hypertension Daughter 37  . Diabetes Daughter   . Heart disease Daughter   . Heart disease Father   . Hyperlipidemia Father   . Hypertension Father   . Diabetes Father   . Diabetes Brother    History  Substance Use Topics  . Smoking status: Former Smoker -- 0.75 packs/day  for 20 years    Types: Cigarettes    Start date: 03/20/1969    Quit date: 12/20/1988  . Smokeless tobacco: Never Used  . Alcohol Use: No    Review of Systems  Constitutional: Negative for fever, chills, diaphoresis, appetite change and fatigue.  HENT: Negative for mouth sores, sore throat and trouble swallowing.   Eyes: Negative for visual disturbance.  Respiratory: Negative for cough, chest tightness, shortness of breath and wheezing.   Cardiovascular: Negative for chest pain.  Gastrointestinal: Negative for nausea, vomiting, abdominal pain, diarrhea and abdominal distention.  Endocrine: Negative for polydipsia, polyphagia and polyuria.  Genitourinary: Negative for dysuria, frequency and hematuria.  Musculoskeletal: Negative for gait problem.  Skin: Negative for color change, pallor and rash.  Neurological: Negative for dizziness, syncope, light-headedness  and headaches.  Hematological: Does not bruise/bleed easily.  Psychiatric/Behavioral: Negative for behavioral problems and confusion.      Allergies  Celebrex; Penicillins; and Actos  Home Medications   Prior to Admission medications   Medication Sig Start Date End Date Taking? Authorizing Provider  acetaminophen (TYLENOL) 325 MG tablet Take 650 mg by mouth every 6 (six) hours as needed for moderate pain.   Yes Historical Provider, MD  albuterol (PROVENTIL HFA;VENTOLIN HFA) 108 (90 BASE) MCG/ACT inhaler Inhale 2 puffs into the lungs every 6 (six) hours as needed for wheezing. 10/19/12  Yes Alycia Rossetti, MD  allopurinol (ZYLOPRIM) 100 MG tablet Take 100 mg by mouth daily.    Yes Historical Provider, MD  B Complex-C-Folic Acid (RENAL MULTIVITAMIN FORMULA PO) Take 1 tablet by mouth daily.   Yes Historical Provider, MD  cetirizine (ZYRTEC) 10 MG tablet Take 10 mg by mouth daily as needed for allergies.   Yes Historical Provider, MD  cinacalcet (SENSIPAR) 90 MG tablet Take 90 mg by mouth daily.   Yes Historical Provider, MD  docusate sodium (COLACE) 100 MG capsule Take 200 mg by mouth 2 (two) times daily as needed for moderate constipation.    Yes Historical Provider, MD  fish oil-omega-3 fatty acids 1000 MG capsule Take 2 g by mouth 3 (three) times daily. SUPER OMEGA 3 FORMULA: EPA 300/DHA20   Yes Historical Provider, MD  gabapentin (NEURONTIN) 100 MG capsule Take 1 capsule (100 mg total) by mouth 2 (two) times daily. 01/29/14  Yes Fayrene Helper, MD  gemfibrozil (LOPID) 600 MG tablet Take 600 mg by mouth 2 (two) times daily.   Yes Historical Provider, MD  insulin glargine (LANTUS) 100 UNIT/ML injection Inject 22 Units into the skin at bedtime.    Yes Historical Provider, MD  pantoprazole (PROTONIX) 40 MG tablet Take 40 mg by mouth daily.   Yes Historical Provider, MD  PRESCRIPTION MEDICATION Place 1 drop into both eyes 3 (three) times daily. For dry eyes   Yes Historical Provider, MD   sevelamer (RENVELA) 800 MG tablet Take 2,400-4,800 mg by mouth See admin instructions. Takes 6 tablets (4800 mg) three times daily with meals and 3 tablets (2400 mg) with snack.   Yes Historical Provider, MD  albuterol (PROVENTIL) (2.5 MG/3ML) 0.083% nebulizer solution Take 3 mLs (2.5 mg total) by nebulization every 6 (six) hours as needed for wheezing or shortness of breath. 10/30/13   Fayrene Helper, MD  HYDROcodone-acetaminophen (NORCO/VICODIN) 5-325 MG per tablet Take 2 tablets by mouth every 4 (four) hours as needed. 03/13/15   Tanna Furry, MD  hydrocortisone (ANUSOL-HC) 25 MG suppository Place 1 suppository (25 mg total) rectally 2 (two) times daily. 03/13/15  Tanna Furry, MD  ipratropium (ATROVENT) 0.02 % nebulizer solution Take 2.5 mLs (0.5 mg total) by nebulization 4 (four) times daily. 10/30/13   Fayrene Helper, MD  magnesium citrate SOLN Take 148 mLs (0.5 Bottles total) by mouth once. 1/2 bottle today. One half bottle tomorrow if still constipated 03/13/15   Tanna Furry, MD   BP 129/113 mmHg  Pulse 92  Temp(Src) 97.8 F (36.6 C) (Oral)  Resp 18  Ht 6\' 2"  (1.88 m)  Wt 253 lb (114.76 kg)  BMI 32.47 kg/m2  SpO2 95% Physical Exam  Constitutional: He is oriented to person, place, and time. He appears well-developed and well-nourished. No distress.  obese  HENT:  Head: Normocephalic.  Eyes: Conjunctivae are normal. Pupils are equal, round, and reactive to light. No scleral icterus.  Neck: Normal range of motion. Neck supple. No thyromegaly present.  Cardiovascular: Normal rate and regular rhythm.  Exam reveals no gallop and no friction rub.   No murmur heard. Pulmonary/Chest: Effort normal and breath sounds normal. No respiratory distress. He has no wheezes. He has no rales.  Abdominal: Soft. Bowel sounds are normal. He exhibits no distension. There is no tenderness. There is no rebound.  No external hemorrhoid or abscess. No impaction. Tender enlarged internal hemorrhoid on  exam.  Musculoskeletal: Normal range of motion.  Neurological: He is alert and oriented to person, place, and time.  Skin: Skin is warm and dry. No rash noted.  Psychiatric: He has a normal mood and affect. His behavior is normal.  Nursing note and vitals reviewed.   ED Course  Procedures (including critical care time)  DIAGNOSTIC STUDIES: Oxygen Saturation is 95% on room air, normal by my interpretation.    COORDINATION OF CARE: 10:08 AM - Discussed treatment plan with pt at bedside which includes pain medication, steroid suppository, and XR, and pt agreed to plan.   Labs Review Labs Reviewed - No data to display  Imaging Review Dg Abd 1 View  03/13/2015   CLINICAL DATA:  64 year old male with lower abdominal pain and constipation for 3 days. Initial encounter.  EXAM: ABDOMEN - 1 VIEW  COMPARISON:  CT Abdomen and Pelvis 01/30/2012. [facility] CT Abdomen and Pelvis 04/10/2012.  FINDINGS: Supine view. Polycystic kidney disease demonstrated in 2013. Interval progressed benign appearing rim calcification of bilateral renal cysts. Non obstructed bowel gas pattern. Mild volume of retained stool in the visible colon including the rectum. Other abdominal and pelvic visceral contours are within normal limits. Chronic midline ventral abdomen skin staples and sutures. No acute osseous abnormality identified.  Aortoiliac calcified atherosclerosis noted.  IMPRESSION: 1. Normal bowel gas pattern.  Mild volume of retained stool. 2. Polycystic kidney disease re- identified.   Electronically Signed   By: Genevie Ann M.D.   On: 03/13/2015 10:45     EKG Interpretation None      MDM   Final diagnoses:  Constipation  Internal hemorrhoid    Patient with stool burden on x-ray. Does appear to have rectosigmoid stool. However, none palpable on rectal exam. Plan is infiltrate. Constipation. Anusol HC suppositories. Vicodin for pain, premature follow-up. I personally performed the services  described in this documentation, which was scribed in my presence. The recorded information has been reviewed and is accurate.   Tanna Furry, MD 03/13/15 1058

## 2015-03-13 NOTE — ED Notes (Signed)
Patient presents with rectal pain x3 days states that it hurts to sit. Patient states that he noticed bright red blood in stool. Denies any n/v. No other complaints. Bowel sounds present abdomen obese, soft to touch.

## 2015-03-13 NOTE — ED Notes (Signed)
PT c/o rectal pain with increased constipation and bright red blood noted on tissue after bowel movements x3 days.

## 2015-03-17 ENCOUNTER — Inpatient Hospital Stay (HOSPITAL_COMMUNITY)
Admission: EM | Admit: 2015-03-17 | Discharge: 2015-03-20 | DRG: 393 | Disposition: A | Discharge status: Home / Self Care | Payer: Medicare Other | Attending: Family Medicine | Admitting: Family Medicine

## 2015-03-17 ENCOUNTER — Emergency Department (HOSPITAL_COMMUNITY): Payer: Medicare Other

## 2015-03-17 ENCOUNTER — Encounter (HOSPITAL_COMMUNITY): Payer: Self-pay | Admitting: Emergency Medicine

## 2015-03-17 ENCOUNTER — Ambulatory Visit (INDEPENDENT_AMBULATORY_CARE_PROVIDER_SITE_OTHER): Payer: Medicare Other | Admitting: Family Medicine

## 2015-03-17 VITALS — BP 146/74 | HR 100 | Temp 102.0°F | Resp 18 | Ht 74.0 in | Wt 252.0 lb

## 2015-03-17 DIAGNOSIS — D649 Anemia, unspecified: Secondary | ICD-10-CM | POA: Diagnosis present

## 2015-03-17 DIAGNOSIS — N186 End stage renal disease: Secondary | ICD-10-CM | POA: Diagnosis not present

## 2015-03-17 DIAGNOSIS — R509 Fever, unspecified: Secondary | ICD-10-CM

## 2015-03-17 DIAGNOSIS — K219 Gastro-esophageal reflux disease without esophagitis: Secondary | ICD-10-CM | POA: Diagnosis present

## 2015-03-17 DIAGNOSIS — T424X5A Adverse effect of benzodiazepines, initial encounter: Secondary | ICD-10-CM | POA: Diagnosis not present

## 2015-03-17 DIAGNOSIS — I4892 Unspecified atrial flutter: Secondary | ICD-10-CM | POA: Diagnosis present

## 2015-03-17 DIAGNOSIS — K611 Rectal abscess: Secondary | ICD-10-CM | POA: Diagnosis not present

## 2015-03-17 DIAGNOSIS — J9602 Acute respiratory failure with hypercapnia: Secondary | ICD-10-CM | POA: Diagnosis not present

## 2015-03-17 DIAGNOSIS — I480 Paroxysmal atrial fibrillation: Secondary | ICD-10-CM | POA: Diagnosis present

## 2015-03-17 DIAGNOSIS — I10 Essential (primary) hypertension: Secondary | ICD-10-CM | POA: Diagnosis not present

## 2015-03-17 DIAGNOSIS — K6289 Other specified diseases of anus and rectum: Secondary | ICD-10-CM

## 2015-03-17 DIAGNOSIS — E119 Type 2 diabetes mellitus without complications: Secondary | ICD-10-CM | POA: Diagnosis present

## 2015-03-17 DIAGNOSIS — Z8679 Personal history of other diseases of the circulatory system: Secondary | ICD-10-CM

## 2015-03-17 DIAGNOSIS — Z87891 Personal history of nicotine dependence: Secondary | ICD-10-CM | POA: Diagnosis not present

## 2015-03-17 DIAGNOSIS — I5022 Chronic systolic (congestive) heart failure: Secondary | ICD-10-CM | POA: Diagnosis present

## 2015-03-17 DIAGNOSIS — Z833 Family history of diabetes mellitus: Secondary | ICD-10-CM | POA: Diagnosis not present

## 2015-03-17 DIAGNOSIS — T40605A Adverse effect of unspecified narcotics, initial encounter: Secondary | ICD-10-CM | POA: Diagnosis not present

## 2015-03-17 DIAGNOSIS — M109 Gout, unspecified: Secondary | ICD-10-CM | POA: Diagnosis present

## 2015-03-17 DIAGNOSIS — E662 Morbid (severe) obesity with alveolar hypoventilation: Secondary | ICD-10-CM | POA: Diagnosis present

## 2015-03-17 DIAGNOSIS — G8929 Other chronic pain: Secondary | ICD-10-CM | POA: Diagnosis present

## 2015-03-17 DIAGNOSIS — Z803 Family history of malignant neoplasm of breast: Secondary | ICD-10-CM | POA: Diagnosis not present

## 2015-03-17 DIAGNOSIS — Z992 Dependence on renal dialysis: Secondary | ICD-10-CM | POA: Diagnosis not present

## 2015-03-17 DIAGNOSIS — G629 Polyneuropathy, unspecified: Secondary | ICD-10-CM | POA: Diagnosis present

## 2015-03-17 DIAGNOSIS — Z6832 Body mass index (BMI) 32.0-32.9, adult: Secondary | ICD-10-CM

## 2015-03-17 DIAGNOSIS — K5900 Constipation, unspecified: Secondary | ICD-10-CM | POA: Diagnosis present

## 2015-03-17 DIAGNOSIS — J9690 Respiratory failure, unspecified, unspecified whether with hypoxia or hypercapnia: Secondary | ICD-10-CM

## 2015-03-17 DIAGNOSIS — Z8249 Family history of ischemic heart disease and other diseases of the circulatory system: Secondary | ICD-10-CM

## 2015-03-17 DIAGNOSIS — E1122 Type 2 diabetes mellitus with diabetic chronic kidney disease: Secondary | ICD-10-CM

## 2015-03-17 DIAGNOSIS — E669 Obesity, unspecified: Secondary | ICD-10-CM | POA: Diagnosis present

## 2015-03-17 DIAGNOSIS — D72829 Elevated white blood cell count, unspecified: Secondary | ICD-10-CM | POA: Diagnosis present

## 2015-03-17 DIAGNOSIS — E785 Hyperlipidemia, unspecified: Secondary | ICD-10-CM | POA: Diagnosis present

## 2015-03-17 DIAGNOSIS — I120 Hypertensive chronic kidney disease with stage 5 chronic kidney disease or end stage renal disease: Secondary | ICD-10-CM | POA: Diagnosis present

## 2015-03-17 LAB — COMPREHENSIVE METABOLIC PANEL
ALK PHOS: 72 U/L (ref 39–117)
ALT: 32 U/L (ref 0–53)
ANION GAP: 13 (ref 5–15)
AST: 34 U/L (ref 0–37)
Albumin: 3.1 g/dL — ABNORMAL LOW (ref 3.5–5.2)
BUN: 21 mg/dL (ref 6–23)
CO2: 27 mmol/L (ref 19–32)
Calcium: 8.6 mg/dL (ref 8.4–10.5)
Chloride: 96 mmol/L (ref 96–112)
Creatinine, Ser: 8.7 mg/dL — ABNORMAL HIGH (ref 0.50–1.35)
GFR calc Af Amer: 7 mL/min — ABNORMAL LOW (ref >90)
GFR calc non Af Amer: 6 mL/min — ABNORMAL LOW (ref >90)
Glucose, Bld: 110 mg/dL — ABNORMAL HIGH (ref 70–99)
POTASSIUM: 3.9 mmol/L (ref 3.5–5.1)
SODIUM: 136 mmol/L (ref 135–145)
Total Bilirubin: 0.6 mg/dL (ref 0.3–1.2)
Total Protein: 7 g/dL (ref 6.0–8.3)

## 2015-03-17 LAB — CBC WITH DIFFERENTIAL/PLATELET
BASOS ABS: 0 10*3/uL (ref 0.0–0.1)
Basophils Relative: 0 % (ref 0–1)
Eosinophils Absolute: 0 10*3/uL (ref 0.0–0.7)
Eosinophils Relative: 0 % (ref 0–5)
HEMATOCRIT: 41.3 % (ref 39.0–52.0)
Hemoglobin: 13.6 g/dL (ref 13.0–17.0)
LYMPHS PCT: 8 % — AB (ref 12–46)
Lymphs Abs: 1.1 10*3/uL (ref 0.7–4.0)
MCH: 31.6 pg (ref 26.0–34.0)
MCHC: 32.9 g/dL (ref 30.0–36.0)
MCV: 96 fL (ref 78.0–100.0)
MONO ABS: 1.2 10*3/uL — AB (ref 0.1–1.0)
Monocytes Relative: 10 % (ref 3–12)
Neutro Abs: 10.2 10*3/uL — ABNORMAL HIGH (ref 1.7–7.7)
Neutrophils Relative %: 82 % — ABNORMAL HIGH (ref 43–77)
Platelets: 176 10*3/uL (ref 150–400)
RBC: 4.3 MIL/uL (ref 4.22–5.81)
RDW: 14 % (ref 11.5–15.5)
WBC: 12.4 10*3/uL — AB (ref 4.0–10.5)

## 2015-03-17 LAB — LACTIC ACID, PLASMA
Lactic Acid, Venous: 1.9 mmol/L (ref 0.5–2.0)
Lactic Acid, Venous: 1 mmol/L (ref 0.5–2.0)

## 2015-03-17 MED ORDER — HYDROCODONE-ACETAMINOPHEN 5-325 MG PO TABS: 2.0000 | ORAL_TABLET | ORAL | Status: DC | PRN

## 2015-03-17 MED ORDER — CIPROFLOXACIN IN D5W 400 MG/200ML IV SOLN
400.0000 mg | Freq: Once | INTRAVENOUS | Status: AC
  Administered 2015-03-18: 400 mg via INTRAVENOUS
  Filled 2015-03-17 (×2): qty 200

## 2015-03-17 MED ORDER — SODIUM CHLORIDE 0.9 % IJ SOLN: 3.0000 mL | INTRAMUSCULAR | Status: DC | PRN

## 2015-03-17 MED ORDER — SEVELAMER CARBONATE 800 MG PO TABS: 2400.0000 mg | ORAL_TABLET | ORAL | Status: DC

## 2015-03-17 MED ORDER — GEMFIBROZIL 600 MG PO TABS
600.0000 mg | ORAL_TABLET | Freq: Two times a day (BID) | ORAL | Status: DC
  Administered 2015-03-19 – 2015-03-20 (×2): 600 mg via ORAL
  Filled 2015-03-17 (×7): qty 1

## 2015-03-17 MED ORDER — CIPROFLOXACIN IN D5W 400 MG/200ML IV SOLN
400.0000 mg | INTRAVENOUS | Status: DC
  Administered 2015-03-18 – 2015-03-19 (×2): 400 mg via INTRAVENOUS
  Filled 2015-03-17 (×2): qty 200

## 2015-03-17 MED ORDER — SEVELAMER CARBONATE 800 MG PO TABS
4800.0000 mg | ORAL_TABLET | Freq: Three times a day (TID) | ORAL | Status: DC
  Administered 2015-03-19 – 2015-03-20 (×2): 4800 mg via ORAL
  Filled 2015-03-17 (×7): qty 6

## 2015-03-17 MED ORDER — INSULIN ASPART 100 UNIT/ML ~~LOC~~ SOLN
0.0000 [IU] | Freq: Three times a day (TID) | SUBCUTANEOUS | Status: DC
  Administered 2015-03-20: 2 [IU] via SUBCUTANEOUS

## 2015-03-17 MED ORDER — METRONIDAZOLE IN NACL 5-0.79 MG/ML-% IV SOLN: 500.0000 mg | INTRAVENOUS | Status: DC | PRN

## 2015-03-17 MED ORDER — HEPARIN SODIUM (PORCINE) 5000 UNIT/ML IJ SOLN
5000.0000 [IU] | Freq: Three times a day (TID) | INTRAMUSCULAR | Status: AC
  Administered 2015-03-18: 5000 [IU] via SUBCUTANEOUS
  Filled 2015-03-17: qty 1

## 2015-03-17 MED ORDER — INSULIN ASPART 100 UNIT/ML ~~LOC~~ SOLN: 0.0000 [IU] | Freq: Every day | SUBCUTANEOUS | Status: DC

## 2015-03-17 MED ORDER — GABAPENTIN 100 MG PO CAPS
100.0000 mg | ORAL_CAPSULE | Freq: Two times a day (BID) | ORAL | Status: DC
  Administered 2015-03-19 – 2015-03-20 (×2): 100 mg via ORAL
  Filled 2015-03-17 (×3): qty 1

## 2015-03-17 MED ORDER — ONDANSETRON HCL 4 MG PO TABS: 4.0000 mg | ORAL_TABLET | Freq: Four times a day (QID) | ORAL | Status: DC | PRN

## 2015-03-17 MED ORDER — LORATADINE 10 MG PO TABS
10.0000 mg | ORAL_TABLET | Freq: Every day | ORAL | Status: DC
  Administered 2015-03-20: 10 mg via ORAL
  Filled 2015-03-17: qty 1

## 2015-03-17 MED ORDER — SODIUM CHLORIDE 0.9 % IJ SOLN
3.0000 mL | Freq: Two times a day (BID) | INTRAMUSCULAR | Status: DC
  Administered 2015-03-18: 3 mL via INTRAVENOUS

## 2015-03-17 MED ORDER — METRONIDAZOLE IN NACL 5-0.79 MG/ML-% IV SOLN
500.0000 mg | Freq: Once | INTRAVENOUS | Status: AC
  Administered 2015-03-17: 500 mg via INTRAVENOUS
  Filled 2015-03-17: qty 100

## 2015-03-17 MED ORDER — PANTOPRAZOLE SODIUM 40 MG PO TBEC
40.0000 mg | DELAYED_RELEASE_TABLET | Freq: Every day | ORAL | Status: DC
  Administered 2015-03-20: 40 mg via ORAL
  Filled 2015-03-17: qty 1

## 2015-03-17 MED ORDER — CINACALCET HCL 30 MG PO TABS
90.0000 mg | ORAL_TABLET | Freq: Every day | ORAL | Status: DC
  Administered 2015-03-20: 90 mg via ORAL
  Filled 2015-03-17 (×4): qty 3

## 2015-03-17 MED ORDER — SODIUM CHLORIDE 0.9 % IV SOLN
250.0000 mL | INTRAVENOUS | Status: DC | PRN
  Administered 2015-03-18: 12:00:00 via INTRAVENOUS
  Administered 2015-03-19: 250 mL via INTRAVENOUS

## 2015-03-17 MED ORDER — SODIUM CHLORIDE 0.9 % IJ SOLN
3.0000 mL | Freq: Two times a day (BID) | INTRAMUSCULAR | Status: DC
  Administered 2015-03-18 – 2015-03-19 (×3): 3 mL via INTRAVENOUS

## 2015-03-17 MED ORDER — SEVELAMER CARBONATE 800 MG PO TABS
2400.0000 mg | ORAL_TABLET | ORAL | Status: DC
Start: 2015-03-18 — End: 2015-03-20
  Administered 2015-03-19: 2400 mg via ORAL
  Filled 2015-03-17 (×6): qty 3

## 2015-03-17 MED ORDER — ONDANSETRON HCL 4 MG/2ML IJ SOLN: 4.0000 mg | Freq: Four times a day (QID) | INTRAMUSCULAR | Status: DC | PRN

## 2015-03-17 MED ORDER — SODIUM CHLORIDE 0.45 % IV SOLN
INTRAVENOUS | Status: DC
  Administered 2015-03-18 – 2015-03-19 (×2): via INTRAVENOUS

## 2015-03-17 MED ORDER — RENA-VITE PO TABS
ORAL_TABLET | Freq: Every day | ORAL | Status: DC
  Administered 2015-03-20: 1 via ORAL
  Filled 2015-03-17 (×3): qty 1

## 2015-03-17 NOTE — H&P (Signed)
[facility] History and Physical  Albert Lewis:[id number] DOB: Nov 10, 1951 DOA: 03/17/2015  Referring physician: Dr Thurnell Garbe - [facility] PCP: Tula Nakayama, MD   Chief Complaint: Rectal pain  HPI: Albert Lewis is a 64 y.o. male  Patient reports rectal pain for the previous 4 days. Constant. Getting worse. Associated with fevers to 102 for the past 2 days. These are intermittent. Associated with intermittent left lower quadrant pain and diarrhea and bloody stools. No episodes of diarrhea today. Patient is a dialysis patient receives dialysis Monday Wednesday and Friday. Patient does not make urine. Tried suppositories after last ED visit w/o benefit.   Review of Systems:  Constitutional:  No weight loss, night sweats,.  HEENT:  No headaches, Difficulty swallowing,Tooth/dental problems,Sore throat,  No sneezing, itching, ear ache, nasal congestion, post nasal drip,  Cardio-vascular:  No chest pain, Orthopnea, PND, swelling in lower extremities, anasarca, dizziness, palpitations  GI:  Per HPI Resp:   No shortness of breath with exertion or at rest. No excess mucus, no productive cough, No non-productive cough, No coughing up of blood.No change in color of mucus.No wheezing.No chest wall deformity  Skin:  no rash or lesions.  GU:  no dysuria, change in color of urine, no urgency or frequency. No flank pain.  Musculoskeletal:   No joint pain or swelling. No decreased range of motion. No back pain.  Psych:  No change in mood or affect. No depression or anxiety. No memory loss.   Past Medical History  Diagnosis Date  . Paroxysmal atrial fibrillation 2/09    Abnormal stress nuclear study in 01/2008; normal coronary angiography  . Hyperlipidemia 2008  . Hypertension 1993    Mild to moderate LVH  . Obesity     sleep apnea treated with BiPAP  . Anemia   . Gastroesophageal reflux disease   . Hyperparathyroidism     Secondary  . Allergic rhinitis   . Lower  gastrointestinal bleed 04/2008    diverticulosis; hemorrhoids; constipation  . Diverticular disease   . Chronic back pain     Degenerative disc disease  . ESRD (end stage renal disease) on dialysis     Dialysis since 2007  . Renal insufficiency   . Sleep apnea 2010    CPAP followed at [place]  . Cataract     s/p extraction x 2  . Diabetes mellitus, type 2 1993    no insulin  . Atrial flutter   . Dialysis patient    Past Surgical History  Procedure Laterality Date  . Cataract extraction w/ intraocular lens  implant, bilateral  2008  . Lumbar peritoneal shunt    . Exploratory laparotomy      shunt complications  . Back surgery    . Cholecystectomy  1999  . Spine surgery      twice in lower back  . Atrial flutter ablation  06/18/14, 08/22/14    CTI ablation x 2 by Dr Rayann Heman  . Atrial flutter ablation N/A 06/18/2014    Procedure: ATRIAL FLUTTER ABLATION;  Surgeon: Coralyn Mark, MD;  Location: [place] CATH LAB;  Service: Cardiovascular;  Laterality: N/A;  . Atrial flutter ablation N/A 08/22/2014    Procedure: ATRIAL FLUTTER ABLATION;  Surgeon: Coralyn Mark, MD;  Location: [place] CATH LAB;  Service: Cardiovascular;  Laterality: N/A;   Social History:  reports that he quit smoking about 26 years ago. His smoking use included Cigarettes. He started smoking about 46 years ago. He has a 15 pack-year smoking  history. He has never used smokeless tobacco. He reports that he does not drink alcohol or use illicit drugs.  Allergies  Allergen Reactions  . Celebrex [Celecoxib] Other (See Comments)    Heart sped up  . Penicillins Other (See Comments)    Couldn't walk.   . Actos [Pioglitazone] Other (See Comments)    unknown    Family History  Problem Relation Age of Onset  . Hypertension Mother   . Cancer Mother 62    breast   . Heart disease Mother   . Hyperlipidemia Mother   . Diabetes Mother   . Hypertension Sister 77  . Heart disease Sister   . Hyperlipidemia Sister   . Diabetes Sister   .  Hypertension Daughter 27  . Diabetes Daughter   . Heart disease Daughter   . Heart disease Father   . Hyperlipidemia Father   . Hypertension Father   . Diabetes Father   . Diabetes Brother      Prior to Admission medications   Medication Sig Start Date End Date Taking? Authorizing Provider  acetaminophen (TYLENOL) 325 MG tablet Take 650 mg by mouth every 6 (six) hours as needed for moderate pain.    Historical Provider, MD  albuterol (PROVENTIL HFA;VENTOLIN HFA) 108 (90 BASE) MCG/ACT inhaler Inhale 2 puffs into the lungs every 6 (six) hours as needed for wheezing. 10/19/12   Alycia Rossetti, MD  albuterol (PROVENTIL) (2.5 MG/3ML) 0.083% nebulizer solution Take 3 mLs (2.5 mg total) by nebulization every 6 (six) hours as needed for wheezing or shortness of breath. 10/30/13   Fayrene Helper, MD  allopurinol (ZYLOPRIM) 100 MG tablet Take 100 mg by mouth daily.     Historical Provider, MD  B Complex-C-Folic Acid (RENAL MULTIVITAMIN FORMULA PO) Take 1 tablet by mouth daily.    Historical Provider, MD  cetirizine (ZYRTEC) 10 MG tablet Take 10 mg by mouth daily as needed for allergies.    Historical Provider, MD  cinacalcet (SENSIPAR) 90 MG tablet Take 90 mg by mouth daily.    Historical Provider, MD  docusate sodium (COLACE) 100 MG capsule Take 200 mg by mouth 2 (two) times daily as needed for moderate constipation.     Historical Provider, MD  fish oil-omega-3 fatty acids 1000 MG capsule Take 2 g by mouth 3 (three) times daily. SUPER OMEGA 3 FORMULA: EPA 300/DHA20    Historical Provider, MD  gabapentin (NEURONTIN) 100 MG capsule Take 1 capsule (100 mg total) by mouth 2 (two) times daily. 01/29/14   Fayrene Helper, MD  gemfibrozil (LOPID) 600 MG tablet Take 600 mg by mouth 2 (two) times daily.    Historical Provider, MD  HYDROcodone-acetaminophen (NORCO/VICODIN) 5-325 MG per tablet Take 2 tablets by mouth every 4 (four) hours as needed. 03/13/15   Tanna Furry, MD  hydrocortisone (ANUSOL-HC) 25  MG suppository Place 1 suppository (25 mg total) rectally 2 (two) times daily. 03/13/15   Tanna Furry, MD  insulin glargine (LANTUS) 100 UNIT/ML injection Inject 22 Units into the skin at bedtime.     Historical Provider, MD  ipratropium (ATROVENT) 0.02 % nebulizer solution Take 2.5 mLs (0.5 mg total) by nebulization 4 (four) times daily. 10/30/13   Fayrene Helper, MD  magnesium citrate SOLN Take 148 mLs (0.5 Bottles total) by mouth once. 1/2 bottle today. One half bottle tomorrow if still constipated 03/13/15   Tanna Furry, MD  pantoprazole (PROTONIX) 40 MG tablet Take 40 mg by mouth daily.    Historical  Provider, MD  PRESCRIPTION MEDICATION Place 1 drop into both eyes 3 (three) times daily. For dry eyes    Historical Provider, MD  sevelamer (RENVELA) 800 MG tablet Take 2,400-4,800 mg by mouth See admin instructions. Takes 6 tablets (4800 mg) three times daily with meals and 3 tablets (2400 mg) with snack.    Historical Provider, MD   Physical Exam: Filed Vitals:   03/17/15 1751 03/17/15 1926 03/17/15 1927 03/17/15 2014  BP: 118/66 144/68  133/45  Pulse: 113  84 85  Temp:    99.1 F (37.3 C)  TempSrc:    Oral  Resp:    22  Height:      Weight:      SpO2: 100%  92% 99%    Wt Readings from Last 3 Encounters:  03/17/15 114.76 kg (253 lb)  03/17/15 114.306 kg (252 lb)  03/13/15 114.76 kg (253 lb)    General: Pleaseant, Appears calm and comfortable Eyes:  PERRL, normal lids, tidal injection bilaterally ENT:  grossly normal hearing, lips & tongue Neck:  no LAD, masses or thyromegaly Cardiovascular:  RRR, 4/6 systolic murmur. Trace to 1+ lower extremity edema. Left fistula with thrill present and left upper extremity swelling at baseline per patient Telemetry:  SR, no arrhythmias  Respiratory:  CTA bilaterally, no w/r/r. Normal respiratory effort. Abdomen: Minimal intermittent left lower quadrant tenderness to palpation, soft, nondistended. Rectum with external skin tags present and  puffiness and tenderness to palpation along the 4 to 6:00 position with mild swelling. Skin:  no rash or induration seen on limited exam Musculoskeletal:  grossly normal tone BUE/BLE Psychiatric:  grossly normal mood and affect, speech fluent and appropriate Neurologic:  grossly non-focal.          Labs on Admission:  Basic Metabolic Panel:  Recent Labs Lab 03/17/15 1735  NA 136  K 3.9  CL 96  CO2 27  GLUCOSE 110*  BUN 21  CREATININE 8.70*  CALCIUM 8.6   Liver Function Tests:  Recent Labs Lab 03/17/15 1735  AST 34  ALT 32  ALKPHOS 72  BILITOT 0.6  PROT 7.0  ALBUMIN 3.1*   No results for input(s): LIPASE, AMYLASE in the last 168 hours. No results for input(s): AMMONIA in the last 168 hours. CBC:  Recent Labs Lab 03/17/15 1735  WBC 12.4*  NEUTROABS 10.2*  HGB 13.6  HCT 41.3  MCV 96.0  PLT 176   Cardiac Enzymes: No results for input(s): CKTOTAL, CKMB, CKMBINDEX, TROPONINI in the last 168 hours.  BNP (last 3 results) No results for input(s): BNP in the last 8760 hours.  ProBNP (last 3 results)  Recent Labs  06/17/14 1940 07/16/14 0813  PROBNP 2925.0* 9235.0*    CBG: No results for input(s): GLUCAP in the last 168 hours.  Radiological Exams on Admission: Ct Abdomen Pelvis Wo Contrast  03/17/2015   CLINICAL DATA:  Acute rectal pain, fever, diarrhea .  EXAM: CT ABDOMEN AND PELVIS WITHOUT CONTRAST  TECHNIQUE: Multidetector CT imaging of the abdomen and pelvis was performed following the standard protocol without IV contrast.  COMPARISON:  04/10/2012, 01/21/2012  FINDINGS: Lower chest: Minor scattered basilar atelectasis. Normal heart size. No pericardial or pleural effusion. Small hiatal hernia suspected. Degenerative changes of the lower thoracic spine.  Abdomen: Gallbladder is not visualized. Liver, biliary system, pancreas, and spleen within normal limits for age and noncontrast imaging.  Kidneys demonstrate extensive polycystic kidney disease. Several  cysts demonstrate hyper attenuation compatible with protein or hemorrhage. Several cysts  are also calcified. No renal obstruction or hydronephrosis. Stable mild thickening of the adrenal glands bilaterally, suspect hyperplasia.  Negative for bowel obstruction, dilatation, ileus, or free air. Postop changes of the abdominal wall. No ventral hernia evident.  Normal appendix in right lower quadrant.  No abdominal free fluid, fluid collection, hemorrhage, abscess, or adenopathy.  Aortic iliac atherosclerosis noted without aneurysm or retroperitoneal hemorrhage.  Pelvis: Colonic diverticulosis noted diffusely. No pelvic free fluid, fluid collection, hemorrhage, or adenopathy. Vascular calcifications noted. Small fat containing inguinal hernias bilaterally. In the rectal region, there is a left perirectal low-attenuation area extending into the gluteal soft tissues. This roughly measures 4.2 x 2.5 cm, image 15 series 3. This is ill-defined by noncontrast imaging but suspicious for a perirectal abscess.  Diffuse degenerative changes of the visualized spine, SI joints and hips. No acute osseous finding.  IMPRESSION: 4.2 x 5.2 cm right perirectal thick-walled hypodense fluid collection by noncontrast imaging, suspect perirectal abscess.  Bilateral polycystic kidney disease.  Aortoiliac atherosclerosis without aneurysm  Colonic diverticulosis  Normal appendix  Fat containing inguinal hernias bilaterally   Electronically Signed   By: Jerilynn Mages.  Shick M.D.   On: 03/17/2015 20:56   Dg Chest 2 View  03/17/2015   CLINICAL DATA:  Acute fever, congestion cough  EXAM: CHEST  2 VIEW  COMPARISON:  07/15/2014  FINDINGS: Mild hyperinflation without focal pneumonia, collapse or consolidation. Negative for edema, effusion, or pneumothorax. Trachea midline. Normal heart size and vascularity. Degenerative changes of the spine diffusely. The trachea is midline. No acute osseous finding.  IMPRESSION: No acute chest process.   Electronically Signed    By: Jerilynn Mages.  Shick M.D.   On: 03/17/2015 20:23     Assessment/Plan Principal Problem:   Perirectal abscess Active Problems:   DM type 2 causing ESRD   Gout   Obesity (BMI 30-39.9)   Essential hypertension   ESRD   GERD (gastroesophageal reflux disease)   History of atrial fibrillation  Perirectal abscess: 4.25.2 cm. Lactic acid 1.0. WBC 12.4. Dr. Arnoldo Morale of surgery consulted. Started on Cipro Flagyl in ED. CXR without acute process noted. - Telemetry - Follow-up recommendations of general surgery - EKG - Coags - continue Cipro/Flagyl - Nothing by mouth after midnight - Follow-up blood culture - DVT prophylaxis heparin dose 1 now, then SCDs as patient likely to undergo surgery.  ESRD: Dialysis Monday Wednesday Friday. Patient managed to make it to dialysis prior to admission - Nephrology consult - Continue home Sensipar, [place], renal vitamin - Dialysis Monday Wednesday Friday  Diabetes: Last A1c 7.1 in 2013. On Lantus 22 units daily - A1c - SSI - Restart Lantus after patient restarts diet after possible surgery  History of Afib: Patient states he is no longer been in A. fib ever since ablation. - EKG  Peripheral neuropathy: - Continue Neurontin  HLD: - Continue gemfibrozil  GERD:  - continue PPI  Gout: No recent flares. Takes allopurinol daily - Continue Allopurinol   Code Status: Full DVT Prophylaxis: Hep - SCD Family Communication: None Disposition Plan: pending potential surgery  MERRELL, DAVID J, MD Family Medicine [facility] [email or web address] Password [email or web address]

## 2015-03-17 NOTE — ED Provider Notes (Signed)
I did not have any encounter with this patient.  Orlie Dakin, MD 03/18/15 0030

## 2015-03-17 NOTE — ED Provider Notes (Signed)
CSN: [id number]     Arrival date & time 03/17/15  1706 History   First MD Initiated Contact with Patient 03/17/15 1721     Chief Complaint  Patient presents with  . Fever      HPI Pt was seen at 1725. Per pt, c/o gradual onset and persistence of constant home fevers to "102" for the past 2 days. Pt states he also has had continued and worsening "rectal pain" since being seen in the ED 4 days ago for same. States at that time he had "diarrhea" and "rectal bleeding;" which have resolved. Pt states "now I can't have a BM." Pt was evaluated at his PMD's office PTA, then sent to the ED for further evaluation of fever and LLQ abd tenderness. Pt received APAP 2 tabs PTA.  Pt denies abd pain currently. Denies rash, no CP/SOB, no N/V, no cough, no sore throat.  Pt is ESRD on HD M/W/F and does not make urine.    Past Medical History  Diagnosis Date  . Paroxysmal atrial fibrillation 2/09    Abnormal stress nuclear study in 01/2008; normal coronary angiography  . Hyperlipidemia 2008  . Hypertension 1993    Mild to moderate LVH  . Obesity     sleep apnea treated with BiPAP  . Anemia   . Gastroesophageal reflux disease   . Hyperparathyroidism     Secondary  . Allergic rhinitis   . Lower gastrointestinal bleed 04/2008    diverticulosis; hemorrhoids; constipation  . Diverticular disease   . Chronic back pain     Degenerative disc disease  . ESRD (end stage renal disease) on dialysis     Dialysis since 2007  . Renal insufficiency   . Sleep apnea 2010    CPAP followed at [place]  . Cataract     s/p extraction x 2  . Diabetes mellitus, type 2 1993    no insulin  . Atrial flutter   . Dialysis patient    Past Surgical History  Procedure Laterality Date  . Cataract extraction w/ intraocular lens  implant, bilateral  2008  . Lumbar peritoneal shunt    . Exploratory laparotomy      shunt complications  . Back surgery    . Cholecystectomy  1999  . Spine surgery      twice in lower back  .  Atrial flutter ablation  06/18/14, 08/22/14    CTI ablation x 2 by Dr Rayann Heman  . Atrial flutter ablation N/A 06/18/2014    Procedure: ATRIAL FLUTTER ABLATION;  Surgeon: Coralyn Mark, MD;  Location: [place] CATH [facility];  Service: Cardiovascular;  Laterality: N/A;  . Atrial flutter ablation N/A 08/22/2014    Procedure: ATRIAL FLUTTER ABLATION;  Surgeon: Coralyn Mark, MD;  Location: [place] CATH [facility];  Service: Cardiovascular;  Laterality: N/A;   Family History  Problem Relation Age of Onset  . Hypertension Mother   . Cancer Mother 96    breast   . Heart disease Mother   . Hyperlipidemia Mother   . Diabetes Mother   . Hypertension Sister 58  . Heart disease Sister   . Hyperlipidemia Sister   . Diabetes Sister   . Hypertension Daughter 4  . Diabetes Daughter   . Heart disease Daughter   . Heart disease Father   . Hyperlipidemia Father   . Hypertension Father   . Diabetes Father   . Diabetes Brother    History  Substance Use Topics  . Smoking status: Former Smoker --  0.75 packs/day for 20 years    Types: Cigarettes    Start date: 03/20/1969    Quit date: 12/20/1988  . Smokeless tobacco: Never Used  . Alcohol Use: No    Review of Systems ROS: Statement: All systems negative except as marked or noted in the HPI; Constitutional: +fever and chills. ; ; Eyes: Negative for eye pain, redness and discharge. ; ; ENMT: Negative for ear pain, hoarseness, nasal congestion, sinus pressure and sore throat. ; ; Cardiovascular: Negative for chest pain, palpitations, diaphoresis, dyspnea and peripheral edema. ; ; Respiratory: Negative for cough, wheezing and stridor. ; ; Gastrointestinal: Negative for nausea, vomiting, blood in stool, hematemesis, jaundice and +constipation, rectal pain, rectal bleeding. . ; ; Genitourinary: Negative for dysuria, flank pain and hematuria. ; ; Musculoskeletal: Negative for back pain and neck pain. Negative for swelling and trauma.; ; Skin: Negative for pruritus, rash, abrasions,  blisters, bruising and skin lesion.; ; Neuro: Negative for headache, lightheadedness and neck stiffness. Negative for weakness, altered level of consciousness , altered mental status, extremity weakness, paresthesias, involuntary movement, seizure and syncope.     Allergies  Celebrex; Penicillins; and Actos  Home Medications   Prior to Admission medications   Medication Sig Start Date End Date Taking? Authorizing Provider  acetaminophen (TYLENOL) 325 MG tablet Take 650 mg by mouth every 6 (six) hours as needed for moderate pain.    Historical Provider, MD  albuterol (PROVENTIL HFA;VENTOLIN HFA) 108 (90 BASE) MCG/ACT inhaler Inhale 2 puffs into the lungs every 6 (six) hours as needed for wheezing. 10/19/12   Alycia Rossetti, MD  albuterol (PROVENTIL) (2.5 MG/3ML) 0.083% nebulizer solution Take 3 mLs (2.5 mg total) by nebulization every 6 (six) hours as needed for wheezing or shortness of breath. 10/30/13   Fayrene Helper, MD  allopurinol (ZYLOPRIM) 100 MG tablet Take 100 mg by mouth daily.     Historical Provider, MD  B Complex-C-Folic Acid (RENAL MULTIVITAMIN FORMULA PO) Take 1 tablet by mouth daily.    Historical Provider, MD  cetirizine (ZYRTEC) 10 MG tablet Take 10 mg by mouth daily as needed for allergies.    Historical Provider, MD  cinacalcet (SENSIPAR) 90 MG tablet Take 90 mg by mouth daily.    Historical Provider, MD  docusate sodium (COLACE) 100 MG capsule Take 200 mg by mouth 2 (two) times daily as needed for moderate constipation.     Historical Provider, MD  fish oil-omega-3 fatty acids 1000 MG capsule Take 2 g by mouth 3 (three) times daily. SUPER OMEGA 3 FORMULA: EPA 300/DHA20    Historical Provider, MD  gabapentin (NEURONTIN) 100 MG capsule Take 1 capsule (100 mg total) by mouth 2 (two) times daily. 01/29/14   Fayrene Helper, MD  gemfibrozil (LOPID) 600 MG tablet Take 600 mg by mouth 2 (two) times daily.    Historical Provider, MD  HYDROcodone-acetaminophen (NORCO/VICODIN)  5-325 MG per tablet Take 2 tablets by mouth every 4 (four) hours as needed. 03/13/15   Tanna Furry, MD  hydrocortisone (ANUSOL-HC) 25 MG suppository Place 1 suppository (25 mg total) rectally 2 (two) times daily. 03/13/15   Tanna Furry, MD  insulin glargine (LANTUS) 100 UNIT/ML injection Inject 22 Units into the skin at bedtime.     Historical Provider, MD  ipratropium (ATROVENT) 0.02 % nebulizer solution Take 2.5 mLs (0.5 mg total) by nebulization 4 (four) times daily. 10/30/13   Fayrene Helper, MD  magnesium citrate SOLN Take 148 mLs (0.5 Bottles total) by mouth  once. 1/2 bottle today. One half bottle tomorrow if still constipated 03/13/15   Tanna Furry, MD  pantoprazole (PROTONIX) 40 MG tablet Take 40 mg by mouth daily.    Historical Provider, MD  PRESCRIPTION MEDICATION Place 1 drop into both eyes 3 (three) times daily. For dry eyes    Historical Provider, MD  sevelamer (RENVELA) 800 MG tablet Take 2,400-4,800 mg by mouth See admin instructions. Takes 6 tablets (4800 mg) three times daily with meals and 3 tablets (2400 mg) with snack.    Historical Provider, MD   BP 139/48 mmHg  Pulse 108  Temp(Src) 101 F (38.3 C) (Oral)  Resp 18  Ht 6\' 2"  (1.88 m)  Wt 253 lb (114.76 kg)  BMI 32.47 kg/m2  SpO2 96% Physical Exam  1730: Physical examination:  Nursing notes reviewed; Vital signs and O2 SAT reviewed; +febrile.;; Constitutional: Well developed, Well nourished, Well hydrated, In no acute distress; Head:  Normocephalic, atraumatic; Eyes: EOMI, PERRL, No scleral icterus; ENMT: Mouth and pharynx normal, Mucous membranes moist; Neck: Supple, Full range of motion, No lymphadenopathy; Cardiovascular: Regular rate and rhythm, No gallop; Respiratory: Breath sounds clear & equal bilaterally, No wheezes.  Speaking full sentences with ease, Normal respiratory effort/excursion; Chest: Nontender, Movement normal; Abdomen: Soft, No tenderness to palp. Nondistended, Normal bowel sounds; Genitourinary: No CVA  tenderness; Extremities: Pulses normal, No tenderness, No edema, No calf edema or asymmetry.; Neuro: AA&Ox3, Major CN grossly intact.  Speech clear. No gross focal motor or sensory deficits in extremities.; Skin: Color normal, Warm, Dry.   ED Course  Procedures       EKG Interpretation None      MDM  MDM Reviewed: previous chart, nursing note and vitals Reviewed previous: labs Interpretation: labs and CT scan   Results for orders placed or performed during the hospital encounter of 03/17/15  Comprehensive metabolic panel  Result Value Ref Range   Sodium 136 135 - 145 mmol/L   Potassium 3.9 3.5 - 5.1 mmol/L   Chloride 96 96 - 112 mmol/L   CO2 27 19 - 32 mmol/L   Glucose, Bld 110 (H) 70 - 99 mg/dL   BUN 21 6 - 23 mg/dL   Creatinine, Ser 8.70 (H) 0.50 - 1.35 mg/dL   Calcium 8.6 8.4 - 10.5 mg/dL   Total Protein 7.0 6.0 - 8.3 g/dL   Albumin 3.1 (L) 3.5 - 5.2 g/dL   AST 34 0 - 37 U/L   ALT 32 0 - 53 U/L   Alkaline Phosphatase 72 39 - 117 U/L   Total Bilirubin 0.6 0.3 - 1.2 mg/dL   GFR calc non Af Amer 6 (L) >90 mL/min   GFR calc Af Amer 7 (L) >90 mL/min   Anion gap 13 5 - 15  CBC with Differential  Result Value Ref Range   WBC 12.4 (H) 4.0 - 10.5 K/uL   RBC 4.30 4.22 - 5.81 MIL/uL   Hemoglobin 13.6 13.0 - 17.0 g/dL   HCT 41.3 39.0 - 52.0 %   MCV 96.0 78.0 - 100.0 fL   MCH 31.6 26.0 - 34.0 pg   MCHC 32.9 30.0 - 36.0 g/dL   RDW 14.0 11.5 - 15.5 %   Platelets 176 150 - 400 K/uL   Neutrophils Relative % 82 (H) 43 - 77 %   Neutro Abs 10.2 (H) 1.7 - 7.7 K/uL   Lymphocytes Relative 8 (L) 12 - 46 %   Lymphs Abs 1.1 0.7 - 4.0 K/uL   Monocytes Relative  10 3 - 12 %   Monocytes Absolute 1.2 (H) 0.1 - 1.0 K/uL   Eosinophils Relative 0 0 - 5 %   Eosinophils Absolute 0.0 0.0 - 0.7 K/uL   Basophils Relative 0 0 - 1 %   Basophils Absolute 0.0 0.0 - 0.1 K/uL  Lactic acid, plasma  Result Value Ref Range   Lactic Acid, Venous 1.9 0.5 - 2.0 mmol/L  Lactic acid, plasma  Result  Value Ref Range   Lactic Acid, Venous 1.0 0.5 - 2.0 mmol/L   Ct Abdomen Pelvis Wo Contrast 03/17/2015   CLINICAL DATA:  Acute rectal pain, fever, diarrhea .  EXAM: CT ABDOMEN AND PELVIS WITHOUT CONTRAST  TECHNIQUE: Multidetector CT imaging of the abdomen and pelvis was performed following the standard protocol without IV contrast.  COMPARISON:  04/10/2012, 01/21/2012  FINDINGS: Lower chest: Minor scattered basilar atelectasis. Normal heart size. No pericardial or pleural effusion. Small hiatal hernia suspected. Degenerative changes of the lower thoracic spine.  Abdomen: Gallbladder is not visualized. Liver, biliary system, pancreas, and spleen within normal limits for age and noncontrast imaging.  Kidneys demonstrate extensive polycystic kidney disease. Several cysts demonstrate hyper attenuation compatible with protein or hemorrhage. Several cysts are also calcified. No renal obstruction or hydronephrosis. Stable mild thickening of the adrenal glands bilaterally, suspect hyperplasia.  Negative for bowel obstruction, dilatation, ileus, or free air. Postop changes of the abdominal wall. No ventral hernia evident.  Normal appendix in right lower quadrant.  No abdominal free fluid, fluid collection, hemorrhage, abscess, or adenopathy.  Aortic iliac atherosclerosis noted without aneurysm or retroperitoneal hemorrhage.  Pelvis: Colonic diverticulosis noted diffusely. No pelvic free fluid, fluid collection, hemorrhage, or adenopathy. Vascular calcifications noted. Small fat containing inguinal hernias bilaterally. In the rectal region, there is a left perirectal low-attenuation area extending into the gluteal soft tissues. This roughly measures 4.2 x 2.5 cm, image 15 series 3. This is ill-defined by noncontrast imaging but suspicious for a perirectal abscess.  Diffuse degenerative changes of the visualized spine, SI joints and hips. No acute osseous finding.  IMPRESSION: 4.2 x 5.2 cm right perirectal thick-walled  hypodense fluid collection by noncontrast imaging, suspect perirectal abscess.  Bilateral polycystic kidney disease.  Aortoiliac atherosclerosis without aneurysm  Colonic diverticulosis  Normal appendix  Fat containing inguinal hernias bilaterally   Electronically Signed   By: Jerilynn Mages.  Shick M.D.   On: 03/17/2015 20:56   Dg Chest 2 View 03/17/2015   CLINICAL DATA:  Acute fever, congestion cough  EXAM: CHEST  2 VIEW  COMPARISON:  07/15/2014  FINDINGS: Mild hyperinflation without focal pneumonia, collapse or consolidation. Negative for edema, effusion, or pneumothorax. Trachea midline. Normal heart size and vascularity. Degenerative changes of the spine diffusely. The trachea is midline. No acute osseous finding.  IMPRESSION: No acute chest process.   Electronically Signed   By: Jerilynn Mages.  Shick M.D.   On: 03/17/2015 20:23    2115:  Will dose IV cipro and flagyl for perirectal abscess. APAP given for fever with improvement. VS otherwise stable.  Dx and testing d/w pt.  Questions answered.  Verb understanding, agreeable to admit. T/C to General Surgeon Dr. Arnoldo Morale, case discussed, including:  HPI, pertinent PM/SHx, VS/PE, dx testing, ED course and treatment:  Agreeable to consult, requests to admit to medicine service. T/C to [facility] Dr. Marily Memos, case discussed, including:  HPI, pertinent PM/SHx, VS/PE, dx testing, ED course and treatment:  Agreeable to admit, requests to write temporary orders, obtain tele bed to team APAdmits.   Nunzio Cory  Thurnell Garbe, DO 03/19/15 1417

## 2015-03-17 NOTE — Patient Instructions (Addendum)
You need to go to the ED for further evaluation  WeI will contact the ED  To let them know you are on your way

## 2015-03-17 NOTE — ED Notes (Signed)
Pt states he does not make any urine.  Pt is a dialysis pt.

## 2015-03-17 NOTE — Progress Notes (Signed)
ANTIBIOTIC CONSULT NOTE  Pharmacy Consult for Cipro Indication: peri-rectal abscess  Allergies  Allergen Reactions  . Celebrex [Celecoxib] Other (See Comments)    Heart sped up  . Penicillins Other (See Comments)    Couldn't walk.   . Actos [Pioglitazone] Other (See Comments)    unknown   Patient Measurements: Height: 6\' 2"  (188 cm) Weight: 253 lb (114.76 kg) IBW/kg (Calculated) : 82.2  Vital Signs: Temp: 99.1 F (37.3 C) (03/28 2014) Temp Source: Oral (03/28 2014) BP: 133/45 mmHg (03/28 2014) Pulse Rate: 85 (03/28 2014) Intake/Output from previous day:   Intake/Output from this shift:    Labs:  Recent Labs  03/17/15 1735  WBC 12.4*  HGB 13.6  PLT 176  CREATININE 8.70*   Estimated Creatinine Clearance: 11.7 mL/min (by C-G formula based on Cr of 8.7). No results for input(s): VANCOTROUGH, VANCOPEAK, VANCORANDOM, GENTTROUGH, GENTPEAK, GENTRANDOM, TOBRATROUGH, TOBRAPEAK, TOBRARND, AMIKACINPEAK, AMIKACINTROU, AMIKACIN in the last 72 hours.   Microbiology: No results found for this or any previous visit (from the past 720 hour(s)).  Anti-infectives    Start     Dose/Rate Route Frequency Ordered Stop   03/17/15 2130  ciprofloxacin (CIPRO) IVPB 400 mg     400 mg 200 mL/hr over 60 Minutes Intravenous  Once 03/17/15 2118     03/17/15 2115  metroNIDAZOLE (FLAGYL) IVPB 500 mg     500 mg 100 mL/hr over 60 Minutes Intravenous  Once 03/17/15 2111        Assessment: 64 yo M who presented with fever (Tm 102F) and rectal pain.  CT + rectal abscess.   WBC is elevated.  Lactic acid level trending down (1.9-->1.0). ESRD requiring HD on MWF.  Cipro 3/28>> Flagyl 3/28>>  Goal of Therapy:  Eradicate infection.  Plan:  Cipro 400mg  IV q24h Change to PO once clinically appropriate Monitor cx data & patient progress  Albert Lewis 03/17/2015,9:20 PM

## 2015-03-17 NOTE — ED Notes (Signed)
Pt reports sent here from dr. Griffin Dakin office. Pt reports was given two tylenol prior to leaving pcp office. Pt reports weakness,fever for last several days. Pt reports seen for same on Friday. nad noted.

## 2015-03-18 ENCOUNTER — Encounter (HOSPITAL_COMMUNITY): Payer: Self-pay | Admitting: *Deleted

## 2015-03-18 ENCOUNTER — Inpatient Hospital Stay (HOSPITAL_COMMUNITY): Payer: Medicare Other | Admitting: Anesthesiology

## 2015-03-18 ENCOUNTER — Encounter (HOSPITAL_COMMUNITY)
Admission: EM | Disposition: A | Discharge status: Home / Self Care | Payer: Self-pay | Source: Home / Self Care | Attending: Family Medicine

## 2015-03-18 ENCOUNTER — Inpatient Hospital Stay (HOSPITAL_COMMUNITY): Payer: Medicare Other

## 2015-03-18 DIAGNOSIS — J9602 Acute respiratory failure with hypercapnia: Secondary | ICD-10-CM

## 2015-03-18 HISTORY — PX: INCISION AND DRAINAGE ABSCESS: SHX5864

## 2015-03-18 LAB — COMPREHENSIVE METABOLIC PANEL
ALK PHOS: 69 U/L (ref 39–117)
ALT: 25 U/L (ref 0–53)
AST: 44 U/L — ABNORMAL HIGH (ref 0–37)
Albumin: 2.9 g/dL — ABNORMAL LOW (ref 3.5–5.2)
Anion gap: 14 (ref 5–15)
BILIRUBIN TOTAL: 0.6 mg/dL (ref 0.3–1.2)
BUN: 25 mg/dL — ABNORMAL HIGH (ref 6–23)
CALCIUM: 8.5 mg/dL (ref 8.4–10.5)
CO2: 28 mmol/L (ref 19–32)
Chloride: 95 mmol/L — ABNORMAL LOW (ref 96–112)
Creatinine, Ser: 9.81 mg/dL — ABNORMAL HIGH (ref 0.50–1.35)
GFR calc Af Amer: 6 mL/min — ABNORMAL LOW (ref >90)
GFR, EST NON AFRICAN AMERICAN: 5 mL/min — AB (ref >90)
Glucose, Bld: 70 mg/dL (ref 70–99)
POTASSIUM: 4 mmol/L (ref 3.5–5.1)
Sodium: 137 mmol/L (ref 135–145)
Total Protein: 6.4 g/dL (ref 6.0–8.3)

## 2015-03-18 LAB — BLOOD GAS, ARTERIAL
ACID-BASE DEFICIT: 1 mmol/L (ref 0.0–2.0)
BICARBONATE: 24.4 meq/L — AB (ref 20.0–24.0)
FIO2: 40 %
MECHVT: 650 mL
O2 Saturation: 92.1 %
PEEP: 5 cmH2O
PH ART: 7.317 — AB (ref 7.350–7.450)
Patient temperature: 37
RATE: 15 resp/min
TCO2: 22 mmol/L (ref 0–100)
pCO2 arterial: 49.1 mmHg — ABNORMAL HIGH (ref 35.0–45.0)
pO2, Arterial: 73 mmHg — ABNORMAL LOW (ref 80.0–100.0)

## 2015-03-18 LAB — APTT: aPTT: 37 seconds (ref 24–37)

## 2015-03-18 LAB — PROTIME-INR
INR: 1.21 (ref 0.00–1.49)
Prothrombin Time: 15.4 seconds — ABNORMAL HIGH (ref 11.6–15.2)

## 2015-03-18 LAB — GLUCOSE, CAPILLARY
GLUCOSE-CAPILLARY: 82 mg/dL (ref 70–99)
Glucose-Capillary: 86 mg/dL (ref 70–99)
Glucose-Capillary: 78 mg/dL (ref 70–99)
Glucose-Capillary: 72 mg/dL (ref 70–99)
Glucose-Capillary: 126 mg/dL — ABNORMAL HIGH (ref 70–99)

## 2015-03-18 LAB — CBC
HCT: 37.9 % — ABNORMAL LOW (ref 39.0–52.0)
Hemoglobin: 12.2 g/dL — ABNORMAL LOW (ref 13.0–17.0)
MCH: 30.8 pg (ref 26.0–34.0)
MCHC: 32.2 g/dL (ref 30.0–36.0)
MCV: 95.7 fL (ref 78.0–100.0)
PLATELETS: 170 10*3/uL (ref 150–400)
RBC: 3.96 MIL/uL — ABNORMAL LOW (ref 4.22–5.81)
RDW: 13.9 % (ref 11.5–15.5)
WBC: 10.4 10*3/uL (ref 4.0–10.5)

## 2015-03-18 LAB — SURGICAL PCR SCREEN
MRSA, PCR: NEGATIVE
Staphylococcus aureus: NEGATIVE

## 2015-03-18 SURGERY — INCISION AND DRAINAGE, ABSCESS
Anesthesia: General | Site: Buttocks

## 2015-03-18 MED ORDER — EPHEDRINE SULFATE 50 MG/ML IJ SOLN
INTRAMUSCULAR | Status: AC
  Filled 2015-03-18: qty 1

## 2015-03-18 MED ORDER — ONDANSETRON HCL 4 MG/2ML IJ SOLN: 4.0000 mg | Freq: Once | INTRAMUSCULAR | Status: DC | PRN

## 2015-03-18 MED ORDER — MIDAZOLAM HCL 2 MG/2ML IJ SOLN
INTRAMUSCULAR | Status: AC
  Filled 2015-03-18: qty 2

## 2015-03-18 MED ORDER — PROPOFOL 10 MG/ML IV BOLUS
INTRAVENOUS | Status: AC
  Filled 2015-03-18: qty 20

## 2015-03-18 MED ORDER — EPHEDRINE SULFATE 50 MG/ML IJ SOLN
INTRAMUSCULAR | Status: DC | PRN
  Administered 2015-03-18: 10 mg via INTRAVENOUS

## 2015-03-18 MED ORDER — SODIUM CHLORIDE 0.9 % IR SOLN
Status: DC | PRN
  Administered 2015-03-18: 500 mL

## 2015-03-18 MED ORDER — FENTANYL CITRATE 0.05 MG/ML IJ SOLN
INTRAMUSCULAR | Status: AC
  Filled 2015-03-18: qty 5

## 2015-03-18 MED ORDER — FENTANYL CITRATE 0.05 MG/ML IJ SOLN
INTRAMUSCULAR | Status: DC | PRN
  Administered 2015-03-18 (×3): 50 ug via INTRAVENOUS

## 2015-03-18 MED ORDER — ONDANSETRON HCL 4 MG/2ML IJ SOLN
INTRAMUSCULAR | Status: AC
  Filled 2015-03-18: qty 2

## 2015-03-18 MED ORDER — SODIUM CHLORIDE 0.9 % IJ SOLN
INTRAMUSCULAR | Status: AC
  Filled 2015-03-18: qty 10

## 2015-03-18 MED ORDER — SUCCINYLCHOLINE CHLORIDE 20 MG/ML IJ SOLN
INTRAMUSCULAR | Status: DC | PRN
  Administered 2015-03-18: 150 mg via INTRAVENOUS
  Administered 2015-03-18: 60 mg via INTRAVENOUS

## 2015-03-18 MED ORDER — CETYLPYRIDINIUM CHLORIDE 0.05 % MT LIQD
7.0000 mL | Freq: Four times a day (QID) | OROMUCOSAL | Status: DC
  Administered 2015-03-18 – 2015-03-20 (×3): 7 mL via OROMUCOSAL

## 2015-03-18 MED ORDER — CHLORHEXIDINE GLUCONATE 0.12 % MT SOLN
15.0000 mL | Freq: Two times a day (BID) | OROMUCOSAL | Status: DC
  Administered 2015-03-18 – 2015-03-19 (×3): 15 mL via OROMUCOSAL
  Filled 2015-03-18 (×5): qty 15

## 2015-03-18 MED ORDER — GLYCOPYRROLATE 0.2 MG/ML IJ SOLN
INTRAMUSCULAR | Status: AC
  Filled 2015-03-18: qty 2

## 2015-03-18 MED ORDER — NEOSTIGMINE METHYLSULFATE 10 MG/10ML IV SOLN
INTRAVENOUS | Status: DC | PRN
  Administered 2015-03-18: 2 mg via INTRAVENOUS
  Administered 2015-03-18: 3 mg via INTRAVENOUS
  Administered 2015-03-18 (×2): 2 mg via INTRAVENOUS

## 2015-03-18 MED ORDER — DEXTROSE 50 % IV SOLN
INTRAVENOUS | Status: AC
  Filled 2015-03-18: qty 50

## 2015-03-18 MED ORDER — FENTANYL CITRATE 0.05 MG/ML IJ SOLN
100.0000 ug | INTRAMUSCULAR | Status: DC | PRN
  Administered 2015-03-18: 100 ug via INTRAVENOUS
  Filled 2015-03-18 (×3): qty 2

## 2015-03-18 MED ORDER — GLYCOPYRROLATE 0.2 MG/ML IJ SOLN
INTRAMUSCULAR | Status: DC | PRN
  Administered 2015-03-18: 0.4 mg via INTRAVENOUS

## 2015-03-18 MED ORDER — ARTIFICIAL TEARS OP OINT
TOPICAL_OINTMENT | OPHTHALMIC | Status: AC
  Filled 2015-03-18: qty 3.5

## 2015-03-18 MED ORDER — ROCURONIUM BROMIDE 50 MG/5ML IV SOLN
INTRAVENOUS | Status: AC
  Filled 2015-03-18: qty 1

## 2015-03-18 MED ORDER — SUCCINYLCHOLINE CHLORIDE 20 MG/ML IJ SOLN
INTRAMUSCULAR | Status: AC
  Filled 2015-03-18: qty 1

## 2015-03-18 MED ORDER — PROPOFOL 10 MG/ML IV BOLUS
INTRAVENOUS | Status: DC | PRN
  Administered 2015-03-18: 50 mg via INTRAVENOUS
  Administered 2015-03-18: 120 mg via INTRAVENOUS

## 2015-03-18 MED ORDER — FENTANYL CITRATE 0.05 MG/ML IJ SOLN: 25.0000 ug | INTRAMUSCULAR | Status: DC | PRN

## 2015-03-18 MED ORDER — DEXTROSE 50 % IV SOLN
12.5000 g | Freq: Once | INTRAVENOUS | Status: AC
  Administered 2015-03-18: 12.5 g via INTRAVENOUS

## 2015-03-18 MED ORDER — ROCURONIUM BROMIDE 100 MG/10ML IV SOLN
INTRAVENOUS | Status: DC | PRN
  Administered 2015-03-18: 20 mg via INTRAVENOUS

## 2015-03-18 MED ORDER — NALOXONE HCL 0.4 MG/ML IJ SOLN: 0.4000 mg | INTRAMUSCULAR | Status: DC | PRN

## 2015-03-18 MED ORDER — NALOXONE HCL 0.4 MG/ML IJ SOLN
INTRAMUSCULAR | Status: AC
  Filled 2015-03-18: qty 1

## 2015-03-18 MED ORDER — MIDAZOLAM HCL 5 MG/5ML IJ SOLN
INTRAMUSCULAR | Status: DC | PRN
  Administered 2015-03-18: 2 mg via INTRAVENOUS

## 2015-03-18 MED ORDER — ONDANSETRON HCL 4 MG/2ML IJ SOLN
4.0000 mg | Freq: Once | INTRAMUSCULAR | Status: AC
  Administered 2015-03-18: 4 mg via INTRAVENOUS

## 2015-03-18 MED ORDER — METOPROLOL TARTRATE 1 MG/ML IV SOLN
INTRAVENOUS | Status: DC | PRN
  Administered 2015-03-18 (×2): 1 mg via INTRAVENOUS

## 2015-03-18 MED ORDER — METOPROLOL TARTRATE 1 MG/ML IV SOLN
INTRAVENOUS | Status: AC
  Filled 2015-03-18: qty 5

## 2015-03-18 MED ORDER — MIDAZOLAM HCL 2 MG/2ML IJ SOLN
1.0000 mg | INTRAMUSCULAR | Status: DC | PRN
  Administered 2015-03-18: 2 mg via INTRAVENOUS

## 2015-03-18 MED ORDER — NEOSTIGMINE METHYLSULFATE 10 MG/10ML IV SOLN
INTRAVENOUS | Status: AC
Start: 2015-03-18 — End: 2015-03-18
  Filled 2015-03-18: qty 1

## 2015-03-18 MED ORDER — DEXTROSE 50 % IV SOLN
25.0000 mL | Freq: Once | INTRAVENOUS | Status: AC
  Administered 2015-03-18: 25 mL via INTRAVENOUS

## 2015-03-18 MED ORDER — FENTANYL CITRATE 0.05 MG/ML IJ SOLN
100.0000 ug | INTRAMUSCULAR | Status: DC | PRN
  Administered 2015-03-19 (×2): 100 ug via INTRAVENOUS
  Filled 2015-03-18: qty 2

## 2015-03-18 SURGICAL SUPPLY — 29 items
BAG HAMPER (MISCELLANEOUS) ×3 IMPLANT
BNDG CONFORM 2 STRL LF (GAUZE/BANDAGES/DRESSINGS) ×1 IMPLANT
CLOTH BEACON ORANGE TIMEOUT ST (SAFETY) ×3 IMPLANT
COVER LIGHT HANDLE STERIS (MISCELLANEOUS) ×6 IMPLANT
ELECT REM PT RETURN 9FT ADLT (ELECTROSURGICAL) ×3
ELECTRODE REM PT RTRN 9FT ADLT (ELECTROSURGICAL) ×1 IMPLANT
GAUZE PACKING 2X5 YD STRL (GAUZE/BANDAGES/DRESSINGS) ×2 IMPLANT
GAUZE SPONGE 4X4 12PLY STRL (GAUZE/BANDAGES/DRESSINGS) IMPLANT
GLOVE BIOGEL M STRL SZ7.5 (GLOVE) ×3 IMPLANT
GLOVE INDICATOR 7.0 STRL GRN (GLOVE) ×2 IMPLANT
GLOVE SS BIOGEL STRL SZ 6.5 (GLOVE) IMPLANT
GLOVE SUPERSENSE BIOGEL SZ 6.5 (GLOVE) ×2
GLOVE SURG SS PI 7.5 STRL IVOR (GLOVE) ×3 IMPLANT
GOWN STRL REUS W/TWL LRG LVL3 (GOWN DISPOSABLE) ×6 IMPLANT
KIT ROOM TURNOVER APOR (KITS) ×3 IMPLANT
MANIFOLD NEPTUNE II (INSTRUMENTS) ×3 IMPLANT
MARKER SKIN DUAL TIP RULER LAB (MISCELLANEOUS) ×3 IMPLANT
NS IRRIG 1000ML POUR BTL (IV SOLUTION) ×3 IMPLANT
PACK BASIC LIMB (CUSTOM PROCEDURE TRAY) IMPLANT
PACK MINOR (CUSTOM PROCEDURE TRAY) ×1 IMPLANT
PACK PERI GYN (CUSTOM PROCEDURE TRAY) ×2 IMPLANT
PAD ABD 5X9 TENDERSORB (GAUZE/BANDAGES/DRESSINGS) IMPLANT
PAD ARMBOARD 7.5X6 YLW CONV (MISCELLANEOUS) ×3 IMPLANT
SET BASIN LINEN APH (SET/KITS/TRAYS/PACK) ×3 IMPLANT
SPONGE GAUZE 4X4 12PLY (GAUZE/BANDAGES/DRESSINGS) ×2 IMPLANT
SWAB COLLECTION DEVICE MRSA (MISCELLANEOUS) ×2 IMPLANT
SWAB CULTURE LIQ STUART DBL (MISCELLANEOUS) ×2 IMPLANT
SYR BULB IRRIGATION 50ML (SYRINGE) ×3 IMPLANT
TUBE ANAEROBIC PORT A CUL  W/M (MISCELLANEOUS) ×2 IMPLANT

## 2015-03-18 NOTE — ED Provider Notes (Signed)
ED ECG REPORT   Date: 03/18/2015  Rate: 85  Rhythm: normal sinus rhythm  QRS Axis: normal  Intervals: PR prolonged  ST/T Wave abnormalities: normal  Conduction Disutrbances:none  Narrative Interpretation:   Old EKG Reviewed: none available  I have personally reviewed the EKG tracing and agree with the computerized printout as noted.  Orlie Dakin, MD 03/18/15 [phone]

## 2015-03-18 NOTE — Op Note (Signed)
Patient:  Albert Lewis  DOB:  [birth year]  MRN:  [id number]   Preop Diagnosis:  Perianal abscess  Postop Diagnosis:  Same  Procedure:  Incision and drainage of perianal abscess  Surgeon:  Aviva Signs, M.D.  Anes:  Gen.  Indications:  Patient is a 64 year old black male multiple medical problems including end-stage renal disease and diabetes mellitus who presents with a several day history of worsening perirectal pain. He was noted to have fevers while getting dialyzed yesterday. CT scan of the abdomen and pelvis revealed a 4-5 cm perianal abscess along the right side. He now presents for incision and drainage of the perianal abscess. The risks and benefits of the procedure were fully explained to the patient, who gave informed consent.   Procedure note:  The patient was placed in the lithotomy position after general anesthesia was administered. The perineum was prepped and draped using the usual sterile technique with Betadine. Surgical site confirmation was performed.  At the 10:00 position at the anal verge, the fluctuant mass was already started to draining. This site was digitally probed and more purulent fluid was evacuated. Aerobic and anaerobic cultures were taken and sent to microbiology. The abscess cavity was subcutaneous in nature and did not go deep to the muscular layer. The abscess cavity was irrigated with normal saline. Iodoform impregnated rectal packing was then placed.  All tape and needle counts were correct the end of the procedure. The patient was awakened and transferred to PACU in stable condition.  Complications:  None  EBL:  Minimal  Specimen:  Cultures of perianal abscess

## 2015-03-18 NOTE — Transfer of Care (Signed)
Immediate Anesthesia Transfer of Care Note  Patient: Albert Lewis  Procedure(s) Performed: Procedure(s): INCISION AND DRAINAGE ABSCESS (N/A)  Patient Location: ICU  Anesthesia Type:General  Level of Consciousness: sedated and obtunded  Airway & Oxygen Therapy: Patient re-intubated and Patient placed on Ventilator (see vital sign flow sheet for setting)  Post-op Assessment: Report given to RN and Post -op Vital signs reviewed and stable  Post vital signs: Reviewed and stable  Last Vitals:  Filed Vitals:   03/18/15 1415  BP: 138/95  Pulse: 124  Temp:   Resp: 16    Complications: Patient re-intubated

## 2015-03-18 NOTE — Care Management Note (Addendum)
    Page 1 of 1   03/20/2015     10:12:08 AM CARE MANAGEMENT NOTE 03/20/2015  Patient:  Albert Lewis, Albert Lewis   Account Number:  [id number]  Date Initiated:  03/18/2015  Documentation initiated by:  Jolene Provost  Subjective/Objective Assessment:   Pt is from home, lives in senior housing. Pt independnet at baseline. Pt has rollator for PRN use. Pt goes to HD M/W/F. Pt is active with [facility], PCP Dr. Moshe Cipro in [place].     Action/Plan:   Pt plans to discharge home with self care. CM will notifiy [facility] of admission and fax signed refusal to transfer form. Will cont to follow.   Anticipated DC Date:  03/21/2015   Anticipated DC Plan:  [place]  CM consult      Choice offered to / List presented to:             Status of service:  Completed, signed off Medicare Important Message given?  YES (If response is "NO", the following Medicare IM given date fields will be blank) Date Medicare IM given:  03/20/2015 Medicare IM given by:  Jolene Provost Date Additional Medicare IM given:   Additional Medicare IM given by:    Discharge Disposition:  HOME/SELF CARE  Per UR Regulation:  Reviewed for med. necessity/level of care/duration of stay  If discussed at [place] of Stay Meetings, dates discussed:    Comments:  03/20/2015 [place], RN, MSN, CM Pt discharging home today with self care. Will fax D/C summary to [facility] per their request.  03/19/2015 [place], RN, MSN, CM Rosanne Sack, transfer coordinator, at the [facility] contacted and notified of admission and status. Delay in sending information r/t transfer to increased level of care and complications from surgery. Pt has now signed refusal to transfer and it along with H&P have been faxed to [facility].  03/18/2015 [place], RN, MSN, CM

## 2015-03-18 NOTE — Progress Notes (Signed)
Patient is having a difficult time ventilating in the PACU. His pupils are pinpoint and he has not been fully reversed from his muscle relaxant and narcotic given during surgery in spite of reversal agents given, thus the patient was reintubated by anesthesia. His blood pressure has remained stable. He'll be transferred to the intensive care unit for ventilatory dependent respiratory failure. I have discussed this with the hospitalist. He should remain intubated until after being dialyzed in a.m. No family could be found.

## 2015-03-18 NOTE — Progress Notes (Signed)
Patient came to pacu with respiratory issues  And anesthesia was on hand. Anesthesia handling issue. Clearer documentation on events in anesthsia  Epic.

## 2015-03-18 NOTE — Consult Note (Signed)
Reason for Consult: Perianal pain Referring Physician: Hospitalist  Albert Lewis is an 64 y.o. male.  HPI: Patient is a 64 year old black male who during dialysis yesterday had an episode of fevers. He was seen by his primary care physician and sent to the emergency room due to increasing perianal pain. He apparently saw an ER physician and another facility several days ago and was told he had hemorrhoidal disease. At [facility], a CT scan of the abdomen and pelvis revealed a perirectal abscess. He was noted have a mild leukocytosis. He was admitted to the hospital for further evaluation treatment.  Past Medical History  Diagnosis Date  . Paroxysmal atrial fibrillation 2/09    Abnormal stress nuclear study in 01/2008; normal coronary angiography  . Hyperlipidemia 2008  . Hypertension 1993    Mild to moderate LVH  . Obesity     sleep apnea treated with BiPAP  . Anemia   . Gastroesophageal reflux disease   . Hyperparathyroidism     Secondary  . Allergic rhinitis   . Lower gastrointestinal bleed 04/2008    diverticulosis; hemorrhoids; constipation  . Diverticular disease   . Chronic back pain     Degenerative disc disease  . ESRD (end stage renal disease) on dialysis     Dialysis since 2007  . Renal insufficiency   . Sleep apnea 2010    CPAP followed at [place]  . Cataract     s/p extraction x 2  . Diabetes mellitus, type 2 1993    no insulin  . Atrial flutter   . Dialysis patient     Past Surgical History  Procedure Laterality Date  . Cataract extraction w/ intraocular lens  implant, bilateral  2008  . Lumbar peritoneal shunt    . Exploratory laparotomy      shunt complications  . Back surgery    . Cholecystectomy  1999  . Spine surgery      twice in lower back  . Atrial flutter ablation  06/18/14, 08/22/14    CTI ablation x 2 by Dr Rayann Heman  . Atrial flutter ablation N/A 06/18/2014    Procedure: ATRIAL FLUTTER ABLATION;  Surgeon: Coralyn Celia Gibbons, MD;  Location: [facility] CATH LAB;   Service: Cardiovascular;  Laterality: N/A;  . Atrial flutter ablation N/A 08/22/2014    Procedure: ATRIAL FLUTTER ABLATION;  Surgeon: Coralyn Jazmene Racz, MD;  Location: [facility] CATH LAB;  Service: Cardiovascular;  Laterality: N/A;    Family History  Problem Relation Age of Onset  . Hypertension Mother   . Cancer Mother 62    breast   . Heart disease Mother   . Hyperlipidemia Mother   . Diabetes Mother   . Hypertension Sister 62  . Heart disease Sister   . Hyperlipidemia Sister   . Diabetes Sister   . Hypertension Daughter 35  . Diabetes Daughter   . Heart disease Daughter   . Heart disease Father   . Hyperlipidemia Father   . Hypertension Father   . Diabetes Father   . Diabetes Brother     Social History:  reports that he quit smoking about 26 years ago. His smoking use included Cigarettes. He started smoking about 46 years ago. He has a 15 pack-year smoking history. He has never used smokeless tobacco. He reports that he does not drink alcohol or use illicit drugs.  Allergies:  Allergies  Allergen Reactions  . Celebrex [Celecoxib] Other (See Comments)    Heart sped up  . Penicillins Other (  See Comments)    Couldn't walk.   . Actos [Pioglitazone] Other (See Comments)    unknown    Medications: I have reviewed the patient's current medications.  Results for orders placed or performed during the hospital encounter of 03/17/15 (from the past 48 hour(s))  Comprehensive metabolic panel     Status: Abnormal   Collection Time: 03/17/15  5:35 PM  Result Value Ref Range   Sodium 136 135 - 145 mmol/L   Potassium 3.9 3.5 - 5.1 mmol/L   Chloride 96 96 - 112 mmol/L   CO2 27 19 - 32 mmol/L   Glucose, Bld 110 (H) 70 - 99 mg/dL   BUN 21 6 - 23 mg/dL   Creatinine, Ser 8.70 (H) 0.50 - 1.35 mg/dL   Calcium 8.6 8.4 - 10.5 mg/dL   Total Protein 7.0 6.0 - 8.3 g/dL   Albumin 3.1 (L) 3.5 - 5.2 g/dL   AST 34 0 - 37 U/L   ALT 32 0 - 53 U/L   Alkaline Phosphatase 72 39 - 117 U/L   Total  Bilirubin 0.6 0.3 - 1.2 mg/dL   GFR calc non Af Amer 6 (L) >90 mL/min   GFR calc Af Amer 7 (L) >90 mL/min    Comment: (NOTE) The eGFR has been calculated using the CKD EPI equation. This calculation has not been validated in all clinical situations. eGFR's persistently <90 mL/min signify possible Chronic Kidney Disease.    Anion gap 13 5 - 15  CBC with Differential     Status: Abnormal   Collection Time: 03/17/15  5:35 PM  Result Value Ref Range   WBC 12.4 (H) 4.0 - 10.5 K/uL   RBC 4.30 4.22 - 5.81 MIL/uL   Hemoglobin 13.6 13.0 - 17.0 g/dL   HCT 41.3 39.0 - 52.0 %   MCV 96.0 78.0 - 100.0 fL   MCH 31.6 26.0 - 34.0 pg   MCHC 32.9 30.0 - 36.0 g/dL   RDW 14.0 11.5 - 15.5 %   Platelets 176 150 - 400 K/uL   Neutrophils Relative % 82 (H) 43 - 77 %   Neutro Abs 10.2 (H) 1.7 - 7.7 K/uL   Lymphocytes Relative 8 (L) 12 - 46 %   Lymphs Abs 1.1 0.7 - 4.0 K/uL   Monocytes Relative 10 3 - 12 %   Monocytes Absolute 1.2 (H) 0.1 - 1.0 K/uL   Eosinophils Relative 0 0 - 5 %   Eosinophils Absolute 0.0 0.0 - 0.7 K/uL   Basophils Relative 0 0 - 1 %   Basophils Absolute 0.0 0.0 - 0.1 K/uL  Lactic acid, plasma     Status: None   Collection Time: 03/17/15  5:35 PM  Result Value Ref Range   Lactic Acid, Venous 1.9 0.5 - 2.0 mmol/L  Culture, blood (routine x 2)     Status: None (Preliminary result)   Collection Time: 03/17/15  5:36 PM  Result Value Ref Range   Specimen Description BLOOD RIGHT HAND    Special Requests BOTTLES DRAWN AEROBIC AND ANAEROBIC 6CC    Culture PENDING    Report Status PENDING   Lactic acid, plasma     Status: None   Collection Time: 03/17/15  8:18 PM  Result Value Ref Range   Lactic Acid, Venous 1.0 0.5 - 2.0 mmol/L  Culture, blood (routine x 2)     Status: None (Preliminary result)   Collection Time: 03/17/15  9:45 PM  Result Value Ref Range   Specimen  Description BLOOD RIGHT HAND    Special Requests BOTTLES DRAWN AEROBIC AND ANAEROBIC 6CC    Culture PENDING     Report Status PENDING   Glucose, capillary     Status: None   Collection Time: 03/18/15  1:05 AM  Result Value Ref Range   Glucose-Capillary 86 70 - 99 mg/dL   Comment 1 Notify RN   APTT     Status: None   Collection Time: 03/18/15  5:52 AM  Result Value Ref Range   aPTT 37 24 - 37 seconds    Comment:        IF BASELINE aPTT IS ELEVATED, SUGGEST PATIENT RISK ASSESSMENT BE USED TO DETERMINE APPROPRIATE ANTICOAGULANT THERAPY.   Protime-INR     Status: Abnormal   Collection Time: 03/18/15  5:52 AM  Result Value Ref Range   Prothrombin Time 15.4 (H) 11.6 - 15.2 seconds   INR 1.21 0.00 - 1.49  CBC     Status: Abnormal   Collection Time: 03/18/15  5:52 AM  Result Value Ref Range   WBC 10.4 4.0 - 10.5 K/uL   RBC 3.96 (L) 4.22 - 5.81 MIL/uL   Hemoglobin 12.2 (L) 13.0 - 17.0 g/dL   HCT 37.9 (L) 39.0 - 52.0 %   MCV 95.7 78.0 - 100.0 fL   MCH 30.8 26.0 - 34.0 pg   MCHC 32.2 30.0 - 36.0 g/dL   RDW 13.9 11.5 - 15.5 %   Platelets 170 150 - 400 K/uL  Comprehensive metabolic panel     Status: Abnormal   Collection Time: 03/18/15  5:52 AM  Result Value Ref Range   Sodium 137 135 - 145 mmol/L   Potassium 4.0 3.5 - 5.1 mmol/L   Chloride 95 (L) 96 - 112 mmol/L   CO2 28 19 - 32 mmol/L   Glucose, Bld 70 70 - 99 mg/dL   BUN 25 (H) 6 - 23 mg/dL   Creatinine, Ser 9.81 (H) 0.50 - 1.35 mg/dL   Calcium 8.5 8.4 - 10.5 mg/dL   Total Protein 6.4 6.0 - 8.3 g/dL   Albumin 2.9 (L) 3.5 - 5.2 g/dL   AST 44 (H) 0 - 37 U/L   ALT 25 0 - 53 U/L   Alkaline Phosphatase 69 39 - 117 U/L   Total Bilirubin 0.6 0.3 - 1.2 mg/dL   GFR calc non Af Amer 5 (L) >90 mL/min   GFR calc Af Amer 6 (L) >90 mL/min    Comment: (NOTE) The eGFR has been calculated using the CKD EPI equation. This calculation has not been validated in all clinical situations. eGFR's persistently <90 mL/min signify possible Chronic Kidney Disease.    Anion gap 14 5 - 15  Glucose, capillary     Status: None   Collection Time: 03/18/15   7:51 AM  Result Value Ref Range   Glucose-Capillary 78 70 - 99 mg/dL    Ct Abdomen Pelvis Wo Contrast  03/17/2015   CLINICAL DATA:  Acute rectal pain, fever, diarrhea .  EXAM: CT ABDOMEN AND PELVIS WITHOUT CONTRAST  TECHNIQUE: Multidetector CT imaging of the abdomen and pelvis was performed following the standard protocol without IV contrast.  COMPARISON:  04/10/2012, 01/21/2012  FINDINGS: Lower chest: Minor scattered basilar atelectasis. Normal heart size. No pericardial or pleural effusion. Small hiatal hernia suspected. Degenerative changes of the lower thoracic spine.  Abdomen: Gallbladder is not visualized. Liver, biliary system, pancreas, and spleen within normal limits for age and noncontrast imaging.  Kidneys demonstrate extensive  polycystic kidney disease. Several cysts demonstrate hyper attenuation compatible with protein or hemorrhage. Several cysts are also calcified. No renal obstruction or hydronephrosis. Stable mild thickening of the adrenal glands bilaterally, suspect hyperplasia.  Negative for bowel obstruction, dilatation, ileus, or free air. Postop changes of the abdominal wall. No ventral hernia evident.  Normal appendix in right lower quadrant.  No abdominal free fluid, fluid collection, hemorrhage, abscess, or adenopathy.  Aortic iliac atherosclerosis noted without aneurysm or retroperitoneal hemorrhage.  Pelvis: Colonic diverticulosis noted diffusely. No pelvic free fluid, fluid collection, hemorrhage, or adenopathy. Vascular calcifications noted. Small fat containing inguinal hernias bilaterally. In the rectal region, there is a left perirectal low-attenuation area extending into the gluteal soft tissues. This roughly measures 4.2 x 2.5 cm, image 15 series 3. This is ill-defined by noncontrast imaging but suspicious for a perirectal abscess.  Diffuse degenerative changes of the visualized spine, SI joints and hips. No acute osseous finding.  IMPRESSION: 4.2 x 5.2 cm right perirectal  thick-walled hypodense fluid collection by noncontrast imaging, suspect perirectal abscess.  Bilateral polycystic kidney disease.  Aortoiliac atherosclerosis without aneurysm  Colonic diverticulosis  Normal appendix  Fat containing inguinal hernias bilaterally   Electronically Signed   By: Jerilynn Mages.  Shick M.D.   On: 03/17/2015 20:56   Dg Chest 2 View  03/17/2015   CLINICAL DATA:  Acute fever, congestion cough  EXAM: CHEST  2 VIEW  COMPARISON:  07/15/2014  FINDINGS: Mild hyperinflation without focal pneumonia, collapse or consolidation. Negative for edema, effusion, or pneumothorax. Trachea midline. Normal heart size and vascularity. Degenerative changes of the spine diffusely. The trachea is midline. No acute osseous finding.  IMPRESSION: No acute chest process.   Electronically Signed   By: Jerilynn Mages.  Shick M.D.   On: 03/17/2015 20:23    ROS: See chart Blood pressure 141/56, pulse 94, temperature 98 F (36.7 C), temperature source Oral, resp. rate 18, height 6' 2"  (1.88 m), weight 114.76 kg (253 lb), SpO2 94 %. Physical Exam: Pleasant black male lying on his left side in no acute distress. Rectal examination reveals a fluctuant mass in the right perianal region. Examination was limited secondary to pain.  Assessment/Plan: Impression: Perirectal abscess Plan: Patient be taken to the operating room today for incision and drainage of a perirectal abscess. The risks and benefits of the procedure were fully explained to the patient, who gave informed consent.  Berlyn Malina A 03/18/2015, 9:02 AM

## 2015-03-18 NOTE — Progress Notes (Signed)
Consent signed for surgery. No distress noted. Bath given by Nurse Tech. Patient taken to OR via stretcher.

## 2015-03-18 NOTE — Anesthesia Postprocedure Evaluation (Signed)
  Anesthesia Post-op Note  Patient: Albert Lewis  Procedure(s) Performed: Procedure(s): INCISION AND DRAINAGE ABSCESS (N/A)  Patient Location: ICU  Anesthesia Type:General  Level of Consciousness: obtunded and Patient remains intubated per anesthesia plan  Airway and Oxygen Therapy: Patient placed on Ventilator (see vital sign flow sheet for setting)  Post-op Pain: none  Post-op Assessment: Post-op Vital signs reviewed, Patient's Cardiovascular Status Stable and Respiratory Function Stable  Post-op Vital Signs: Reviewed and stable  Last Vitals:  Filed Vitals:   03/18/15 1415  BP: 138/95  Pulse: 124  Temp:   Resp: 16    Complications: Patient re-intubated

## 2015-03-18 NOTE — Anesthesia Procedure Notes (Addendum)
Procedure Name: Intubation Date/Time: 03/18/2015 12:42 PM Performed by: Charmaine Downs Pre-anesthesia Checklist: Patient being monitored, Suction available, Emergency Drugs available and Patient identified Patient Re-evaluated:Patient Re-evaluated prior to inductionOxygen Delivery Method: Circle system utilized Preoxygenation: Pre-oxygenation with 100% oxygen Intubation Type: IV induction, Rapid sequence and Cricoid Pressure applied Ventilation: Mask ventilation without difficulty and Oral airway inserted - appropriate to patient size Grade View: Grade III Tube type: Oral Tube size: 8.0 mm Number of attempts: 1 Airway Equipment and Method: Stylet and Video-laryngoscopy Placement Confirmation: ETT inserted through vocal cords under direct vision,  positive ETCO2 and breath sounds checked- equal and bilateral Secured at: 24 cm Tube secured with: Tape Dental Injury: Teeth and Oropharynx as per pre-operative assessment  Difficulty Due To: Difficulty was anticipated and Difficult Airway- due to large tongue Future Recommendations: Recommend- induction with short-acting agent, and alternative techniques readily available   Procedure Name: Intubation Date/Time: 03/18/2015 2:15 PM Performed by: Charmaine Downs Pre-anesthesia Checklist: Emergency Drugs available, Suction available, Patient being monitored and Timeout performed Patient Re-evaluated:Patient Re-evaluated prior to inductionOxygen Delivery Method: Circle system utilized Preoxygenation: Pre-oxygenation with 100% oxygen Intubation Type: IV induction and Cricoid Pressure applied Ventilation: Oral airway inserted - appropriate to patient size Grade View: Grade III Tube type: Oral Tube size: 8.0 mm Number of attempts: 1 Airway Equipment and Method: Video-laryngoscopy and Stylet Placement Confirmation: ETT inserted through vocal cords under direct vision and positive ETCO2 Secured at: 26 cm Tube secured with: Tape Dental  Injury: Teeth and Oropharynx as per pre-operative assessment  Difficulty Due To: Difficulty was anticipated Future Recommendations: Recommend- induction with short-acting agent, and alternative techniques readily available

## 2015-03-18 NOTE — OR Nursing (Signed)
Recheck CBG ordered per Dr. Patsey Berthold reading 82. 25 ml dextrose ordered per Dr. Patsey Berthold to OR per Patrici Ranks

## 2015-03-18 NOTE — Anesthesia Preprocedure Evaluation (Addendum)
Anesthesia Evaluation  Patient identified by MRN, date of birth, ID band Patient awake    Reviewed: Allergy & Precautions, H&P , NPO status , Patient's Chart, lab work & pertinent test results  Airway Mallampati: III  TM Distance: >3 FB Neck ROM: Full    Dental  (+) Dental Advisory Given, Poor Dentition, Missing, Edentulous Upper, Partial Lower,    Pulmonary sleep apnea, Continuous Positive Airway Pressure Ventilation and Oxygen sleep apnea , former smoker,  OSA, Bronchospastic component on inhalers, home oxygen 24/7 2 liters, has trouble laying flat for long periods, CPAP has not used last several weeks 2nd to congestion nasal breath sounds clear to auscultation        Cardiovascular hypertension, Pt. on medications +CHF (EF 35-40%) + dysrhythmias Atrial Fibrillation Rhythm:Irregular  Atrial flutter.  Has had previous ablation,decresed EF 35-40%t   Neuro/Psych    GI/Hepatic GERD-  Medicated and Controlled,  Endo/Other  diabetes, Type 2, Insulin DependentMorbid obesity  Renal/GU Dialysis and CRFRenal disease     Musculoskeletal   Abdominal (+) + obese,   Peds  Hematology  (+) anemia ,   Anesthesia Other Findings Missing some lower teeth  Reproductive/Obstetrics                            Anesthesia Physical Anesthesia Plan  ASA: III  Anesthesia Plan: General   Post-op Pain Management:    Induction: Intravenous, Rapid sequence and Cricoid pressure planned  Airway Management Planned: Oral ETT  Additional Equipment:   Intra-op Plan:   Post-operative Plan: Extubation in OR  Informed Consent: I have reviewed the patients History and Physical, chart, labs and discussed the procedure including the risks, benefits and alternatives for the proposed anesthesia with the patient or authorized representative who has indicated his/her understanding and acceptance.     Plan Discussed with:    Anesthesia Plan Comments:         Anesthesia Quick Evaluation

## 2015-03-18 NOTE — Progress Notes (Signed)
64 YO male s/p I&D with difficult ventilation post op. Pt was breathing spontaneously after adequate reversal of muscle relaxant and pt was taken to PACU under close observation. He continued to have labored breathing  And with a hx of ESRD decided best course was to reintubate and ventilate. Post intubation CO2 = 80-90  secondary to hypoventilation inreased work of breathing. Pt is ESRD and needs his fluid balance optimized before extubation.

## 2015-03-18 NOTE — Consult Note (Signed)
Reason for Consult: End-stage renal disease Referring Physician: Dr. Erin Lewis is an 64 y.o. male.  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 Patient was seen in emergency room prior to that because of rectal bleeding and was found to have exacerbation of his hemorrhoids. However when he was in dialysis unit patient does spike a temperature of 102. Blood culture was done and Ancef 2 g was given empirically. Patient however continued to have pain hence presently seems to have rectal abscess.  Past Medical History  Diagnosis Date  . Paroxysmal atrial fibrillation 2/09    Abnormal stress nuclear study in 01/2008; normal coronary angiography  . Hyperlipidemia 2008  . Hypertension 1993    Mild to moderate LVH  . Obesity     sleep apnea treated with BiPAP  . Anemia   . Gastroesophageal reflux disease   . Hyperparathyroidism     Secondary  . Allergic rhinitis   . Lower gastrointestinal bleed 04/2008    diverticulosis; hemorrhoids; constipation  . Diverticular disease   . Chronic back pain     Degenerative disc disease  . ESRD (end stage renal disease) on dialysis     Dialysis since 2007  . Renal insufficiency   . Sleep apnea 2010    CPAP followed at [place]  . Cataract     s/p extraction x 2  . Diabetes mellitus, type 2 1993    no insulin  . Atrial flutter   . Dialysis patient     Past Surgical History  Procedure Laterality Date  . Cataract extraction w/ intraocular lens  implant, bilateral  2008  . Lumbar peritoneal shunt    . Exploratory laparotomy      shunt complications  . Back surgery    . Cholecystectomy  1999  . Spine surgery      twice in lower back  . Atrial flutter ablation  06/18/14, 08/22/14    CTI ablation x 2 by Dr Rayann Heman  . Atrial flutter ablation N/A 06/18/2014    Procedure: ATRIAL  FLUTTER ABLATION;  Surgeon: Coralyn Mark, MD;  Location: [facility];  Service: Cardiovascular;  Laterality: N/A;  . Atrial flutter ablation N/A 08/22/2014    Procedure: ATRIAL FLUTTER ABLATION;  Surgeon: Coralyn Mark, MD;  Location: [facility];  Service: Cardiovascular;  Laterality: N/A;    Family History  Problem Relation Age of Onset  . Hypertension Mother   . Cancer Mother 64    breast   . Heart disease Mother   . Hyperlipidemia Mother   . Diabetes Mother   . Hypertension Sister 38  . Heart disease Sister   . Hyperlipidemia Sister   . Diabetes Sister   . Hypertension Daughter 63  . Diabetes Daughter   . Heart disease Daughter   . Heart disease Father   . Hyperlipidemia Father   . Hypertension Father   . Diabetes Father   . Diabetes Brother     Social History:  reports that he quit smoking about 26 years ago. His smoking use included Cigarettes. He started smoking about 46 years ago. He has a 15 pack-year smoking history. He has never used smokeless tobacco. He reports that he does not drink alcohol or use illicit drugs.  Allergies:  Allergies  Allergen Reactions  . Celebrex [Celecoxib] Other (See Comments)  Heart sped up  . Penicillins Other (See Comments)    Couldn't walk.   . Actos [Pioglitazone] Other (See Comments)    unknown    Medications: I have reviewed the patient's current medications.  Results for orders placed or performed during the hospital encounter of 03/17/15 (from the past 48 hour(s))  Comprehensive metabolic panel     Status: Abnormal   Collection Time: 03/17/15  5:35 PM  Result Value Ref Range   Sodium 136 135 - 145 mmol/L   Potassium 3.9 3.5 - 5.1 mmol/L   Chloride 96 96 - 112 mmol/L   CO2 27 19 - 32 mmol/L   Glucose, Bld 110 (H) 70 - 99 mg/dL   BUN 21 6 - 23 mg/dL   Creatinine, Ser 8.70 (H) 0.50 - 1.35 mg/dL   Calcium 8.6 8.4 - 10.5 mg/dL   Total Protein 7.0 6.0 - 8.3 g/dL   Albumin 3.1 (L) 3.5 - 5.2 g/dL   AST 34 0 - 37 U/L    ALT 32 0 - 53 U/L   Alkaline Phosphatase 72 39 - 117 U/L   Total Bilirubin 0.6 0.3 - 1.2 mg/dL   GFR calc non Af Amer 6 (L) >90 mL/min   GFR calc Af Amer 7 (L) >90 mL/min    Comment: (NOTE) The eGFR has been calculated using the CKD EPI equation. This calculation has not been validated in all clinical situations. eGFR's persistently <90 mL/min signify possible Chronic Kidney Disease.    Anion gap 13 5 - 15  CBC with Differential     Status: Abnormal   Collection Time: 03/17/15  5:35 PM  Result Value Ref Range   WBC 12.4 (H) 4.0 - 10.5 K/uL   RBC 4.30 4.22 - 5.81 MIL/uL   Hemoglobin 13.6 13.0 - 17.0 g/dL   HCT 41.3 39.0 - 52.0 %   MCV 96.0 78.0 - 100.0 fL   MCH 31.6 26.0 - 34.0 pg   MCHC 32.9 30.0 - 36.0 g/dL   RDW 14.0 11.5 - 15.5 %   Platelets 176 150 - 400 K/uL   Neutrophils Relative % 82 (H) 43 - 77 %   Neutro Abs 10.2 (H) 1.7 - 7.7 K/uL   Lymphocytes Relative 8 (L) 12 - 46 %   Lymphs Abs 1.1 0.7 - 4.0 K/uL   Monocytes Relative 10 3 - 12 %   Monocytes Absolute 1.2 (H) 0.1 - 1.0 K/uL   Eosinophils Relative 0 0 - 5 %   Eosinophils Absolute 0.0 0.0 - 0.7 K/uL   Basophils Relative 0 0 - 1 %   Basophils Absolute 0.0 0.0 - 0.1 K/uL  Lactic acid, plasma     Status: None   Collection Time: 03/17/15  5:35 PM  Result Value Ref Range   Lactic Acid, Venous 1.9 0.5 - 2.0 mmol/L  Culture, blood (routine x 2)     Status: None (Preliminary result)   Collection Time: 03/17/15  5:36 PM  Result Value Ref Range   Specimen Description BLOOD RIGHT HAND    Special Requests BOTTLES DRAWN AEROBIC AND ANAEROBIC 6CC    Culture PENDING    Report Status PENDING   Lactic acid, plasma     Status: None   Collection Time: 03/17/15  8:18 PM  Result Value Ref Range   Lactic Acid, Venous 1.0 0.5 - 2.0 mmol/L  Culture, blood (routine x 2)     Status: None (Preliminary result)   Collection Time: 03/17/15  9:45 PM  Result Value Ref Range   Specimen Description BLOOD RIGHT HAND    Special Requests  BOTTLES DRAWN AEROBIC AND ANAEROBIC 6CC    Culture PENDING    Report Status PENDING   Glucose, capillary     Status: None   Collection Time: 03/18/15  1:05 AM  Result Value Ref Range   Glucose-Capillary 86 70 - 99 mg/dL   Comment 1 Notify RN   APTT     Status: None   Collection Time: 03/18/15  5:52 AM  Result Value Ref Range   aPTT 37 24 - 37 seconds    Comment:        IF BASELINE aPTT IS ELEVATED, SUGGEST PATIENT RISK ASSESSMENT BE USED TO DETERMINE APPROPRIATE ANTICOAGULANT THERAPY.   Protime-INR     Status: Abnormal   Collection Time: 03/18/15  5:52 AM  Result Value Ref Range   Prothrombin Time 15.4 (H) 11.6 - 15.2 seconds   INR 1.21 0.00 - 1.49  CBC     Status: Abnormal   Collection Time: 03/18/15  5:52 AM  Result Value Ref Range   WBC 10.4 4.0 - 10.5 K/uL   RBC 3.96 (L) 4.22 - 5.81 MIL/uL   Hemoglobin 12.2 (L) 13.0 - 17.0 g/dL   HCT 37.9 (L) 39.0 - 52.0 %   MCV 95.7 78.0 - 100.0 fL   MCH 30.8 26.0 - 34.0 pg   MCHC 32.2 30.0 - 36.0 g/dL   RDW 13.9 11.5 - 15.5 %   Platelets 170 150 - 400 K/uL  Comprehensive metabolic panel     Status: Abnormal   Collection Time: 03/18/15  5:52 AM  Result Value Ref Range   Sodium 137 135 - 145 mmol/L   Potassium 4.0 3.5 - 5.1 mmol/L   Chloride 95 (L) 96 - 112 mmol/L   CO2 28 19 - 32 mmol/L   Glucose, Bld 70 70 - 99 mg/dL   BUN 25 (H) 6 - 23 mg/dL   Creatinine, Ser 9.81 (H) 0.50 - 1.35 mg/dL   Calcium 8.5 8.4 - 10.5 mg/dL   Total Protein 6.4 6.0 - 8.3 g/dL   Albumin 2.9 (L) 3.5 - 5.2 g/dL   AST 44 (H) 0 - 37 U/L   ALT 25 0 - 53 U/L   Alkaline Phosphatase 69 39 - 117 U/L   Total Bilirubin 0.6 0.3 - 1.2 mg/dL   GFR calc non Af Amer 5 (L) >90 mL/min   GFR calc Af Amer 6 (L) >90 mL/min    Comment: (NOTE) The eGFR has been calculated using the CKD EPI equation. This calculation has not been validated in all clinical situations. eGFR's persistently <90 mL/min signify possible Chronic Kidney Disease.    Anion gap 14 5 - 15   Glucose, capillary     Status: None   Collection Time: 03/18/15  7:51 AM  Result Value Ref Range   Glucose-Capillary 78 70 - 99 mg/dL    Ct Abdomen Pelvis Wo Contrast  03/17/2015   CLINICAL DATA:  Acute rectal pain, fever, diarrhea .  EXAM: CT ABDOMEN AND PELVIS WITHOUT CONTRAST  TECHNIQUE: Multidetector CT imaging of the abdomen and pelvis was performed following the standard protocol without IV contrast.  COMPARISON:  04/10/2012, 01/21/2012  FINDINGS: Lower chest: Minor scattered basilar atelectasis. Normal heart size. No pericardial or pleural effusion. Small hiatal hernia suspected. Degenerative changes of the lower thoracic spine.  Abdomen: Gallbladder is not visualized. Liver, biliary system, pancreas, and spleen within normal limits for age  and noncontrast imaging.  Kidneys demonstrate extensive polycystic kidney disease. Several cysts demonstrate hyper attenuation compatible with protein or hemorrhage. Several cysts are also calcified. No renal obstruction or hydronephrosis. Stable mild thickening of the adrenal glands bilaterally, suspect hyperplasia.  Negative for bowel obstruction, dilatation, ileus, or free air. Postop changes of the abdominal wall. No ventral hernia evident.  Normal appendix in right lower quadrant.  No abdominal free fluid, fluid collection, hemorrhage, abscess, or adenopathy.  Aortic iliac atherosclerosis noted without aneurysm or retroperitoneal hemorrhage.  Pelvis: Colonic diverticulosis noted diffusely. No pelvic free fluid, fluid collection, hemorrhage, or adenopathy. Vascular calcifications noted. Small fat containing inguinal hernias bilaterally. In the rectal region, there is a left perirectal low-attenuation area extending into the gluteal soft tissues. This roughly measures 4.2 x 2.5 cm, image 15 series 3. This is ill-defined by noncontrast imaging but suspicious for a perirectal abscess.  Diffuse degenerative changes of the visualized spine, SI joints and hips. No  acute osseous finding.  IMPRESSION: 4.2 x 5.2 cm right perirectal thick-walled hypodense fluid collection by noncontrast imaging, suspect perirectal abscess.  Bilateral polycystic kidney disease.  Aortoiliac atherosclerosis without aneurysm  Colonic diverticulosis  Normal appendix  Fat containing inguinal hernias bilaterally   Electronically Signed   By: Jerilynn Mages.  Shick M.D.   On: 03/17/2015 20:56   Dg Chest 2 View  03/17/2015   CLINICAL DATA:  Acute fever, congestion cough  EXAM: CHEST  2 VIEW  COMPARISON:  07/15/2014  FINDINGS: Mild hyperinflation without focal pneumonia, collapse or consolidation. Negative for edema, effusion, or pneumothorax. Trachea midline. Normal heart size and vascularity. Degenerative changes of the spine diffusely. The trachea is midline. No acute osseous finding.  IMPRESSION: No acute chest process.   Electronically Signed   By: Jerilynn Mages.  Shick M.D.   On: 03/17/2015 20:23    Review of Systems  Constitutional: Positive for fever and chills.  Respiratory: Negative for cough and shortness of breath.   Cardiovascular: Negative for orthopnea and claudication.  Gastrointestinal: Positive for blood in stool. Negative for nausea, vomiting and abdominal pain.  Musculoskeletal: Positive for back pain.   Blood pressure 141/56, pulse 94, temperature 98 F (36.7 C), temperature source Oral, resp. rate 18, height _0  (1.88 m), weight 114.76 kg (253 lb), SpO2 94 %. Physical Exam  Constitutional: He is oriented to person, place, and time. No distress.  Eyes: No scleral icterus.  Neck: No JVD present.  Cardiovascular: Normal rate and regular rhythm.   Respiratory: No respiratory distress.  GI: He exhibits no distension. There is no tenderness.  Musculoskeletal: He exhibits no edema.  Neurological: He is alert and oriented to person, place, and time.    Assessment/Plan: Problem #1: Fever: Most likely secondary to rectal abscess. Presently patient is a febrile and his white blood cell count  has improved. Problem #2 end-stage renal disease he is status post hemodialysis yesterday. Presently he doesn't have any uremic sinus symptoms. Problem #3 obesity Problem #4 history of sleep apnea Problem #5 history of bilateral fibrillation Problem #6 history of diabetes Problem #7 anemia: His hemoglobin and hematocrit is out of range. Plan: We'll make arrangements for patient to get dialysis tomorrow. We'll continue his other medications as before.  Albert Lewis S 03/18/2015, 8:10 AM

## 2015-03-18 NOTE — Consult Note (Signed)
Consult requested by:Dr. Arnoldo Morale Consult requested forrespiratory failure:  HPI: This is a 64 year old who had surgery for a perirectal abscess today. He was unable to ventilate properly after the surgery and had to be reintubated. He has multiple other medical problems including paroxysmal atrial fibrillation, obesity with obesity hypoventilation and sleep apnea, end-stage renal disease on dialysis diabetes and chronic back pain. No pulmonary disease is listed. I asked him if he had any lung trouble and he shook his head no.  Past Medical History  Diagnosis Date  . Paroxysmal atrial fibrillation 2/09    Abnormal stress nuclear study in 01/2008; normal coronary angiography  . Hyperlipidemia 2008  . Hypertension 1993    Mild to moderate LVH  . Obesity     sleep apnea treated with BiPAP  . Anemia   . Gastroesophageal reflux disease   . Hyperparathyroidism     Secondary  . Allergic rhinitis   . Lower gastrointestinal bleed 04/2008    diverticulosis; hemorrhoids; constipation  . Diverticular disease   . Chronic back pain     Degenerative disc disease  . ESRD (end stage renal disease) on dialysis     Dialysis since 2007  . Renal insufficiency   . Sleep apnea 2010    CPAP followed at [place]  . Cataract     s/p extraction x 2  . Diabetes mellitus, type 2 1993    no insulin  . Atrial flutter   . Dialysis patient      Family History  Problem Relation Age of Onset  . Hypertension Mother   . Cancer Mother 58    breast   . Heart disease Mother   . Hyperlipidemia Mother   . Diabetes Mother   . Hypertension Sister 73  . Heart disease Sister   . Hyperlipidemia Sister   . Diabetes Sister   . Hypertension Daughter 68  . Diabetes Daughter   . Heart disease Daughter   . Heart disease Father   . Hyperlipidemia Father   . Hypertension Father   . Diabetes Father   . Diabetes Brother      History   Social History  . Marital Status: Divorced    Spouse Name: N/A  . Number of  Children: 2  . Years of Education: N/A   Social History Main Topics  . Smoking status: Former Smoker -- 0.75 packs/day for 20 years    Types: Cigarettes    Start date: 03/20/1969    Quit date: 12/20/1988  . Smokeless tobacco: Never Used  . Alcohol Use: No  . Drug Use: No  . Sexual Activity: Yes   Other Topics Concern  . None   Social History Narrative   Married lives with spouse since 63. Patient has 11 siblings .     ROS: Not obtainable    Objective: Vital signs in last 24 hours: Temp:  [98 F (36.7 C)-99.7 F (37.6 C)] 98.3 F (36.8 C) (03/29 1600) Pulse Rate:  [83-124] 98 (03/29 1645) Resp:  [8-23] 20 (03/29 1645) BP: (88-171)/(38-98) 111/38 mmHg (03/29 1645) SpO2:  [90 %-100 %] 96 % (03/29 1645) FiO2 (%):  [40 %] 40 % (03/29 1448) Weight change:     Intake/Output from previous day:    PHYSICAL EXAM He is awake. He is on the ventilator. His pupils are reactive. Nose and throat are clear. Mucous membranes are moist. His neck is supple. His chest shows rhonchi bilaterally. His heart is regular without gallop. His abdomen is soft and obese  without masses. He does not have significant edema. Central nervous system exam is grossly intact  Lab Results: Basic Metabolic Panel:  Recent Labs  03/17/15 1735 03/18/15 0552  NA 136 137  K 3.9 4.0  CL 96 95*  CO2 27 28  GLUCOSE 110* 70  BUN 21 25*  CREATININE 8.70* 9.81*  CALCIUM 8.6 8.5   Liver Function Tests:  Recent Labs  03/17/15 1735 03/18/15 0552  AST 34 44*  ALT 32 25  ALKPHOS 72 69  BILITOT 0.6 0.6  PROT 7.0 6.4  ALBUMIN 3.1* 2.9*   No results for input(s): LIPASE, AMYLASE in the last 72 hours. No results for input(s): AMMONIA in the last 72 hours. CBC:  Recent Labs  03/17/15 1735 03/18/15 0552  WBC 12.4* 10.4  NEUTROABS 10.2*  --   HGB 13.6 12.2*  HCT 41.3 37.9*  MCV 96.0 95.7  PLT 176 170   Cardiac Enzymes: No results for input(s): CKTOTAL, CKMB, CKMBINDEX, TROPONINI in the last  72 hours. BNP: No results for input(s): PROBNP in the last 72 hours. D-Dimer: No results for input(s): DDIMER in the last 72 hours. CBG:  Recent Labs  03/18/15 0105 03/18/15 0751 03/18/15 1029 03/18/15 1221 03/18/15 1357  GLUCAP 86 78 72 82 126*   Hemoglobin A1C: No results for input(s): HGBA1C in the last 72 hours. Fasting Lipid Panel: No results for input(s): CHOL, HDL, LDLCALC, TRIG, CHOLHDL, LDLDIRECT in the last 72 hours. Thyroid Function Tests: No results for input(s): TSH, T4TOTAL, FREET4, T3FREE, THYROIDAB in the last 72 hours. Anemia Panel: No results for input(s): VITAMINB12, FOLATE, FERRITIN, TIBC, IRON, RETICCTPCT in the last 72 hours. Coagulation:  Recent Labs  03/18/15 0552  LABPROT 15.4*  INR 1.21   Urine Drug Screen: Drugs of Abuse  No results found for: LABOPIA, COCAINSCRNUR, LABBENZ, AMPHETMU, THCU, LABBARB  Alcohol Level: No results for input(s): ETH in the last 72 hours. Urinalysis: No results for input(s): COLORURINE, LABSPEC, PHURINE, GLUCOSEU, HGBUR, BILIRUBINUR, KETONESUR, PROTEINUR, UROBILINOGEN, NITRITE, LEUKOCYTESUR in the last 72 hours.  Invalid input(s): APPERANCEUR Misc. Labs:   ABGS:  Recent Labs  03/18/15 1539  PHART 7.317*  PO2ART 73.0*  TCO2 22.0  HCO3 24.4*     MICROBIOLOGY: Recent Results (from the past 240 hour(s))  Culture, blood (routine x 2)     Status: None (Preliminary result)   Collection Time: 03/17/15  5:36 PM  Result Value Ref Range Status   Specimen Description BLOOD RIGHT HAND  Final   Special Requests BOTTLES DRAWN AEROBIC AND ANAEROBIC 6CC  Final   Culture PENDING  Incomplete   Report Status PENDING  Incomplete  Culture, blood (routine x 2)     Status: None (Preliminary result)   Collection Time: 03/17/15  9:45 PM  Result Value Ref Range Status   Specimen Description BLOOD RIGHT HAND  Final   Special Requests BOTTLES DRAWN AEROBIC AND ANAEROBIC 6CC  Final   Culture PENDING  Incomplete   Report  Status PENDING  Incomplete  Surgical pcr screen     Status: None   Collection Time: 03/18/15 10:45 AM  Result Value Ref Range Status   MRSA, PCR NEGATIVE NEGATIVE Final   Staphylococcus aureus NEGATIVE NEGATIVE Final    Comment:        The Xpert SA Assay (FDA approved for NASAL specimens in patients over 75 years of age), is one component of a comprehensive surveillance program.  Test performance has been validated by [facility] for patients greater than or  equal to 60 year old. It is not intended to diagnose infection nor to guide or monitor treatment.   Anaerobic culture     Status: None (Preliminary result)   Collection Time: 03/18/15  1:30 PM  Result Value Ref Range Status   Specimen Description PERIRECTAL  Final   Special Requests NONE  Final   Gram Stain PENDING  Incomplete   Culture PENDING  Incomplete   Report Status PENDING  Incomplete    Studies/Results: Ct Abdomen Pelvis Wo Contrast  03/17/2015   CLINICAL DATA:  Acute rectal pain, fever, diarrhea .  EXAM: CT ABDOMEN AND PELVIS WITHOUT CONTRAST  TECHNIQUE: Multidetector CT imaging of the abdomen and pelvis was performed following the standard protocol without IV contrast.  COMPARISON:  04/10/2012, 01/21/2012  FINDINGS: Lower chest: Minor scattered basilar atelectasis. Normal heart size. No pericardial or pleural effusion. Small hiatal hernia suspected. Degenerative changes of the lower thoracic spine.  Abdomen: Gallbladder is not visualized. Liver, biliary system, pancreas, and spleen within normal limits for age and noncontrast imaging.  Kidneys demonstrate extensive polycystic kidney disease. Several cysts demonstrate hyper attenuation compatible with protein or hemorrhage. Several cysts are also calcified. No renal obstruction or hydronephrosis. Stable mild thickening of the adrenal glands bilaterally, suspect hyperplasia.  Negative for bowel obstruction, dilatation, ileus, or free air. Postop changes of the abdominal  wall. No ventral hernia evident.  Normal appendix in right lower quadrant.  No abdominal free fluid, fluid collection, hemorrhage, abscess, or adenopathy.  Aortic iliac atherosclerosis noted without aneurysm or retroperitoneal hemorrhage.  Pelvis: Colonic diverticulosis noted diffusely. No pelvic free fluid, fluid collection, hemorrhage, or adenopathy. Vascular calcifications noted. Small fat containing inguinal hernias bilaterally. In the rectal region, there is a left perirectal low-attenuation area extending into the gluteal soft tissues. This roughly measures 4.2 x 2.5 cm, image 15 series 3. This is ill-defined by noncontrast imaging but suspicious for a perirectal abscess.  Diffuse degenerative changes of the visualized spine, SI joints and hips. No acute osseous finding.  IMPRESSION: 4.2 x 5.2 cm right perirectal thick-walled hypodense fluid collection by noncontrast imaging, suspect perirectal abscess.  Bilateral polycystic kidney disease.  Aortoiliac atherosclerosis without aneurysm  Colonic diverticulosis  Normal appendix  Fat containing inguinal hernias bilaterally   Electronically Signed   By: Jerilynn Mages.  Shick M.D.   On: 03/17/2015 20:56   Dg Chest 2 View  03/17/2015   CLINICAL DATA:  Acute fever, congestion cough  EXAM: CHEST  2 VIEW  COMPARISON:  07/15/2014  FINDINGS: Mild hyperinflation without focal pneumonia, collapse or consolidation. Negative for edema, effusion, or pneumothorax. Trachea midline. Normal heart size and vascularity. Degenerative changes of the spine diffusely. The trachea is midline. No acute osseous finding.  IMPRESSION: No acute chest process.   Electronically Signed   By: Jerilynn Mages.  Shick M.D.   On: 03/17/2015 20:23   Dg Chest Port 1 View  03/18/2015   CLINICAL DATA:  Evaluate endotracheal tube position  EXAM: PORTABLE CHEST - 1 VIEW  COMPARISON:  03/17/2015  FINDINGS: Endotracheal tube tip is 4 cm above the carinal. NG tube crosses the gastroesophageal junction with tip not visualized.  Mild to moderate bilateral lower lobe atelectasis.  IMPRESSION: Support devices as described.  Bibasilar atelectasis.   Electronically Signed   By: Skipper Cliche M.D.   On: 03/18/2015 16:48    Medications:  Prior to Admission:  Prescriptions prior to admission  Medication Sig Dispense Refill Last Dose  . acetaminophen (TYLENOL) 325 MG tablet Take 650 mg  by mouth every 6 (six) hours as needed for moderate pain.   03/17/2015 at Unknown time  . albuterol (PROVENTIL HFA;VENTOLIN HFA) 108 (90 BASE) MCG/ACT inhaler Inhale 2 puffs into the lungs every 6 (six) hours as needed for wheezing. 1 Inhaler 2 Past Week at Unknown time  . albuterol (PROVENTIL) (2.5 MG/3ML) 0.083% nebulizer solution Take 3 mLs (2.5 mg total) by nebulization every 6 (six) hours as needed for wheezing or shortness of breath. 75 mL 0 Past Week at Unknown time  . allopurinol (ZYLOPRIM) 100 MG tablet Take 100 mg by mouth daily.    03/16/2015 at Unknown time  . B Complex-C-Folic Acid (RENAL MULTIVITAMIN FORMULA PO) Take 1 tablet by mouth daily.   03/16/2015 at Unknown time  . cetirizine (ZYRTEC) 10 MG tablet Take 10 mg by mouth daily.    03/16/2015 at Unknown time  . cinacalcet (SENSIPAR) 90 MG tablet Take 90 mg by mouth daily.   03/16/2015 at Unknown time  . docusate sodium (COLACE) 100 MG capsule Take 200 mg by mouth 2 (two) times daily as needed for moderate constipation.    Past Week at Unknown time  . fish oil-omega-3 fatty acids 1000 MG capsule Take 2 g by mouth 3 (three) times daily. SUPER OMEGA 3 FORMULA: EPA 300/DHA20   03/16/2015 at Unknown time  . gabapentin (NEURONTIN) 100 MG capsule Take 1 capsule (100 mg total) by mouth 2 (two) times daily. 60 capsule 3 03/16/2015 at Unknown time  . gemfibrozil (LOPID) 600 MG tablet Take 600 mg by mouth 2 (two) times daily.   03/16/2015 at Unknown time  . HYDROcodone-acetaminophen (NORCO/VICODIN) 5-325 MG per tablet Take 2 tablets by mouth every 4 (four) hours as needed. (Patient taking  differently: Take 2 tablets by mouth every 4 (four) hours as needed for moderate pain or severe pain. ) 10 tablet 0 03/16/2015 at Unknown time  . hydrocortisone (ANUSOL-HC) 25 MG suppository Place 1 suppository (25 mg total) rectally 2 (two) times daily. 12 suppository 0 03/16/2015 at Unknown time  . insulin glargine (LANTUS) 100 UNIT/ML injection Inject 22 Units into the skin at bedtime.    03/16/2015 at Unknown time  . ipratropium (ATROVENT) 0.02 % nebulizer solution Take 2.5 mLs (0.5 mg total) by nebulization 4 (four) times daily. 75 mL 0 Past Week at Unknown time  . pantoprazole (PROTONIX) 40 MG tablet Take 40 mg by mouth daily.   03/16/2015 at Unknown time  . sevelamer (RENVELA) 800 MG tablet Take 2,400-4,800 mg by mouth See admin instructions. Takes 6 tablets (4800 mg) three times daily with meals and 3 tablets (2400 mg) with snack.   03/16/2015 at Unknown time  . magnesium citrate SOLN Take 148 mLs (0.5 Bottles total) by mouth once. 1/2 bottle today. One half bottle tomorrow if still constipated (Patient not taking: Reported on 03/17/2015) 195 mL 0 Taking  . PRESCRIPTION MEDICATION Place 1 drop into both eyes 4 (four) times daily. For dry eyes   Taking   Scheduled: . antiseptic oral rinse  7 mL Mouth Rinse QID  . chlorhexidine  15 mL Mouth Rinse BID  . cinacalcet  90 mg Oral Q breakfast  . ciprofloxacin  400 mg Intravenous Q24H  . gabapentin  100 mg Oral BID  . gemfibrozil  600 mg Oral BID  . insulin aspart  0-15 Units Subcutaneous TID WC  . insulin aspart  0-5 Units Subcutaneous QHS  . loratadine  10 mg Oral Daily  . multivitamin   Oral Daily  .  pantoprazole  40 mg Oral Daily  . sevelamer carbonate  2,400 mg Oral With snacks  . sevelamer carbonate  4,800 mg Oral TID WC  . sodium chloride  3 mL Intravenous Q12H  . sodium chloride  3 mL Intravenous Q12H   Continuous: . sodium chloride 50 mL/hr at 03/18/15 0113   LTJ:QZESPQ chloride, fentaNYL, fentaNYL, HYDROcodone-acetaminophen,  metronidazole, ondansetron **OR** ondansetron (ZOFRAN) IV, sodium chloride  Assesment: He had a perirectal abscess. This was incised and drained. He has acute respiratory failure with hypercapnia following the surgery it is not clear if this is simply slow to resolve his medications from anesthesia potentially related to his renal failure or some other problem. He is more alert now. Principal Problem:   Perirectal abscess Active Problems:   DM type 2 causing ESRD   Gout   Obesity (BMI 30-39.9)   Essential hypertension   ESRD   GERD (gastroesophageal reflux disease)   History of atrial fibrillation   Acute respiratory failure with hypercapnia    Plan: He will remain on the ventilator overnight and then reevaluate in the morning    LOS: 1 day   Nesha Counihan L 03/18/2015, 5:57 PM

## 2015-03-18 NOTE — Care Management Utilization Note (Signed)
UR completed 

## 2015-03-18 NOTE — Progress Notes (Signed)
[facility] PROGRESS NOTE  ARN MCOMBER XNT:[id number] DOB: 1950/12/22 DOA: 03/17/2015 PCP: Tula Nakayama, MD  Assessment/Plan: Acute Respiratory Failure, with Hypercapnea -Following surgery. Suspect related to OHS/OSA and narcotics/benzos given during surgery. -Was difficult to wake up and decision was made to re-intubate. -By the time of my evaluation he is intubated and on the ventilator in the ICU. -Will keep ventilated today and try to extubate in am.  Perirectal Abscess -S/p I and D in the OR today. -Continue cipro/flagyl. -OP cultures pending.  ESRD -HD MWF. -Renal following.  DM II -Controlled. -Continue current management.  Hyperlipidemia -Continue gemfibrozil.  GERD -Continue PPI     Code Status: Full Code Family Communication: patient only  Disposition Plan: To be determined   Consultants:  Surgery  Renal   Antibiotics:  Cipro  Flagyl   Subjective: Intubated. Can nod/shake head in answer to questions.  Objective: Filed Vitals:   03/18/15 1445 03/18/15 1500 03/18/15 1515 03/18/15 1530  BP: 159/74 171/70 171/74 156/64  Pulse: 123 118 116 112  Temp:      TempSrc:      Resp: 15 15 15 18   Height:      Weight:      SpO2: 91% 92% 93% 93%    Intake/Output Summary (Last 24 hours) at 03/18/15 1551 Last data filed at 03/18/15 1350  Gross per 24 hour  Intake    700 ml  Output      0 ml  Net    700 ml   Filed Weights   03/17/15 1717  Weight: 114.76 kg (253 lb)    Exam:   General:  Intubated, remains somewhat sedated, pinpoint pupils  Cardiovascular: RRR  Respiratory: Coarse bilateral breath sounds  Abdomen: obese/S/NT/ND/+BS  Extremities: 1+ edema bilaterally   Neurologic:  sedated  Data Reviewed: Basic Metabolic Panel:  Recent Labs Lab 03/17/15 1735 03/18/15 0552  NA 136 137  K 3.9 4.0  CL 96 95*  CO2 27 28  GLUCOSE 110* 70  BUN 21 25*  CREATININE 8.70* 9.81*  CALCIUM 8.6 8.5   Liver  Function Tests:  Recent Labs Lab 03/17/15 1735 03/18/15 0552  AST 34 44*  ALT 32 25  ALKPHOS 72 69  BILITOT 0.6 0.6  PROT 7.0 6.4  ALBUMIN 3.1* 2.9*   No results for input(s): LIPASE, AMYLASE in the last 168 hours. No results for input(s): AMMONIA in the last 168 hours. CBC:  Recent Labs Lab 03/17/15 1735 03/18/15 0552  WBC 12.4* 10.4  NEUTROABS 10.2*  --   HGB 13.6 12.2*  HCT 41.3 37.9*  MCV 96.0 95.7  PLT 176 170   Cardiac Enzymes: No results for input(s): CKTOTAL, CKMB, CKMBINDEX, TROPONINI in the last 168 hours. BNP (last 3 results) No results for input(s): BNP in the last 8760 hours.  ProBNP (last 3 results)  Recent Labs  06/17/14 1940 07/16/14 0813  PROBNP 2925.0* 9235.0*    CBG:  Recent Labs Lab 03/18/15 0105 03/18/15 0751 03/18/15 1029 03/18/15 1221 03/18/15 1357  GLUCAP 86 78 72 82 126*    Recent Results (from the past 240 hour(s))  Culture, blood (routine x 2)     Status: None (Preliminary result)   Collection Time: 03/17/15  5:36 PM  Result Value Ref Range Status   Specimen Description BLOOD RIGHT HAND  Final   Special Requests BOTTLES DRAWN AEROBIC AND ANAEROBIC [facility]  Final   Culture PENDING  Incomplete   Report Status PENDING  Incomplete  Culture,  blood (routine x 2)     Status: None (Preliminary result)   Collection Time: 03/17/15  9:45 PM  Result Value Ref Range Status   Specimen Description BLOOD RIGHT HAND  Final   Special Requests BOTTLES DRAWN AEROBIC AND ANAEROBIC 6CC  Final   Culture PENDING  Incomplete   Report Status PENDING  Incomplete  Surgical pcr screen     Status: None   Collection Time: 03/18/15 10:45 AM  Result Value Ref Range Status   MRSA, PCR NEGATIVE NEGATIVE Final   Staphylococcus aureus NEGATIVE NEGATIVE Final    Comment:        The Xpert SA Assay (FDA approved for NASAL specimens in patients over 30 years of age), is one component of a comprehensive surveillance program.  Test performance has been  validated by [facility] for patients greater than or equal to 65 year old. It is not intended to diagnose infection nor to guide or monitor treatment.   Anaerobic culture     Status: None (Preliminary result)   Collection Time: 03/18/15  1:30 PM  Result Value Ref Range Status   Specimen Description PERIRECTAL  Final   Special Requests NONE  Final   Gram Stain PENDING  Incomplete   Culture PENDING  Incomplete   Report Status PENDING  Incomplete     Studies: Ct Abdomen Pelvis Wo Contrast  03/17/2015   CLINICAL DATA:  Acute rectal pain, fever, diarrhea .  EXAM: CT ABDOMEN AND PELVIS WITHOUT CONTRAST  TECHNIQUE: Multidetector CT imaging of the abdomen and pelvis was performed following the standard protocol without IV contrast.  COMPARISON:  04/10/2012, 01/21/2012  FINDINGS: Lower chest: Minor scattered basilar atelectasis. Normal heart size. No pericardial or pleural effusion. Small hiatal hernia suspected. Degenerative changes of the lower thoracic spine.  Abdomen: Gallbladder is not visualized. Liver, biliary system, pancreas, and spleen within normal limits for age and noncontrast imaging.  Kidneys demonstrate extensive polycystic kidney disease. Several cysts demonstrate hyper attenuation compatible with protein or hemorrhage. Several cysts are also calcified. No renal obstruction or hydronephrosis. Stable mild thickening of the adrenal glands bilaterally, suspect hyperplasia.  Negative for bowel obstruction, dilatation, ileus, or free air. Postop changes of the abdominal wall. No ventral hernia evident.  Normal appendix in right lower quadrant.  No abdominal free fluid, fluid collection, hemorrhage, abscess, or adenopathy.  Aortic iliac atherosclerosis noted without aneurysm or retroperitoneal hemorrhage.  Pelvis: Colonic diverticulosis noted diffusely. No pelvic free fluid, fluid collection, hemorrhage, or adenopathy. Vascular calcifications noted. Small fat containing inguinal hernias  bilaterally. In the rectal region, there is a left perirectal low-attenuation area extending into the gluteal soft tissues. This roughly measures 4.2 x 2.5 cm, image 15 series 3. This is ill-defined by noncontrast imaging but suspicious for a perirectal abscess.  Diffuse degenerative changes of the visualized spine, SI joints and hips. No acute osseous finding.  IMPRESSION: 4.2 x 5.2 cm right perirectal thick-walled hypodense fluid collection by noncontrast imaging, suspect perirectal abscess.  Bilateral polycystic kidney disease.  Aortoiliac atherosclerosis without aneurysm  Colonic diverticulosis  Normal appendix  Fat containing inguinal hernias bilaterally   Electronically Signed   By: Jerilynn Mages.  Shick M.D.   On: 03/17/2015 20:56   Dg Chest 2 View  03/17/2015   CLINICAL DATA:  Acute fever, congestion cough  EXAM: CHEST  2 VIEW  COMPARISON:  07/15/2014  FINDINGS: Mild hyperinflation without focal pneumonia, collapse or consolidation. Negative for edema, effusion, or pneumothorax. Trachea midline. Normal heart size and vascularity. Degenerative  changes of the spine diffusely. The trachea is midline. No acute osseous finding.  IMPRESSION: No acute chest process.   Electronically Signed   By: Jerilynn Mages.  Shick M.D.   On: 03/17/2015 20:23    Scheduled Meds: . antiseptic oral rinse  7 mL Mouth Rinse QID  . chlorhexidine  15 mL Mouth Rinse BID  . cinacalcet  90 mg Oral Q breakfast  . ciprofloxacin  400 mg Intravenous Q24H  . gabapentin  100 mg Oral BID  . gemfibrozil  600 mg Oral BID  . insulin aspart  0-15 Units Subcutaneous TID WC  . insulin aspart  0-5 Units Subcutaneous QHS  . loratadine  10 mg Oral Daily  . multivitamin   Oral Daily  . pantoprazole  40 mg Oral Daily  . sevelamer carbonate  2,400 mg Oral With snacks  . sevelamer carbonate  4,800 mg Oral TID WC  . sodium chloride  3 mL Intravenous Q12H  . sodium chloride  3 mL Intravenous Q12H   Continuous Infusions: . sodium chloride 50 mL/hr at 03/18/15  0113    Principal Problem:   Perirectal abscess Active Problems:   Acute respiratory failure with hypercapnia   DM type 2 causing ESRD   Gout   Obesity (BMI 30-39.9)   Essential hypertension   ESRD   GERD (gastroesophageal reflux disease)   History of atrial fibrillation    Time spent: 35 minutes. Greater than 50% of this time was spent in direct contact with the patient coordinating care.    Lelon Frohlich  [facility] Pager [phone]  If 7PM-7AM, please contact night-coverage at [email or web address], password [facility] 03/18/2015, 3:51 PM  LOS: 1 day

## 2015-03-18 NOTE — Progress Notes (Signed)
Called daughter but no answer. Called sister, Onalee Hua, was unaware that patient had been placed back on the ventilator after surgery. Onalee Hua will call patients daughter and try to come up to see him.

## 2015-03-18 NOTE — Progress Notes (Signed)
Order to get step down bed per dr Arnoldo Morale. Order placed received step bed then decision made to reintubate. Respiratory notified to have vent ready.

## 2015-03-18 NOTE — Progress Notes (Signed)
Patient being reintubated call iccu to make them aware that the patient would be coming on the vent. Gave a much report as i had explained to nadine rn that anesthesia would finish giving report because they had been dealing with the patient the whole time they were in pac[facility]. i just wante to make sure that the patient would still go to ICU 3.

## 2015-03-19 ENCOUNTER — Inpatient Hospital Stay (HOSPITAL_COMMUNITY): Payer: Medicare Other

## 2015-03-19 LAB — CBC
HEMATOCRIT: 35.5 % — AB (ref 39.0–52.0)
HEMOGLOBIN: 11.6 g/dL — AB (ref 13.0–17.0)
MCH: 30.8 pg (ref 26.0–34.0)
MCHC: 32.7 g/dL (ref 30.0–36.0)
MCV: 94.2 fL (ref 78.0–100.0)
PLATELETS: 162 10*3/uL (ref 150–400)
RBC: 3.77 MIL/uL — ABNORMAL LOW (ref 4.22–5.81)
RDW: 14 % (ref 11.5–15.5)
WBC: 12.5 10*3/uL — ABNORMAL HIGH (ref 4.0–10.5)

## 2015-03-19 LAB — BASIC METABOLIC PANEL
Anion gap: 15 (ref 5–15)
BUN: 40 mg/dL — AB (ref 6–23)
CALCIUM: 8.4 mg/dL (ref 8.4–10.5)
CO2: 25 mmol/L (ref 19–32)
Chloride: 95 mmol/L — ABNORMAL LOW (ref 96–112)
Creatinine, Ser: 11.9 mg/dL — ABNORMAL HIGH (ref 0.50–1.35)
GFR calc non Af Amer: 4 mL/min — ABNORMAL LOW (ref >90)
GFR, EST AFRICAN AMERICAN: 5 mL/min — AB (ref >90)
GLUCOSE: 70 mg/dL (ref 70–99)
Potassium: 4.6 mmol/L (ref 3.5–5.1)
SODIUM: 135 mmol/L (ref 135–145)

## 2015-03-19 MED ORDER — HEPARIN SODIUM (PORCINE) 1000 UNIT/ML DIALYSIS
1000.0000 [IU] | INTRAMUSCULAR | Status: DC | PRN
  Filled 2015-03-19: qty 1

## 2015-03-19 MED ORDER — LIDOCAINE-PRILOCAINE 2.5-2.5 % EX CREA
1.0000 "application " | TOPICAL_CREAM | CUTANEOUS | Status: DC | PRN
  Filled 2015-03-19: qty 5

## 2015-03-19 MED ORDER — SODIUM CHLORIDE 0.9 % IV SOLN: 100.0000 mL | INTRAVENOUS | Status: DC | PRN

## 2015-03-19 MED ORDER — METRONIDAZOLE IN NACL 5-0.79 MG/ML-% IV SOLN
500.0000 mg | Freq: Two times a day (BID) | INTRAVENOUS | Status: DC
  Administered 2015-03-19 – 2015-03-20 (×2): 500 mg via INTRAVENOUS
  Filled 2015-03-19 (×2): qty 100

## 2015-03-19 MED ORDER — PENTAFLUOROPROP-TETRAFLUOROETH EX AERO
1.0000 "application " | INHALATION_SPRAY | CUTANEOUS | Status: DC | PRN
  Filled 2015-03-19: qty 30

## 2015-03-19 MED ORDER — NEPRO/CARBSTEADY PO LIQD: 237.0000 mL | ORAL | Status: DC | PRN

## 2015-03-19 MED ORDER — LIDOCAINE HCL (PF) 1 % IJ SOLN: 5.0000 mL | INTRAMUSCULAR | Status: DC | PRN

## 2015-03-19 MED ORDER — ALTEPLASE 2 MG IJ SOLR
2.0000 mg | Freq: Once | INTRAMUSCULAR | Status: AC | PRN
  Filled 2015-03-19: qty 2

## 2015-03-19 NOTE — Progress Notes (Signed)
PS increased to 10.

## 2015-03-19 NOTE — Progress Notes (Signed)
PROGRESS NOTE  Albert Lewis VEL:[id number] DOB: 07-26-1951 DOA: 03/17/2015 PCP: Tula Nakayama, MD  Summary: 64 year old man presented with increasing rectal pain and abdominal tenderness as well as fever. Imaging revealed perirectal abscess. He was seen in consultation with general surgery and underwent incision and drainage of perianal abscess 3/29. Postoperatively he had difficulty ventilating and was subsequently reintubated. Difficulty was thought to be related to muscle relaxant and narcotic given intraoperatively.  Assessment/Plan: 1. Acute hypercapnic respiratory failure status post surgery. Reintubated status post surgery. Suspect multifactorial including obstructive sleep apnea, obesity hypoventilation syndrome, narcotics, benzodiazepines given during surgery. Stable.  2. Perianal abscess with fever. Status post incision and drainage 3/29. Normal lactic acid. 3. DM, stable. 4. ESRD on HD MWF. CT findings suggestive of bilateral polycystic kidney disease. Management per nephrology.  5. PAF, atrial flutter s/p ablation 05/2014, 08/2014; reports in SR since. EKG shows sinus rhythm. Patient not on anticoagulation. 6. Chronic systolic congestive heart failure LVEF 35-40 percent, normal wall motion 06/18/2014. Appears compensated. 7. OSA, obesity hypoventilation syndrome 8. Aortoiliac atherosclerosis.   Overall appears stable. Pulmonology planning extubation after HD if doing well.  Continue empiric abx given patient has DM and ESRD on hemodialysis  Code Status: full code DVT prophylaxis: SCDs Family Communication: none present Disposition Plan: home  [name], MD  [facility]  Pager [phone] If 7PM-7AM, please contact night-coverage at [email or web address], password [facility] 03/19/2015, 8:41 AM  LOS: 2 days   Consultants:  General surgery  Nephrology  Pulmonology  Procedures:  ETT 3/29 >>  Antibiotics:  Ciprofloxacin 3/28 >>  Flagyl 3/28 >>    HPI/Subjective: Intubated, but follows commands, feels like breathing ok, no pain, no n/v.  Objective: Filed Vitals:   03/19/15 0500 03/19/15 0600 03/19/15 0700 03/19/15 0800  BP: 133/62 133/47 121/44 140/48  Pulse: 55 55 58 77  Temp:      TempSrc:      Resp: 15 15 17 10   Height:      Weight:      SpO2: 99% 97% 99% 100%    Intake/Output Summary (Last 24 hours) at 03/19/15 0841 Last data filed at 03/19/15 0800  Gross per 24 hour  Intake 2522.17 ml  Output    100 ml  Net 2422.17 ml     Filed Weights   03/17/15 1717 03/19/15 0400  Weight: 114.76 kg (253 lb) 112.9 kg (248 lb 14.4 oz)    Exam:     Afebrile 24 hours, vital signs stable. 100% SPO2 on FiO2 40%. General:  Appears calm and comfortable Eyes: pupils appear normal ENT: grossly normal hearing, intubted Cardiovascular: RRR, no m/r/g. No LE edema. Telemetry: SR, no arrhythmias  Respiratory: CTA bilaterally, no w/r/r. Normal respiratory effort. Musculoskeletal: grossly normal tone BUE/BLE. Moves all extremities to command. Psychiatric: appears grossly normal but limited exam Neurologic: grossly non-focal.  Data Reviewed:  I/O +2.4 L since admission  No laboratory studies today. Yesterday CMP was consistent with end-stage renal disease.  No laboratory studies today. Yesterday CMP was consistent with end-stage renal disease, potassium was normal. WBC was normal yesterday. Hemoglobin 12.2.  CT abdomen and pelvis 3/28:4.2 x 5.2 perirectal thick-walled fluid collection suspicious for perirectal abscess. Bilateral polycystic kidney disease. Aortoiliac atherosclerosis without aneurysm.  CXR 3/30: mild persistent bibasilar atelectasis.  Pending data:  Blood cultures  Wound culture  Scheduled Meds: . antiseptic oral rinse  7 mL Mouth Rinse QID  . chlorhexidine  15 mL Mouth Rinse BID  . cinacalcet  90 mg Oral Q  breakfast  . ciprofloxacin  400 mg Intravenous Q24H  . gabapentin  100 mg Oral BID  .  gemfibrozil  600 mg Oral BID  . insulin aspart  0-15 Units Subcutaneous TID WC  . insulin aspart  0-5 Units Subcutaneous QHS  . loratadine  10 mg Oral Daily  . multivitamin   Oral Daily  . pantoprazole  40 mg Oral Daily  . sevelamer carbonate  2,400 mg Oral With snacks  . sevelamer carbonate  4,800 mg Oral TID WC  . sodium chloride  3 mL Intravenous Q12H  . sodium chloride  3 mL Intravenous Q12H   Continuous Infusions: . sodium chloride 50 mL/hr at 03/19/15 0800    Principal Problem:   Acute respiratory failure with hypercapnia Active Problems:   DM type 2 causing ESRD   Gout   Obesity (BMI 30-39.9)   Essential hypertension   ESRD   GERD (gastroesophageal reflux disease)   Perirectal abscess   History of atrial fibrillation   Time spent 25 minutes

## 2015-03-19 NOTE — Progress Notes (Signed)
Albert Lewis  MRN: [id number]  DOB/AGE: Nov 24, 1951 64 y.o.  Primary Ider, MD  Admit date: 03/17/2015  Chief Complaint:  Chief Complaint  Patient presents with  . Fever    S-Pt presented on  03/17/2015 with  Chief Complaint  Patient presents with  . Fever  .      Pt offers no new complaints.   Pt refusing to go on Hd.   I had extensive discussion with pt about need for HD and pt agreed .  Meds . antiseptic oral rinse  7 mL Mouth Rinse QID  . chlorhexidine  15 mL Mouth Rinse BID  . cinacalcet  90 mg Oral Q breakfast  . ciprofloxacin  400 mg Intravenous Q24H  . gabapentin  100 mg Oral BID  . gemfibrozil  600 mg Oral BID  . insulin aspart  0-15 Units Subcutaneous TID WC  . insulin aspart  0-5 Units Subcutaneous QHS  . loratadine  10 mg Oral Daily  . multivitamin   Oral Daily  . pantoprazole  40 mg Oral Daily  . sevelamer carbonate  2,400 mg Oral With snacks  . sevelamer carbonate  4,800 mg Oral TID WC  . sodium chloride  3 mL Intravenous Q12H  . sodium chloride  3 mL Intravenous Q12H      Physical Exam: Vital signs in last 24 hours: Temp:  [98.1 F (36.7 C)-98.7 F (37.1 C)] 98.1 F (36.7 C) (03/30 0400) Pulse Rate:  [55-124] 77 (03/30 0800) Resp:  [8-23] 10 (03/30 0800) BP: (88-171)/(38-98) 140/48 mmHg (03/30 0800) SpO2:  [91 %-100 %] 100 % (03/30 0800) FiO2 (%):  [40 %] 40 % (03/30 0735) Weight:  [248 lb 14.4 oz (112.9 kg)] 248 lb 14.4 oz (112.9 kg) (03/30 0400) Weight change: -4 lb 1.6 oz (-1.86 kg)    Intake/Output from previous day: 03/29 0701 - 03/30 0700 In: 2342.2 [I.V.:2142.2; IV Piggyback:200] Out: 100 [Emesis/NG output:100] Total I/O In: 180 [I.V.:180] Out: -    Physical Exam: General- pt is awake,follow commands Resp-ET tube in situ,Rhonchi+ CVS- S1S2 regular in rate and rhythm GIT- BS+, soft, NT, ND EXT- NO LE Edema, Cyanosis Access-Left AVF +  Lab Results: CBC  Recent Labs  03/17/15 1735  03/18/15 0552  WBC 12.4* 10.4  HGB 13.6 12.2*  HCT 41.3 37.9*  PLT 176 170    BMET  Recent Labs  03/17/15 1735 03/18/15 0552  NA 136 137  K 3.9 4.0  CL 96 95*  CO2 27 28  GLUCOSE 110* 70  BUN 21 25*  CREATININE 8.70* 9.81*  CALCIUM 8.6 8.5    MICRO Recent Results (from the past 240 hour(s))  Culture, blood (routine x 2)     Status: None (Preliminary result)   Collection Time: 03/17/15  5:36 PM  Result Value Ref Range Status   Specimen Description BLOOD RIGHT HAND  Final   Special Requests BOTTLES DRAWN AEROBIC AND ANAEROBIC 6CC  Final   Culture PENDING  Incomplete   Report Status PENDING  Incomplete  Culture, blood (routine x 2)     Status: None (Preliminary result)   Collection Time: 03/17/15  9:45 PM  Result Value Ref Range Status   Specimen Description BLOOD RIGHT HAND  Final   Special Requests BOTTLES DRAWN AEROBIC AND ANAEROBIC [facility]  Final   Culture PENDING  Incomplete   Report Status PENDING  Incomplete  Surgical pcr screen     Status: None   Collection Time: 03/18/15 10:45 AM  Result Value Ref Range Status   MRSA, PCR NEGATIVE NEGATIVE Final   Staphylococcus aureus NEGATIVE NEGATIVE Final    Comment:        The Xpert SA Assay (FDA approved for NASAL specimens in patients over 61 years of age), is one component of a comprehensive surveillance program.  Test performance has been validated by [facility] for patients greater than or equal to 58 year old. It is not intended to diagnose infection nor to guide or monitor treatment.   Anaerobic culture     Status: None (Preliminary result)   Collection Time: 03/18/15  1:30 PM  Result Value Ref Range Status   Specimen Description PERIRECTAL  Final   Special Requests NONE  Final   Gram Stain   Final    FEW WBC PRESENT, PREDOMINANTLY PMN NO SQUAMOUS EPITHELIAL CELLS SEEN FEW GRAM POSITIVE COCCI IN PAIRS Performed at [facility]    Culture PENDING  Incomplete   Report Status PENDING   Incomplete  Culture, routine-abscess     Status: None (Preliminary result)   Collection Time: 03/18/15  1:30 PM  Result Value Ref Range Status   Specimen Description PERIRECTAL  Final   Special Requests NONE  Final   Gram Stain   Final    FEW WBC PRESENT, PREDOMINANTLY PMN NO SQUAMOUS EPITHELIAL CELLS SEEN FEW GRAM POSITIVE COCCI IN PAIRS Performed at [facility]    Culture   Final    Culture reincubated for better growth Performed at [facility]    Report Status PENDING  Incomplete      Lab Results  Component Value Date   CALCIUM 8.5 03/18/2015   PHOS 7.4* 06/19/2014    Impression: 1)Renal  ESRD on HD On MWF schedule Pt to be dialyzed today  2)HTN BP stable    3)Anemia HGb at goal (9--11) NO need of EPO yet.  4)CKD Mineral-Bone Disorder Secondary Hyperparathyroidism present        On Cinacalcet Phosphorus not  at goal.      On Binders  5)Afib- s/p ablation  Primary MD following  6)Electrolytes Normokalemic NOrmonatremic   7)Acid base Co2 at goal   8) ID- admitted with perirectal abscess. S/p I & D On Iv Cipro + metronidazole Primary team and Surgery following  9) Resp- intubated   Pulmonary following.    Plan:  Will dialyze today.       Paralee Pendergrass S 03/19/2015, 9:06 AM

## 2015-03-19 NOTE — Progress Notes (Signed)
Subjective: He has begun weaning process. He looks good. He is awake and able to motion answers to questions. He says he is not short of breath  Objective: Vital signs in last 24 hours: Temp:  [98.1 F (36.7 C)-98.7 F (37.1 C)] 98.1 F (36.7 C) (03/30 0400) Pulse Rate:  [55-124] 55 (03/30 0600) Resp:  [8-23] 15 (03/30 0600) BP: (88-171)/(38-98) 133/47 mmHg (03/30 0600) SpO2:  [91 %-100 %] 97 % (03/30 0600) FiO2 (%):  [40 %] 40 % (03/30 0735) Weight:  [112.9 kg (248 lb 14.4 oz)] 112.9 kg (248 lb 14.4 oz) (03/30 0400) Weight change: -1.86 kg (-4 lb 1.6 oz)    Intake/Output from previous day: 03/29 0701 - 03/30 0700 In: 2342.2 [I.V.:2142.2; IV Piggyback:200] Out: 100 [Emesis/NG output:100]  PHYSICAL EXAM General appearance: alert, cooperative, no distress and Intubated and on mechanical ventilation Resp: clear to auscultation bilaterally Cardio: regular rate and rhythm, S1, S2 normal, no murmur, click, rub or gallop GI: soft, non-tender; bowel sounds normal; no masses,  no organomegaly Extremities: extremities normal, atraumatic, no cyanosis or edema  Lab Results:  Results for orders placed or performed during the hospital encounter of 03/17/15 (from the past 48 hour(s))  Comprehensive metabolic panel     Status: Abnormal   Collection Time: 03/17/15  5:35 PM  Result Value Ref Range   Sodium 136 135 - 145 mmol/L   Potassium 3.9 3.5 - 5.1 mmol/L   Chloride 96 96 - 112 mmol/L   CO2 27 19 - 32 mmol/L   Glucose, Bld 110 (H) 70 - 99 mg/dL   BUN 21 6 - 23 mg/dL   Creatinine, Ser 8.70 (H) 0.50 - 1.35 mg/dL   Calcium 8.6 8.4 - 10.5 mg/dL   Total Protein 7.0 6.0 - 8.3 g/dL   Albumin 3.1 (L) 3.5 - 5.2 g/dL   AST 34 0 - 37 U/L   ALT 32 0 - 53 U/L   Alkaline Phosphatase 72 39 - 117 U/L   Total Bilirubin 0.6 0.3 - 1.2 mg/dL   GFR calc non Af Amer 6 (L) >90 mL/min   GFR calc Af Amer 7 (L) >90 mL/min    Comment: (NOTE) The eGFR has been calculated using the CKD EPI equation. This  calculation has not been validated in all clinical situations. eGFR's persistently <90 mL/min signify possible Chronic Kidney Disease.    Anion gap 13 5 - 15  CBC with Differential     Status: Abnormal   Collection Time: 03/17/15  5:35 PM  Result Value Ref Range   WBC 12.4 (H) 4.0 - 10.5 K/uL   RBC 4.30 4.22 - 5.81 MIL/uL   Hemoglobin 13.6 13.0 - 17.0 g/dL   HCT 41.3 39.0 - 52.0 %   MCV 96.0 78.0 - 100.0 fL   MCH 31.6 26.0 - 34.0 pg   MCHC 32.9 30.0 - 36.0 g/dL   RDW 14.0 11.5 - 15.5 %   Platelets 176 150 - 400 K/uL   Neutrophils Relative % 82 (H) 43 - 77 %   Neutro Abs 10.2 (H) 1.7 - 7.7 K/uL   Lymphocytes Relative 8 (L) 12 - 46 %   Lymphs Abs 1.1 0.7 - 4.0 K/uL   Monocytes Relative 10 3 - 12 %   Monocytes Absolute 1.2 (H) 0.1 - 1.0 K/uL   Eosinophils Relative 0 0 - 5 %   Eosinophils Absolute 0.0 0.0 - 0.7 K/uL   Basophils Relative 0 0 - 1 %   Basophils Absolute  0.0 0.0 - 0.1 K/uL  Lactic acid, plasma     Status: None   Collection Time: 03/17/15  5:35 PM  Result Value Ref Range   Lactic Acid, Venous 1.9 0.5 - 2.0 mmol/L  Culture, blood (routine x 2)     Status: None (Preliminary result)   Collection Time: 03/17/15  5:36 PM  Result Value Ref Range   Specimen Description BLOOD RIGHT HAND    Special Requests BOTTLES DRAWN AEROBIC AND ANAEROBIC 6CC    Culture PENDING    Report Status PENDING   Lactic acid, plasma     Status: None   Collection Time: 03/17/15  8:18 PM  Result Value Ref Range   Lactic Acid, Venous 1.0 0.5 - 2.0 mmol/L  Culture, blood (routine x 2)     Status: None (Preliminary result)   Collection Time: 03/17/15  9:45 PM  Result Value Ref Range   Specimen Description BLOOD RIGHT HAND    Special Requests BOTTLES DRAWN AEROBIC AND ANAEROBIC 6CC    Culture PENDING    Report Status PENDING   Glucose, capillary     Status: None   Collection Time: 03/18/15  1:05 AM  Result Value Ref Range   Glucose-Capillary 86 70 - 99 mg/dL   Comment 1 Notify RN   APTT      Status: None   Collection Time: 03/18/15  5:52 AM  Result Value Ref Range   aPTT 37 24 - 37 seconds    Comment:        IF BASELINE aPTT IS ELEVATED, SUGGEST PATIENT RISK ASSESSMENT BE USED TO DETERMINE APPROPRIATE ANTICOAGULANT THERAPY.   Protime-INR     Status: Abnormal   Collection Time: 03/18/15  5:52 AM  Result Value Ref Range   Prothrombin Time 15.4 (H) 11.6 - 15.2 seconds   INR 1.21 0.00 - 1.49  CBC     Status: Abnormal   Collection Time: 03/18/15  5:52 AM  Result Value Ref Range   WBC 10.4 4.0 - 10.5 K/uL   RBC 3.96 (L) 4.22 - 5.81 MIL/uL   Hemoglobin 12.2 (L) 13.0 - 17.0 g/dL   HCT 37.9 (L) 39.0 - 52.0 %   MCV 95.7 78.0 - 100.0 fL   MCH 30.8 26.0 - 34.0 pg   MCHC 32.2 30.0 - 36.0 g/dL   RDW 13.9 11.5 - 15.5 %   Platelets 170 150 - 400 K/uL  Comprehensive metabolic panel     Status: Abnormal   Collection Time: 03/18/15  5:52 AM  Result Value Ref Range   Sodium 137 135 - 145 mmol/L   Potassium 4.0 3.5 - 5.1 mmol/L   Chloride 95 (L) 96 - 112 mmol/L   CO2 28 19 - 32 mmol/L   Glucose, Bld 70 70 - 99 mg/dL   BUN 25 (H) 6 - 23 mg/dL   Creatinine, Ser 9.81 (H) 0.50 - 1.35 mg/dL   Calcium 8.5 8.4 - 10.5 mg/dL   Total Protein 6.4 6.0 - 8.3 g/dL   Albumin 2.9 (L) 3.5 - 5.2 g/dL   AST 44 (H) 0 - 37 U/L   ALT 25 0 - 53 U/L   Alkaline Phosphatase 69 39 - 117 U/L   Total Bilirubin 0.6 0.3 - 1.2 mg/dL   GFR calc non Af Amer 5 (L) >90 mL/min   GFR calc Af Amer 6 (L) >90 mL/min    Comment: (NOTE) The eGFR has been calculated using the CKD EPI equation. This calculation has not been  validated in all clinical situations. eGFR's persistently <90 mL/min signify possible Chronic Kidney Disease.    Anion gap 14 5 - 15  Glucose, capillary     Status: None   Collection Time: 03/18/15  7:51 AM  Result Value Ref Range   Glucose-Capillary 78 70 - 99 mg/dL  Glucose, capillary     Status: None   Collection Time: 03/18/15 10:29 AM  Result Value Ref Range   Glucose-Capillary 72 70  - 99 mg/dL  Surgical pcr screen     Status: None   Collection Time: 03/18/15 10:45 AM  Result Value Ref Range   MRSA, PCR NEGATIVE NEGATIVE   Staphylococcus aureus NEGATIVE NEGATIVE    Comment:        The Xpert SA Assay (FDA approved for NASAL specimens in patients over 84 years of age), is one component of a comprehensive surveillance program.  Test performance has been validated by [facility], [place] for patients greater than or equal to 58 year old. It is not intended to diagnose infection nor to guide or monitor treatment.   Glucose, capillary     Status: None   Collection Time: 03/18/15 12:21 PM  Result Value Ref Range   Glucose-Capillary 82 70 - 99 mg/dL  Anaerobic culture     Status: None (Preliminary result)   Collection Time: 03/18/15  1:30 PM  Result Value Ref Range   Specimen Description PERIRECTAL    Special Requests NONE    Gram Stain PENDING    Culture PENDING    Report Status PENDING   Glucose, capillary     Status: Abnormal   Collection Time: 03/18/15  1:57 PM  Result Value Ref Range   Glucose-Capillary 126 (H) 70 - 99 mg/dL  Draw ABG 1 hour after initiation of ventilator     Status: Abnormal   Collection Time: 03/18/15  3:39 PM  Result Value Ref Range   FIO2 40.00 %   Delivery systems VENTILATOR    Mode PRESSURE REGULATED VOLUME CONTROL    VT 650 mL   Rate 15 resp/min   Peep/cpap 5.0 cm H20   pH, Arterial 7.317 (L) 7.350 - 7.450   pCO2 arterial 49.1 (H) 35.0 - 45.0 mmHg   pO2, Arterial 73.0 (L) 80.0 - 100.0 mmHg   Bicarbonate 24.4 (H) 20.0 - 24.0 mEq/L   TCO2 22.0 0 - 100 mmol/L   Acid-base deficit 1.0 0.0 - 2.0 mmol/L   O2 Saturation 92.1 %   Patient temperature 37.0    Collection site RIGHT BRACHIAL    Drawn by COLLECTED BY RT    Sample type ARTERIAL    Allens test (pass/fail) PASS PASS    ABGS  Recent Labs  03/18/15 1539  PHART 7.317*  PO2ART 73.0*  TCO2 22.0  HCO3 24.4*   CULTURES Recent Results (from the past 240 hour(s))  Culture,  blood (routine x 2)     Status: None (Preliminary result)   Collection Time: 03/17/15  5:36 PM  Result Value Ref Range Status   Specimen Description BLOOD RIGHT HAND  Final   Special Requests BOTTLES DRAWN AEROBIC AND ANAEROBIC 6CC  Final   Culture PENDING  Incomplete   Report Status PENDING  Incomplete  Culture, blood (routine x 2)     Status: None (Preliminary result)   Collection Time: 03/17/15  9:45 PM  Result Value Ref Range Status   Specimen Description BLOOD RIGHT HAND  Final   Special Requests BOTTLES DRAWN AEROBIC AND ANAEROBIC 6CC  Final  Culture PENDING  Incomplete   Report Status PENDING  Incomplete  Surgical pcr screen     Status: None   Collection Time: 03/18/15 10:45 AM  Result Value Ref Range Status   MRSA, PCR NEGATIVE NEGATIVE Final   Staphylococcus aureus NEGATIVE NEGATIVE Final    Comment:        The Xpert SA Assay (FDA approved for NASAL specimens in patients over 21 years of age), is one component of a comprehensive surveillance program.  Test performance has been validated by [facility] for patients greater than or equal to 36 year old. It is not intended to diagnose infection nor to guide or monitor treatment.   Anaerobic culture     Status: None (Preliminary result)   Collection Time: 03/18/15  1:30 PM  Result Value Ref Range Status   Specimen Description PERIRECTAL  Final   Special Requests NONE  Final   Gram Stain PENDING  Incomplete   Culture PENDING  Incomplete   Report Status PENDING  Incomplete   Studies/Results: Ct Abdomen Pelvis Wo Contrast  03/17/2015   CLINICAL DATA:  Acute rectal pain, fever, diarrhea .  EXAM: CT ABDOMEN AND PELVIS WITHOUT CONTRAST  TECHNIQUE: Multidetector CT imaging of the abdomen and pelvis was performed following the standard protocol without IV contrast.  COMPARISON:  04/10/2012, 01/21/2012  FINDINGS: Lower chest: Minor scattered basilar atelectasis. Normal heart size. No pericardial or pleural effusion. Small  hiatal hernia suspected. Degenerative changes of the lower thoracic spine.  Abdomen: Gallbladder is not visualized. Liver, biliary system, pancreas, and spleen within normal limits for age and noncontrast imaging.  Kidneys demonstrate extensive polycystic kidney disease. Several cysts demonstrate hyper attenuation compatible with protein or hemorrhage. Several cysts are also calcified. No renal obstruction or hydronephrosis. Stable mild thickening of the adrenal glands bilaterally, suspect hyperplasia.  Negative for bowel obstruction, dilatation, ileus, or free air. Postop changes of the abdominal wall. No ventral hernia evident.  Normal appendix in right lower quadrant.  No abdominal free fluid, fluid collection, hemorrhage, abscess, or adenopathy.  Aortic iliac atherosclerosis noted without aneurysm or retroperitoneal hemorrhage.  Pelvis: Colonic diverticulosis noted diffusely. No pelvic free fluid, fluid collection, hemorrhage, or adenopathy. Vascular calcifications noted. Small fat containing inguinal hernias bilaterally. In the rectal region, there is a left perirectal low-attenuation area extending into the gluteal soft tissues. This roughly measures 4.2 x 2.5 cm, image 15 series 3. This is ill-defined by noncontrast imaging but suspicious for a perirectal abscess.  Diffuse degenerative changes of the visualized spine, SI joints and hips. No acute osseous finding.  IMPRESSION: 4.2 x 5.2 cm right perirectal thick-walled hypodense fluid collection by noncontrast imaging, suspect perirectal abscess.  Bilateral polycystic kidney disease.  Aortoiliac atherosclerosis without aneurysm  Colonic diverticulosis  Normal appendix  Fat containing inguinal hernias bilaterally   Electronically Signed   By: Jerilynn Mages.  Shick M.D.   On: 03/17/2015 20:56   Dg Chest 2 View  03/17/2015   CLINICAL DATA:  Acute fever, congestion cough  EXAM: CHEST  2 VIEW  COMPARISON:  07/15/2014  FINDINGS: Mild hyperinflation without focal pneumonia,  collapse or consolidation. Negative for edema, effusion, or pneumothorax. Trachea midline. Normal heart size and vascularity. Degenerative changes of the spine diffusely. The trachea is midline. No acute osseous finding.  IMPRESSION: No acute chest process.   Electronically Signed   By: Jerilynn Mages.  Shick M.D.   On: 03/17/2015 20:23   Dg Chest Port 1 View  03/18/2015   CLINICAL DATA:  Evaluate endotracheal  tube position  EXAM: PORTABLE CHEST - 1 VIEW  COMPARISON:  03/17/2015  FINDINGS: Endotracheal tube tip is 4 cm above the carinal. NG tube crosses the gastroesophageal junction with tip not visualized. Mild to moderate bilateral lower lobe atelectasis.  IMPRESSION: Support devices as described.  Bibasilar atelectasis.   Electronically Signed   By: Skipper Cliche M.D.   On: 03/18/2015 16:48    Medications:  Prior to Admission:  Prescriptions prior to admission  Medication Sig Dispense Refill Last Dose  . acetaminophen (TYLENOL) 325 MG tablet Take 650 mg by mouth every 6 (six) hours as needed for moderate pain.   03/17/2015 at Unknown time  . albuterol (PROVENTIL HFA;VENTOLIN HFA) 108 (90 BASE) MCG/ACT inhaler Inhale 2 puffs into the lungs every 6 (six) hours as needed for wheezing. 1 Inhaler 2 Past Week at Unknown time  . albuterol (PROVENTIL) (2.5 MG/3ML) 0.083% nebulizer solution Take 3 mLs (2.5 mg total) by nebulization every 6 (six) hours as needed for wheezing or shortness of breath. 75 mL 0 Past Week at Unknown time  . allopurinol (ZYLOPRIM) 100 MG tablet Take 100 mg by mouth daily.    03/16/2015 at Unknown time  . B Complex-C-Folic Acid (RENAL MULTIVITAMIN FORMULA PO) Take 1 tablet by mouth daily.   03/16/2015 at Unknown time  . cetirizine (ZYRTEC) 10 MG tablet Take 10 mg by mouth daily.    03/16/2015 at Unknown time  . cinacalcet (SENSIPAR) 90 MG tablet Take 90 mg by mouth daily.   03/16/2015 at Unknown time  . docusate sodium (COLACE) 100 MG capsule Take 200 mg by mouth 2 (two) times daily as needed for  moderate constipation.    Past Week at Unknown time  . fish oil-omega-3 fatty acids 1000 MG capsule Take 2 g by mouth 3 (three) times daily. SUPER OMEGA 3 FORMULA: EPA 300/DHA20   03/16/2015 at Unknown time  . gabapentin (NEURONTIN) 100 MG capsule Take 1 capsule (100 mg total) by mouth 2 (two) times daily. 60 capsule 3 03/16/2015 at Unknown time  . gemfibrozil (LOPID) 600 MG tablet Take 600 mg by mouth 2 (two) times daily.   03/16/2015 at Unknown time  . HYDROcodone-acetaminophen (NORCO/VICODIN) 5-325 MG per tablet Take 2 tablets by mouth every 4 (four) hours as needed. (Patient taking differently: Take 2 tablets by mouth every 4 (four) hours as needed for moderate pain or severe pain. ) 10 tablet 0 03/16/2015 at Unknown time  . hydrocortisone (ANUSOL-HC) 25 MG suppository Place 1 suppository (25 mg total) rectally 2 (two) times daily. 12 suppository 0 03/16/2015 at Unknown time  . insulin glargine (LANTUS) 100 UNIT/ML injection Inject 22 Units into the skin at bedtime.    03/16/2015 at Unknown time  . ipratropium (ATROVENT) 0.02 % nebulizer solution Take 2.5 mLs (0.5 mg total) by nebulization 4 (four) times daily. 75 mL 0 Past Week at Unknown time  . pantoprazole (PROTONIX) 40 MG tablet Take 40 mg by mouth daily.   03/16/2015 at Unknown time  . sevelamer (RENVELA) 800 MG tablet Take 2,400-4,800 mg by mouth See admin instructions. Takes 6 tablets (4800 mg) three times daily with meals and 3 tablets (2400 mg) with snack.   03/16/2015 at Unknown time  . magnesium citrate SOLN Take 148 mLs (0.5 Bottles total) by mouth once. 1/2 bottle today. One half bottle tomorrow if still constipated (Patient not taking: Reported on 03/17/2015) 195 mL 0 Taking  . PRESCRIPTION MEDICATION Place 1 drop into both eyes 4 (four) times daily.  For dry eyes   Taking   Scheduled: . antiseptic oral rinse  7 mL Mouth Rinse QID  . chlorhexidine  15 mL Mouth Rinse BID  . cinacalcet  90 mg Oral Q breakfast  . ciprofloxacin  400 mg  Intravenous Q24H  . gabapentin  100 mg Oral BID  . gemfibrozil  600 mg Oral BID  . insulin aspart  0-15 Units Subcutaneous TID WC  . insulin aspart  0-5 Units Subcutaneous QHS  . loratadine  10 mg Oral Daily  . multivitamin   Oral Daily  . pantoprazole  40 mg Oral Daily  . sevelamer carbonate  2,400 mg Oral With snacks  . sevelamer carbonate  4,800 mg Oral TID WC  . sodium chloride  3 mL Intravenous Q12H  . sodium chloride  3 mL Intravenous Q12H   Continuous: . sodium chloride 50 mL/hr at 03/19/15 0600   GWL:TKCXWN chloride, fentaNYL, fentaNYL, HYDROcodone-acetaminophen, metronidazole, ondansetron **OR** ondansetron (ZOFRAN) IV, sodium chloride  Assesment: He has respiratory failure with hypercapnia that occurred after he had surgery for a perirectal abscess. He is doing well on weaning protocol. There was some discussion yesterday about whether he would need to be dialyzed before he was extubated because of concerns that he might be volume overloaded.  He has end-stage renal disease on dialysis.  He had a perirectal abscess and has had surgery. Principal Problem:   Perirectal abscess Active Problems:   DM type 2 causing ESRD   Gout   Obesity (BMI 30-39.9)   Essential hypertension   ESRD   GERD (gastroesophageal reflux disease)   History of atrial fibrillation   Acute respiratory failure with hypercapnia    Plan: Check a chest x-ray this morning. If it doesn't show evidence of volume overload/pulmonary edema I think he is ready for extubation    LOS: 2 days   Bart Ashford L 03/19/2015, 7:57 AM

## 2015-03-19 NOTE — Care Management Utilization Note (Signed)
UR completed 

## 2015-03-19 NOTE — Progress Notes (Signed)
1 Day Post-Op  Subjective: Intubated but alert.  Objective: Vital signs in last 24 hours: Temp:  [98.1 F (36.7 C)-98.7 F (37.1 C)] 98.1 F (36.7 C) (03/30 0945) Pulse Rate:  [55-124] 62 (03/30 1000) Resp:  [8-23] 11 (03/30 1000) BP: (88-171)/(38-98) 142/58 mmHg (03/30 1000) SpO2:  [91 %-100 %] 99 % (03/30 0945) FiO2 (%):  [40 %] 40 % (03/30 0735) Weight:  [112.9 kg (248 lb 14.4 oz)] 112.9 kg (248 lb 14.4 oz) (03/30 0945)    Intake/Output from previous day: 03/29 0701 - 03/30 0700 In: 2342.2 [I.V.:2142.2; IV Piggyback:200] Out: 100 [Emesis/NG output:100] Intake/Output this shift: Total I/O In: 180 [I.V.:180] Out: -   Rectal: Packing removed. No significant purulent drainage noted.  Lab Results:   Recent Labs  03/18/15 0552 03/19/15 0945  WBC 10.4 12.5*  HGB 12.2* 11.6*  HCT 37.9* 35.5*  PLT 170 162   BMET  Recent Labs  03/17/15 1735 03/18/15 0552  NA 136 137  K 3.9 4.0  CL 96 95*  CO2 27 28  GLUCOSE 110* 70  BUN 21 25*  CREATININE 8.70* 9.81*  CALCIUM 8.6 8.5   PT/INR  Recent Labs  03/18/15 0552  LABPROT 15.4*  INR 1.21    Studies/Results: Ct Abdomen Pelvis Wo Contrast  03/17/2015   CLINICAL DATA:  Acute rectal pain, fever, diarrhea .  EXAM: CT ABDOMEN AND PELVIS WITHOUT CONTRAST  TECHNIQUE: Multidetector CT imaging of the abdomen and pelvis was performed following the standard protocol without IV contrast.  COMPARISON:  04/10/2012, 01/21/2012  FINDINGS: Lower chest: Minor scattered basilar atelectasis. Normal heart size. No pericardial or pleural effusion. Small hiatal hernia suspected. Degenerative changes of the lower thoracic spine.  Abdomen: Gallbladder is not visualized. Liver, biliary system, pancreas, and spleen within normal limits for age and noncontrast imaging.  Kidneys demonstrate extensive polycystic kidney disease. Several cysts demonstrate hyper attenuation compatible with protein or hemorrhage. Several cysts are also calcified. No  renal obstruction or hydronephrosis. Stable mild thickening of the adrenal glands bilaterally, suspect hyperplasia.  Negative for bowel obstruction, dilatation, ileus, or free air. Postop changes of the abdominal wall. No ventral hernia evident.  Normal appendix in right lower quadrant.  No abdominal free fluid, fluid collection, hemorrhage, abscess, or adenopathy.  Aortic iliac atherosclerosis noted without aneurysm or retroperitoneal hemorrhage.  Pelvis: Colonic diverticulosis noted diffusely. No pelvic free fluid, fluid collection, hemorrhage, or adenopathy. Vascular calcifications noted. Small fat containing inguinal hernias bilaterally. In the rectal region, there is a left perirectal low-attenuation area extending into the gluteal soft tissues. This roughly measures 4.2 x 2.5 cm, image 15 series 3. This is ill-defined by noncontrast imaging but suspicious for a perirectal abscess.  Diffuse degenerative changes of the visualized spine, SI joints and hips. No acute osseous finding.  IMPRESSION: 4.2 x 5.2 cm right perirectal thick-walled hypodense fluid collection by noncontrast imaging, suspect perirectal abscess.  Bilateral polycystic kidney disease.  Aortoiliac atherosclerosis without aneurysm  Colonic diverticulosis  Normal appendix  Fat containing inguinal hernias bilaterally   Electronically Signed   By: Jerilynn Mages.  Shick M.D.   On: 03/17/2015 20:56   Dg Chest 2 View  03/17/2015   CLINICAL DATA:  Acute fever, congestion cough  EXAM: CHEST  2 VIEW  COMPARISON:  07/15/2014  FINDINGS: Mild hyperinflation without focal pneumonia, collapse or consolidation. Negative for edema, effusion, or pneumothorax. Trachea midline. Normal heart size and vascularity. Degenerative changes of the spine diffusely. The trachea is midline. No acute osseous finding.  IMPRESSION:  No acute chest process.   Electronically Signed   By: Jerilynn Mages.  Shick M.D.   On: 03/17/2015 20:23   Dg Chest Port 1 View  03/19/2015   CLINICAL DATA:  Respiratory  failure.  EXAM: PORTABLE CHEST - 1 VIEW  COMPARISON:  03/18/2015 .  FINDINGS: Endotracheal tube and NG tube in stable position. Stable cardiomegaly. Persistent mild bibasilar atelectasis. No pleural effusion or pneumothorax. No acute osseus abnormality .  IMPRESSION: 1. Lines and tubes in stable position. 2. Persistent cardiomegaly. 3. Mild persist bibasilar atelectasis.   Electronically Signed   By: Marcello Moores  Register   On: 03/19/2015 09:32   Dg Chest Port 1 View  03/18/2015   CLINICAL DATA:  Evaluate endotracheal tube position  EXAM: PORTABLE CHEST - 1 VIEW  COMPARISON:  03/17/2015  FINDINGS: Endotracheal tube tip is 4 cm above the carinal. NG tube crosses the gastroesophageal junction with tip not visualized. Mild to moderate bilateral lower lobe atelectasis.  IMPRESSION: Support devices as described.  Bibasilar atelectasis.   Electronically Signed   By: Skipper Cliche M.D.   On: 03/18/2015 16:48    Anti-infectives: Anti-infectives    Start     Dose/Rate Route Frequency Ordered Stop   03/18/15 2200  ciprofloxacin (CIPRO) IVPB 400 mg     400 mg 200 mL/hr over 60 Minutes Intravenous Every 24 hours 03/17/15 2129     03/17/15 2220  metroNIDAZOLE (FLAGYL) IVPB 500 mg     500 mg 100 mL/hr over 60 Minutes Intravenous Every Dialysis 03/17/15 2221     03/17/15 2130  ciprofloxacin (CIPRO) IVPB 400 mg     400 mg 200 mL/hr over 60 Minutes Intravenous  Once 03/17/15 2118 03/18/15 0212   03/17/15 2115  metroNIDAZOLE (FLAGYL) IVPB 500 mg     500 mg 100 mL/hr over 60 Minutes Intravenous  Once 03/17/15 2111 03/17/15 2252      Assessment/Plan: s/p Procedure(s): INCISION AND DRAINAGE ABSCESS Impression: Patient is weaning well off ventilator. Will start sitz baths once patient is extubated and can tolerate transfers.  LOS: 2 days    Shuayb Schepers A 03/19/2015

## 2015-03-19 NOTE — Procedures (Signed)
HEMODIALYSIS TREATMENT NOTE:  3.5 hour heparin-free dialysis completed via left upper arm AVF.  Goal met:  3.1 liters removed without interruption in ultrafiltration.  All blood reinfused.  4 hour treatment had been ordered but pt, who was initially refused HD treatment altogether, elected to end session 30 minutes early.  Dr. Theador Hawthorne was notified. Pt acknowledged risks and signed AMA waiver.  Report given to Loni Muse, RN.  Rockwell Alexandria, RN, CDN

## 2015-03-19 NOTE — Anesthesia Postprocedure Evaluation (Signed)
  Anesthesia Post-op Note  Patient: Albert Lewis  Procedure(s) Performed: Procedure(s): INCISION AND DRAINAGE ABSCESS (N/A)  Patient Location: ICU  Anesthesia Type:General  Level of Consciousness: awake and alert   Airway and Oxygen Therapy: Patient remains intubated per anesthesia plan and Patient placed on Ventilator (see vital sign flow sheet for setting)  Post-op Pain: none  Post-op Assessment: Post-op Vital signs reviewed, Patient's Cardiovascular Status Stable, No signs of Nausea or vomiting and Pain level controlled  Post-op Vital Signs: Reviewed and stable  Last Vitals:  Filed Vitals:   03/19/15 1045  BP: 145/54  Pulse: 71  Temp:   Resp: 13    Complications: respiratory complications Patient is currently undergoing hemodialysis, VSS, and the plan is to extubate him later today if he remains stable.  Ventilator settings on PS. Labs checked.

## 2015-03-19 NOTE — Plan of Care (Signed)
Problem: Phase I Progression Outcomes Goal: Pain controlled with appropriate interventions Outcome: Progressing PRN medications given as ordered Goal: OOB as tolerated unless otherwise ordered Outcome: Not Progressing Currently on bedrest Goal: Initial discharge plan identified Outcome: Completed/Met Date Met:  03/19/15 Home with family Goal: Voiding-avoid urinary catheter unless indicated Outcome: Completed/Met Date Met:  03/19/15 Chronic hemodialysis no longer makes urine per patient Goal: Hemodynamically stable Outcome: Progressing Vital signs stable

## 2015-03-19 NOTE — Progress Notes (Signed)
Patient was extubated after dialysis had removed 3.1 liters of fluid. Patient tolerated extubation well and I had talked to Dr. Luan Pulling around 1030 and he said it would be ok to extubate after dialysis and gave order for that.

## 2015-03-19 NOTE — Addendum Note (Signed)
Addendum  created 03/19/15 1107 by Ollen Bowl, CRNA   Modules edited: Notes Section   Notes Section:  File: [id number]

## 2015-03-19 NOTE — Procedures (Signed)
Extubation Procedure Note  Patient Details:   Name: Albert Lewis DOB: 11-03-1951 MRN: [id number]   Airway Documentation:     Evaluation  O2 sats: stable throughout Complications: No apparent complications Patient did tolerate procedure well. Bilateral Breath Sounds: Rhonchi, Diminished   Yes  Carson Myrtle 03/19/2015, 2:06 PM

## 2015-03-20 ENCOUNTER — Encounter (HOSPITAL_COMMUNITY): Payer: Self-pay | Admitting: General Surgery

## 2015-03-20 MED ORDER — METRONIDAZOLE 500 MG PO TABS: 500.0000 mg | ORAL_TABLET | Freq: Two times a day (BID) | ORAL | Status: DC

## 2015-03-20 MED ORDER — CIPROFLOXACIN HCL 250 MG PO TABS: 250.0000 mg | ORAL_TABLET | Freq: Every evening | ORAL | Status: DC

## 2015-03-20 MED ORDER — METRONIDAZOLE 500 MG PO TABS: 500.0000 mg | ORAL_TABLET | Freq: Two times a day (BID) | ORAL | Status: AC

## 2015-03-20 MED ORDER — CIPROFLOXACIN HCL 250 MG PO TABS: 250.0000 mg | ORAL_TABLET | Freq: Every evening | ORAL | Status: AC

## 2015-03-20 NOTE — Progress Notes (Addendum)
PROGRESS NOTE  Albert Lewis DPO:[id number] DOB: [birth year] DOA: 03/17/2015 PCP: Tula Nakayama, MD  Summary: 64 year old man presented with increasing rectal pain and abdominal tenderness as well as fever. Imaging revealed perirectal abscess. He was seen in consultation with general surgery and underwent incision and drainage of perianal abscess 3/29. Postoperatively he had difficulty ventilating and was subsequently reintubated. Difficulty was thought to be related to muscle relaxant and narcotic given intraoperatively.  Assessment/Plan: 1. Acute hypercapnic respiratory failure status post surgery. Resolved. Successfully extubated 3/30. (Had to be reintubated status post surgery). Suspect multifactorial including obstructive sleep apnea, obesity hypoventilation syndrome, narcotics, benzodiazepines given during surgery.  2. Perianal abscess with fever on admission. Improving. Afebrile >48 hours. Status post incision and drainage 3/29. Normal lactic acid. 3. DM, remains stable. 4. ESRD on HD MWF. CT findings suggestive of bilateral polycystic kidney disease. Management per nephrology.  5. PAF, atrial flutter s/p ablation 05/2014, 08/2014; reports in SR since. EKG shows sinus rhythm. Patient not on anticoagulation. 6. Chronic systolic congestive heart failure LVEF 35-40 percent, normal wall motion 06/18/2014. Remains stable. 7. OSA, obesity hypoventilation syndrome 8. Aortoiliac atherosclerosis.   Improving, cleared by surgery and pulmonology for discharge  Continue empiric abx, f/u culture data. Discussed dosing and schedule with pharmacist Scott--recommends cipro 250mg  daily (give after HD on HD days) and metronidazole 500 mg BID  Home  [name], MD  [facility]  Pager [phone] If 7PM-7AM, please contact night-coverage at [email or web address], password [facility] 03/20/2015, 8:39 AM  LOS: 3 days   Consultants:  General surgery  Nephrology  Pulmonology  Procedures:  ETT 3/29  >> 3/30  Antibiotics:  Ciprofloxacin 3/28 >> 4/3  Flagyl 3/28 >> 4/3  HPI/Subjective: Doing well, breathing well, eating. Pain manageable.   Objective: Filed Vitals:   03/20/15 0600 03/20/15 0700 03/20/15 0731 03/20/15 0800  BP: 116/51 130/57  126/42  Pulse: 72 69  69  Temp:   97.8 F (36.6 C)   TempSrc:   Oral   Resp: 18 15  16   Height:      Weight:      SpO2: 94% 99%  96%    Intake/Output Summary (Last 24 hours) at 03/20/15 0839 Last data filed at 03/20/15 0600  Gross per 24 hour  Intake 520.33 ml  Output   3100 ml  Net -2579.67 ml     Filed Weights   03/19/15 0400 03/19/15 0945 03/20/15 0500  Weight: 112.9 kg (248 lb 14.4 oz) 112.9 kg (248 lb 14.4 oz) 114.6 kg (252 lb 10.4 oz)    Exam:     Afebrile 48 hours, vital signs stable.   General:  Appears calm and comfortable Cardiovascular: RRR, no m/r/g. No LE edema. Telemetry: SR, no arrhythmias  Respiratory: CTA bilaterally, no w/r/r. Normal respiratory effort. Psychiatric: grossly normal mood and affect, speech fluent and appropriate  Data Reviewed:  BMP and CBC 3/30 noted  CT abdomen and pelvis 3/28:4.2 x 5.2 perirectal thick-walled fluid collection suspicious for perirectal abscess. Bilateral polycystic kidney disease. Aortoiliac atherosclerosis without aneurysm.  CXR 3/30: mild persistent bibasilar atelectasis.  Pending data:  Blood cultures  Wound culture  Scheduled Meds: . antiseptic oral rinse  7 mL Mouth Rinse QID  . chlorhexidine  15 mL Mouth Rinse BID  . cinacalcet  90 mg Oral Q breakfast  . ciprofloxacin  400 mg Intravenous Q24H  . gabapentin  100 mg Oral BID  . gemfibrozil  600 mg Oral BID  . insulin aspart  0-15 Units Subcutaneous TID  WC  . insulin aspart  0-5 Units Subcutaneous QHS  . loratadine  10 mg Oral Daily  . metronidazole  500 mg Intravenous Q12H  . multivitamin   Oral Daily  . pantoprazole  40 mg Oral Daily  . sevelamer carbonate  2,400 mg Oral With snacks  . sevelamer  carbonate  4,800 mg Oral TID WC  . sodium chloride  3 mL Intravenous Q12H  . sodium chloride  3 mL Intravenous Q12H   Continuous Infusions:    Principal Problem:   Acute respiratory failure with hypercapnia Active Problems:   DM type 2 causing ESRD   Gout   Obesity (BMI 30-39.9)   Essential hypertension   ESRD   GERD (gastroesophageal reflux disease)   Perirectal abscess   History of atrial fibrillation

## 2015-03-20 NOTE — Progress Notes (Signed)
Patient was discharged to home. Stated that he had oxygen at home. Patient need a little help getting dressed and was placed in wheel chair for discharge. Had removed iv from right hand catheter intact with no pain or discomfort to patient during removal. Patients family came to room and we aided patient to his truck passenger side for transport home. Dr. Sarajane Jews had prescriptions called to patients pharmacy and patient given information on how to care for surgical area.

## 2015-03-20 NOTE — Anesthesia Postprocedure Evaluation (Signed)
  Anesthesia Post-op Note  Patient: Albert Lewis  Procedure(s) Performed: Procedure(s): INCISION AND DRAINAGE ABSCESS (N/A)  Patient Location:ICU 5  Anesthesia Type:General  Level of Consciousness: awake, alert , oriented and patient cooperative  Airway and Oxygen Therapy: Patient Spontanous Breathing and Patient connected to nasal cannula oxygen; CPAP while sleeping;  Post-op Pain: mild  Post-op Assessment: Post-op Vital signs reviewed, Patient's Cardiovascular Status Stable, Respiratory Function Stable, Patent Airway and Pain level controlled  Post-op Vital Signs: Reviewed and stable  Last Vitals:  Filed Vitals:   03/20/15 0731  BP:   Pulse:   Temp: 36.6 C  Resp:     Complications: No apparent anesthesia complications

## 2015-03-20 NOTE — Discharge Summary (Signed)
Physician Discharge Summary  Albert Lewis ZOX:[id number] DOB: 07-Dec-1951 DOA: 03/17/2015  PCP: Tula Nakayama, MD  Admit date: 03/17/2015 Discharge date: 03/20/2015  Recommendations for Outpatient Follow-up:  1. Resolution appearing anal abscess 2. Aortoiliac atherosclerosis, asymptomatic    Follow-up Information    Follow up with Jamesetta So, MD.   Specialty:  General Surgery   Why:  As needed   Contact information:   [address] [place] [address] [phone]       Follow up with Tula Nakayama, MD.   Specialty:  Family Medicine   Why:  As needed   Contact information:   [address], [place] [address] [phone]      Discharge Diagnoses:  1. Perianal abscess with fever without evidence of sepsis 2. Acute hypercapnic respiratory failure status post surgery 3. End-stage renal disease on hemodialysis neck on diabetes mellitus 4. Obstructive sleep apnea 5. Obesity hypoventilation syndrome 6. Chronic systolic congestive heart failure 7. Aortoiliac atherosclerosis  Discharge Condition: Improved Disposition: home  Diet recommendation: low salt, fluid restriction, diabetic diet  Filed Weights   03/19/15 0400 03/19/15 0945 03/20/15 0500  Weight: 112.9 kg (248 lb 14.4 oz) 112.9 kg (248 lb 14.4 oz) 114.6 kg (252 lb 10.4 oz)    History of present illness:  64 year old man presented with increasing rectal pain and abdominal tenderness as well as fever. Imaging revealed perirectal abscess.   Hospital Course:  Mr. Kessenich was placed on antibiotics. He was seen in consultation with general surgery and underwent incision and drainage of perianal abscess 3/29. Postoperatively he had difficulty ventilating and was subsequently reintubated. Difficulty was thought to be related to muscle relaxant and narcotic given intraoperatively. He was extubated within 24 hours and has done well postoperatively. He has been cleared for discharge by  surgery and pulmonology.  1. Acute hypercapnic respiratory failure status post surgery. Resolved. Successfully extubated 3/30. (Had to be reintubated status post surgery). Suspect multifactorial including obstructive sleep apnea, obesity hypoventilation syndrome, narcotics, benzodiazepines given during surgery.  2. Perianal abscess with fever on admission. Improving. Afebrile >48 hours. Status post incision and drainage 3/29. Normal lactic acid. 3. DM, remains stable. 4. ESRD on HD MWF. CT findings suggestive of bilateral polycystic kidney disease. Management per nephrology.  5. PAF, atrial flutter s/p ablation 05/2014, 08/2014; reports in SR since. EKG shows sinus rhythm. Patient not on anticoagulation. 6. Chronic systolic congestive heart failure LVEF 35-40 percent, normal wall motion 06/18/2014. Remains stable. 7. OSA, obesity hypoventilation syndrome 8. Aortoiliac atherosclerosis.   Continue empiric abx, f/u culture data. Discussed dosing and schedule with pharmacist Scott--recommends cipro 250mg  daily (give after HD on HD days) and metronidazole 500 mg BID  Consultants:  General surgery  Nephrology  Pulmonology  Procedures:  ETT 3/29 >> 3/30  Antibiotics:  Ciprofloxacin 3/28 >> 4/3  Flagyl 3/28 >> 4/3  Discharge Instructions  Discharge Instructions    Activity as tolerated - No restrictions    Complete by:  As directed      Diet - low sodium heart healthy    Complete by:  As directed      Diet Carb Modified    Complete by:  As directed      Discharge instructions    Complete by:  As directed   Call your physician or seek immediate medical attention for increased pain, drainage, fever or worsening of condition.     Discharge wound care:    Complete by:  As directed   Daily  sitz baths          Current Discharge Medication List    START taking these medications   Details  ciprofloxacin (CIPRO) 250 MG tablet Take 1 tablet (250 mg total) by mouth every  evening. Qty: 4 tablet, Refills: 0    metroNIDAZOLE (FLAGYL) 500 MG tablet Take 1 tablet (500 mg total) by mouth every 12 (twelve) hours. Qty: 7 tablet, Refills: 0      CONTINUE these medications which have NOT CHANGED   Details  acetaminophen (TYLENOL) 325 MG tablet Take 650 mg by mouth every 6 (six) hours as needed for moderate pain.    albuterol (PROVENTIL HFA;VENTOLIN HFA) 108 (90 BASE) MCG/ACT inhaler Inhale 2 puffs into the lungs every 6 (six) hours as needed for wheezing. Qty: 1 Inhaler, Refills: 2    albuterol (PROVENTIL) (2.5 MG/3ML) 0.083% nebulizer solution Take 3 mLs (2.5 mg total) by nebulization every 6 (six) hours as needed for wheezing or shortness of breath. Qty: 75 mL, Refills: 0    allopurinol (ZYLOPRIM) 100 MG tablet Take 100 mg by mouth daily.     B Complex-C-Folic Acid (RENAL MULTIVITAMIN FORMULA PO) Take 1 tablet by mouth daily.    cetirizine (ZYRTEC) 10 MG tablet Take 10 mg by mouth daily.     cinacalcet (SENSIPAR) 90 MG tablet Take 90 mg by mouth daily.    docusate sodium (COLACE) 100 MG capsule Take 200 mg by mouth 2 (two) times daily as needed for moderate constipation.     fish oil-omega-3 fatty acids 1000 MG capsule Take 2 g by mouth 3 (three) times daily. SUPER OMEGA 3 FORMULA: EPA 300/DHA20    gabapentin (NEURONTIN) 100 MG capsule Take 1 capsule (100 mg total) by mouth 2 (two) times daily. Qty: 60 capsule, Refills: 3    gemfibrozil (LOPID) 600 MG tablet Take 600 mg by mouth 2 (two) times daily.    HYDROcodone-acetaminophen (NORCO/VICODIN) 5-325 MG per tablet Take 2 tablets by mouth every 4 (four) hours as needed. Qty: 10 tablet, Refills: 0    insulin glargine (LANTUS) 100 UNIT/ML injection Inject 22 Units into the skin at bedtime.     ipratropium (ATROVENT) 0.02 % nebulizer solution Take 2.5 mLs (0.5 mg total) by nebulization 4 (four) times daily. Qty: 75 mL, Refills: 0    pantoprazole (PROTONIX) 40 MG tablet Take 40 mg by mouth daily.     sevelamer (RENVELA) 800 MG tablet Take 2,400-4,800 mg by mouth See admin instructions. Takes 6 tablets (4800 mg) three times daily with meals and 3 tablets (2400 mg) with snack.    PRESCRIPTION MEDICATION Place 1 drop into both eyes 4 (four) times daily. For dry eyes      STOP taking these medications     hydrocortisone (ANUSOL-HC) 25 MG suppository      magnesium citrate SOLN        Allergies  Allergen Reactions  . Celebrex [Celecoxib] Other (See Comments)    Heart sped up  . Penicillins Other (See Comments)    Couldn't walk.   . Actos [Pioglitazone] Other (See Comments)    unknown    The results of significant diagnostics from this hospitalization (including imaging, microbiology, ancillary and laboratory) are listed below for reference.    Significant Diagnostic Studies: Ct Abdomen Pelvis Wo Contrast  03/17/2015   CLINICAL DATA:  Acute rectal pain, fever, diarrhea .  EXAM: CT ABDOMEN AND PELVIS WITHOUT CONTRAST  TECHNIQUE: Multidetector CT imaging of the abdomen and pelvis was performed following the  standard protocol without IV contrast.  COMPARISON:  04/10/2012, 01/21/2012  FINDINGS: Lower chest: Minor scattered basilar atelectasis. Normal heart size. No pericardial or pleural effusion. Small hiatal hernia suspected. Degenerative changes of the lower thoracic spine.  Abdomen: Gallbladder is not visualized. Liver, biliary system, pancreas, and spleen within normal limits for age and noncontrast imaging.  Kidneys demonstrate extensive polycystic kidney disease. Several cysts demonstrate hyper attenuation compatible with protein or hemorrhage. Several cysts are also calcified. No renal obstruction or hydronephrosis. Stable mild thickening of the adrenal glands bilaterally, suspect hyperplasia.  Negative for bowel obstruction, dilatation, ileus, or free air. Postop changes of the abdominal wall. No ventral hernia evident.  Normal appendix in right lower quadrant.  No abdominal free  fluid, fluid collection, hemorrhage, abscess, or adenopathy.  Aortic iliac atherosclerosis noted without aneurysm or retroperitoneal hemorrhage.  Pelvis: Colonic diverticulosis noted diffusely. No pelvic free fluid, fluid collection, hemorrhage, or adenopathy. Vascular calcifications noted. Small fat containing inguinal hernias bilaterally. In the rectal region, there is a left perirectal low-attenuation area extending into the gluteal soft tissues. This roughly measures 4.2 x 2.5 cm, image 15 series 3. This is ill-defined by noncontrast imaging but suspicious for a perirectal abscess.  Diffuse degenerative changes of the visualized spine, SI joints and hips. No acute osseous finding.  IMPRESSION: 4.2 x 5.2 cm right perirectal thick-walled hypodense fluid collection by noncontrast imaging, suspect perirectal abscess.  Bilateral polycystic kidney disease.  Aortoiliac atherosclerosis without aneurysm  Colonic diverticulosis  Normal appendix  Fat containing inguinal hernias bilaterally   Electronically Signed   By: Jerilynn Mages.  Shick M.D.   On: 03/17/2015 20:56   Dg Chest 2 View  03/17/2015   CLINICAL DATA:  Acute fever, congestion cough  EXAM: CHEST  2 VIEW  COMPARISON:  07/15/2014  FINDINGS: Mild hyperinflation without focal pneumonia, collapse or consolidation. Negative for edema, effusion, or pneumothorax. Trachea midline. Normal heart size and vascularity. Degenerative changes of the spine diffusely. The trachea is midline. No acute osseous finding.  IMPRESSION: No acute chest process.   Electronically Signed   By: Jerilynn Mages.  Shick M.D.   On: 03/17/2015 20:23   Dg Abd 1 View  03/13/2015   CLINICAL DATA:  64 year old male with lower abdominal pain and constipation for 3 days. Initial encounter.  EXAM: ABDOMEN - 1 VIEW  COMPARISON:  CT Abdomen and Pelvis 01/30/2012. [facility] CT Abdomen and Pelvis 04/10/2012.  FINDINGS: Supine view. Polycystic kidney disease demonstrated in 2013. Interval progressed benign  appearing rim calcification of bilateral renal cysts. Non obstructed bowel gas pattern. Mild volume of retained stool in the visible colon including the rectum. Other abdominal and pelvic visceral contours are within normal limits. Chronic midline ventral abdomen skin staples and sutures. No acute osseous abnormality identified.  Aortoiliac calcified atherosclerosis noted.  IMPRESSION: 1. Normal bowel gas pattern.  Mild volume of retained stool. 2. Polycystic kidney disease re- identified.   Electronically Signed   By: Genevie Ann M.D.   On: 03/13/2015 10:45   Dg Chest Port 1 View  03/19/2015   CLINICAL DATA:  Respiratory failure.  EXAM: PORTABLE CHEST - 1 VIEW  COMPARISON:  03/18/2015 .  FINDINGS: Endotracheal tube and NG tube in stable position. Stable cardiomegaly. Persistent mild bibasilar atelectasis. No pleural effusion or pneumothorax. No acute osseus abnormality .  IMPRESSION: 1. Lines and tubes in stable position. 2. Persistent cardiomegaly. 3. Mild persist bibasilar atelectasis.   Electronically Signed   By: Marcello Moores  Register   On: 03/19/2015 09:32  Dg Chest Port 1 View  03/18/2015   CLINICAL DATA:  Evaluate endotracheal tube position  EXAM: PORTABLE CHEST - 1 VIEW  COMPARISON:  03/17/2015  FINDINGS: Endotracheal tube tip is 4 cm above the carinal. NG tube crosses the gastroesophageal junction with tip not visualized. Mild to moderate bilateral lower lobe atelectasis.  IMPRESSION: Support devices as described.  Bibasilar atelectasis.   Electronically Signed   By: Skipper Cliche M.D.   On: 03/18/2015 16:48    Microbiology: Recent Results (from the past 240 hour(s))  Culture, blood (routine x 2)     Status: None (Preliminary result)   Collection Time: 03/17/15  5:36 PM  Result Value Ref Range Status   Specimen Description BLOOD RIGHT HAND  Final   Special Requests BOTTLES DRAWN AEROBIC AND ANAEROBIC 6CC  Final   Culture PENDING  Incomplete   Report Status PENDING  Incomplete  Culture, blood  (routine x 2)     Status: None (Preliminary result)   Collection Time: 03/17/15  9:45 PM  Result Value Ref Range Status   Specimen Description BLOOD RIGHT HAND  Final   Special Requests BOTTLES DRAWN AEROBIC AND ANAEROBIC 6CC  Final   Culture PENDING  Incomplete   Report Status PENDING  Incomplete  Surgical pcr screen     Status: None   Collection Time: 03/18/15 10:45 AM  Result Value Ref Range Status   MRSA, PCR NEGATIVE NEGATIVE Final   Staphylococcus aureus NEGATIVE NEGATIVE Final    Comment:        The Xpert SA Assay (FDA approved for NASAL specimens in patients over 20 years of age), is one component of a comprehensive surveillance program.  Test performance has been validated by [facility] for patients greater than or equal to 37 year old. It is not intended to diagnose infection nor to guide or monitor treatment.   Anaerobic culture     Status: None (Preliminary result)   Collection Time: 03/18/15  1:30 PM  Result Value Ref Range Status   Specimen Description PERIRECTAL  Final   Special Requests NONE  Final   Gram Stain   Final    FEW WBC PRESENT, PREDOMINANTLY PMN NO SQUAMOUS EPITHELIAL CELLS SEEN FEW GRAM POSITIVE COCCI IN PAIRS Performed at [facility]    Culture   Final    NO ANAEROBES ISOLATED; CULTURE IN PROGRESS FOR 5 DAYS Performed at [facility]    Report Status PENDING  Incomplete  Culture, routine-abscess     Status: None (Preliminary result)   Collection Time: 03/18/15  1:30 PM  Result Value Ref Range Status   Specimen Description PERIRECTAL  Final   Special Requests NONE  Final   Gram Stain   Final    FEW WBC PRESENT, PREDOMINANTLY PMN NO SQUAMOUS EPITHELIAL CELLS SEEN FEW GRAM POSITIVE COCCI IN PAIRS Performed at [facility]    Culture   Final    Multiple Species Consistent With Normal Fecal Flora Performed at [facility]    Report Status PENDING  Incomplete     Labs: Basic Metabolic  Panel:  Recent Labs Lab 03/17/15 1735 03/18/15 0552 03/19/15 0945  NA 136 137 135  K 3.9 4.0 4.6  CL 96 95* 95*  CO2 27 28 25   GLUCOSE 110* 70 70  BUN 21 25* 40*  CREATININE 8.70* 9.81* 11.90*  CALCIUM 8.6 8.5 8.4   Liver Function Tests:  Recent Labs Lab 03/17/15 1735 03/18/15 0552  AST 34 44*  ALT 32 25  ALKPHOS 72 69  BILITOT 0.6 0.6  PROT 7.0 6.4  ALBUMIN 3.1* 2.9*   CBC:  Recent Labs Lab 03/17/15 1735 03/18/15 0552 03/19/15 0945  WBC 12.4* 10.4 12.5*  NEUTROABS 10.2*  --   --   HGB 13.6 12.2* 11.6*  HCT 41.3 37.9* 35.5*  MCV 96.0 95.7 94.2  PLT 176 170 162     Recent Labs  06/17/14 1940 07/16/14 0813  PROBNP 2925.0* 9235.0*    CBG:  Recent Labs Lab 03/18/15 0105 03/18/15 0751 03/18/15 1029 03/18/15 1221 03/18/15 1357  GLUCAP 86 78 72 82 126*    Principal Problem:   Perirectal abscess Active Problems:   DM type 2 causing ESRD   Gout   Obesity (BMI 30-39.9)   Essential hypertension   ESRD   GERD (gastroesophageal reflux disease)   History of atrial fibrillation   Acute respiratory failure with hypercapnia   Time coordinating discharge: 35 minutes  Signed:  [name], MD [facility] 03/20/2015, 10:07 AM

## 2015-03-20 NOTE — Progress Notes (Signed)
He is doing well. He is off the ventilator. He does have sleep apnea and uses C Pap at night. He will continue this. I will sign off at this point. Thanks for allowing me to see him with you

## 2015-03-20 NOTE — Addendum Note (Signed)
Addendum  created 03/20/15 0809 by Mickel Baas, CRNA   Modules edited: Notes Section   Notes Section:  File: [id number]; Pend: [id number]; Pend: [id number]

## 2015-03-20 NOTE — Progress Notes (Signed)
Subjective: Interval History: has no complaint of nausea or vomiting. Patient also denies any difficulty breathing. He doesn't have any pain however he doesn't move his bowels. There is no history of bleeding..  Objective: Vital signs in last 24 hours: Temp:  [97 F (36.1 C)-98.5 F (36.9 C)] 97.8 F (36.6 C) (03/31 0731) Pulse Rate:  [62-107] 69 (03/31 0800) Resp:  [9-19] 16 (03/31 0800) BP: (62-165)/(18-62) 126/42 mmHg (03/31 0800) SpO2:  [90 %-100 %] 96 % (03/31 0800) FiO2 (%):  [32 %-40 %] 32 % (03/31 0800) Weight:  [112.9 kg (248 lb 14.4 oz)-114.6 kg (252 lb 10.4 oz)] 114.6 kg (252 lb 10.4 oz) (03/31 0500) Weight change: 0 kg (0 lb)  Intake/Output from previous day: 03/30 0701 - 03/31 0700 In: 700.3 [I.V.:300.3; IV Piggyback:400] Out: 3100  Intake/Output this shift:    General appearance: alert, cooperative and no distress Resp: clear to auscultation bilaterally Cardio: regular rate and rhythm, S1, S2 normal, no murmur, click, rub or gallop GI: soft, non-tender; bowel sounds normal; no masses,  no organomegaly Extremities: edema Trace bilateral edema  Lab Results:  Recent Labs  03/18/15 0552 03/19/15 0945  WBC 10.4 12.5*  HGB 12.2* 11.6*  HCT 37.9* 35.5*  PLT 170 162   BMET:  Recent Labs  03/18/15 0552 03/19/15 0945  NA 137 135  K 4.0 4.6  CL 95* 95*  CO2 28 25  GLUCOSE 70 70  BUN 25* 40*  CREATININE 9.81* 11.90*  CALCIUM 8.5 8.4   No results for input(s): PTH in the last 72 hours. Iron Studies: No results for input(s): IRON, TIBC, TRANSFERRIN, FERRITIN in the last 72 hours.  Studies/Results: Dg Chest Port 1 View  03/19/2015   CLINICAL DATA:  Respiratory failure.  EXAM: PORTABLE CHEST - 1 VIEW  COMPARISON:  03/18/2015 .  FINDINGS: Endotracheal tube and NG tube in stable position. Stable cardiomegaly. Persistent mild bibasilar atelectasis. No pleural effusion or pneumothorax. No acute osseus abnormality .  IMPRESSION: 1. Lines and tubes in stable  position. 2. Persistent cardiomegaly. 3. Mild persist bibasilar atelectasis.   Electronically Signed   By: Marcello Moores  Register   On: 03/19/2015 09:32   Dg Chest Port 1 View  03/18/2015   CLINICAL DATA:  Evaluate endotracheal tube position  EXAM: PORTABLE CHEST - 1 VIEW  COMPARISON:  03/17/2015  FINDINGS: Endotracheal tube tip is 4 cm above the carinal. NG tube crosses the gastroesophageal junction with tip not visualized. Mild to moderate bilateral lower lobe atelectasis.  IMPRESSION: Support devices as described.  Bibasilar atelectasis.   Electronically Signed   By: Skipper Cliche M.D.   On: 03/18/2015 16:48    I have reviewed the patient's current medications.  Assessment/Plan: Problem #1 rectal abscess: Status post surgical drainage. Presently he is on antibiotics. He is a febrile. Problem #2 end-stage renal disease: He is status post hemodialysis yesterday. Presently he doesn't have any nausea or vomiting. His potassium predialysis was normal. Problem #3 history of diabetes Problem #4 history of atrial fibrillation his heart rate is controlled Problem #5 history of sleep apnea: Presently his own as her oxygen and no difficulty breathing Problem #6 metabolic bone disease his calcium is a range.  Problem #7 history of obesity Problem #8 anemia: His hemoglobin has come down to within our target goal. Plan: Patient does require dialysis today We'll make arrangements for patient to get dialysis tomorrow. He is going to be discharged to be done as an outpatient.     LOS: 3  days   Sorren Vallier S 03/20/2015,9:13 AM

## 2015-03-20 NOTE — Progress Notes (Signed)
Paged Baltazar Najjar, NP for order to place patient on CPAP at bedtime; currently on 3L O2 [place] with O2 sats dropping into the mid 80's when he is asleep; patient wear CPAP at home; orders received; RT called and updated about order; will continue to monitor and document any changes in patient condition

## 2015-03-20 NOTE — Progress Notes (Signed)
Patient is doing well off the ventilator. No significant bloody drainage noted from rectum. Initial cultures are consistent with normal fecal flora. Okay for discharge from surgery standpoint. Would do daily sitz baths. Can follow-up with me as needed. Would consider ciprofloxacin and Flagyl (or similar antibiotic regimen) for antibiotic coverage for 7-10 days.

## 2015-03-20 NOTE — Care Management Utilization Note (Signed)
UR completed 

## 2015-03-20 NOTE — Discharge Instructions (Signed)
Sitz Bath A sitz bath is a warm water bath taken in the sitting position. The water covers only the hips and butt (buttocks). It may be used for either healing or cleaning purposes. Sitz baths are also used to relieve pain, itching, or muscle tightening (spasms). The water may contain medicine. Moist heat will help you heal and relax.  HOME CARE  Take 3 to 4 sitz baths a day. 1. Fill the bathtub half-full with warm water. 2. Sit in the water and open the drain a little. 3. Turn on the warm water to keep the tub half-full. Keep the water running constantly. 4. Soak in the water for 15 to 20 minutes. 5. After the sitz bath, pat the affected area dry. GET HELP RIGHT AWAY IF: You get worse instead of better. Stop the sitz baths if you get worse. MAKE SURE YOU:  Understand these instructions.  Will watch your condition.  Will get help right away if you are not doing well or get worse. Document Released: 01/13/2005 Document Revised: 08/30/2012 Document Reviewed: 04/05/2011 [facility] Patient Information 2015 [place], [place]. This information is not intended to replace advice given to you by your health care provider. Make sure you discuss any questions you have with your health care provider.

## 2015-03-21 LAB — GLUCOSE, CAPILLARY
GLUCOSE-CAPILLARY: 111 mg/dL — AB (ref 70–99)
GLUCOSE-CAPILLARY: 94 mg/dL (ref 70–99)
GLUCOSE-CAPILLARY: 85 mg/dL (ref 70–99)
Glucose-Capillary: 123 mg/dL — ABNORMAL HIGH (ref 70–99)
Glucose-Capillary: 125 mg/dL — ABNORMAL HIGH (ref 70–99)
Glucose-Capillary: 133 mg/dL — ABNORMAL HIGH (ref 70–99)
Glucose-Capillary: 66 mg/dL — ABNORMAL LOW (ref 70–99)
Glucose-Capillary: 69 mg/dL — ABNORMAL LOW (ref 70–99)
Glucose-Capillary: 98 mg/dL (ref 70–99)

## 2015-03-22 LAB — CULTURE, BLOOD (ROUTINE X 2)
CULTURE: NO GROWTH
Culture: NO GROWTH

## 2015-03-24 LAB — CULTURE, ROUTINE-ABSCESS

## 2015-03-25 LAB — ANAEROBIC CULTURE

## 2015-03-30 ENCOUNTER — Encounter: Payer: Self-pay | Admitting: Family Medicine

## 2015-03-30 DIAGNOSIS — R509 Fever, unspecified: Secondary | ICD-10-CM | POA: Insufficient documentation

## 2015-03-30 NOTE — Progress Notes (Signed)
   Subjective:    Patient ID: Albert Lewis, male    DOB: [birth year], 64 y.o.   MRN: [id number]  HPI Pt in for Ed follow up for hemorrhoids.  Here c/o 1 week h/o intermitent fever, chills , temp up to 103. C/o fatigue, malaise and poor appetite since acute illness, feels as though he is getting worse. Denies vomit, c/o rectal pain, he is anuric    Review of Systems See HPI  Denies sinus pressure, nasal congestion, ear pain or sore throat. Denies chest congestion, productive cough or wheezing. Denies chest pains, palpitations and leg swelling  Chronic joint pain, and limitation in mobility. Denies headaches, seizures, numbness, or tingling. Denies depression, anxiety or insomnia. Denies skin break down or rash.        Objective:   Physical Exam  BP 146/74 mmHg  Pulse 100  Temp(Src) 102 F (38.9 C)  Resp 18  Ht 6\' 2"  (1.88 m)  Wt 252 lb (114.306 kg)  BMI 32.34 kg/m2  SpO2 90% Patient ill appearing, febrile and experiencing chills oriented and alert  HEENT: No facial asymmetry, EOMI,   oropharynx pink and moist.  Neck supple no JVD, no mass.  Chest: Clear to auscultation bilaterally.  CVS: S1, S2 no murmurs, no S3.Regular rate.  ABD: Soft non tender.  Rectal exam : not done Ext: No edema    CNS: CN 2-12 intact, power,  normal throughout.no focal deficits noted.       Assessment & Plan:  Fever and chills Recurrent fever and chills for 1 week, c/o rectal pain, refer to ED for re evaluation, recently d/c of hemorhoids and here for f/u of same, however pt remains ill and c/o high fever which is documented at this visit   Essential hypertension UnControlled, no change in medication due to acute illnes and reduced po intake will address at follow up    ESRD on hemodialysis dialysed 3 times weekly reports being febrile earlier today at dialysis

## 2015-03-30 NOTE — Assessment & Plan Note (Signed)
Recurrent fever and chills for 1 week, c/o rectal pain, refer to ED for re evaluation, recently d/c of hemorhoids and here for f/u of same, however pt remains ill and c/o high fever which is documented at this visit

## 2015-03-30 NOTE — Assessment & Plan Note (Addendum)
UnControlled, no change in medication due to acute illnes and reduced po intake will address at follow up

## 2015-03-30 NOTE — Assessment & Plan Note (Signed)
dialysed 3 times weekly reports being febrile earlier today at dialysis

## 2015-04-20 DEATH — deceased

## 2015-07-10 ENCOUNTER — Ambulatory Visit: Payer: Self-pay | Admitting: *Deleted

## 2015-07-10 DIAGNOSIS — Z7902 Long term (current) use of antithrombotics/antiplatelets: Secondary | ICD-10-CM

## 2015-07-10 DIAGNOSIS — I4891 Unspecified atrial fibrillation: Secondary | ICD-10-CM

## 2015-07-24 NOTE — Patient Outreach (Signed)
[place]) Care Management  07/24/2015  COBAIN MORICI 1951-09-16 [id number]   Referral from [place] List, assigned Sherrin Daisy, RN to outreach.  Ronnell Freshwater. [place], [place] Management [place] Assistant Phone: [phone] Fax: [phone]

## 2015-08-12 ENCOUNTER — Other Ambulatory Visit: Payer: Self-pay | Admitting: *Deleted

## 2015-08-12 ENCOUNTER — Encounter: Payer: Self-pay | Admitting: *Deleted

## 2015-08-12 NOTE — Patient Outreach (Addendum)
[place] [facility]) Care Management  08/12/2015  ANDERSSON LARRABEE May 25, 1951 [id number]   Referral per High Risk List:  Per Epic documention patient is deceased.   Plan: will close case; send MD closure letter.  Sherrin Daisy, RN BSN [place] Management Coordinator [facility] Care Management  [phone]

## 2015-08-13 NOTE — Patient Outreach (Signed)
[place] [place] [facility]) Care Management  08/13/2015  KIRIL HIPPE 1951/02/17 [id number]   Notification received from Sherrin Daisy, [facility] to close case due to patient being deceased.  Kaheem Halleck L. Latalia Etzler, [place] Care Management Assistant

## 2016-02-09 IMAGING — CT CT ABD-PELV W/O CM
2 of 7 series · 14 of 46 positions shown, 18 images · non-contrast
Comparison: 04/10/2012, 01/21/2012

CLINICAL DATA: Acute rectal pain, fever, diarrhea .

EXAM:
CT ABDOMEN AND PELVIS WITHOUT CONTRAST
TECHNIQUE: Multidetector CT imaging of the abdomen and pelvis was performed
following the standard protocol without IV contrast.

[Series 2: abdomen/pelvis w/o contrast · axial · non-contrast · 0.91mm/px · z∈[-494,-88]mm · 11 of 96 slices shown, 15 images]
[im 10/96  soft-tissue]
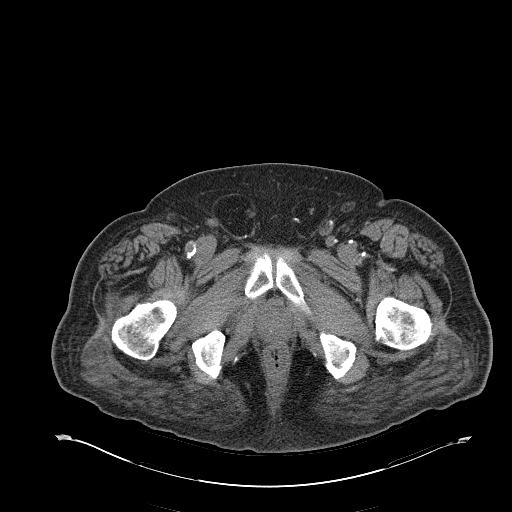
[im 10/96  bone]
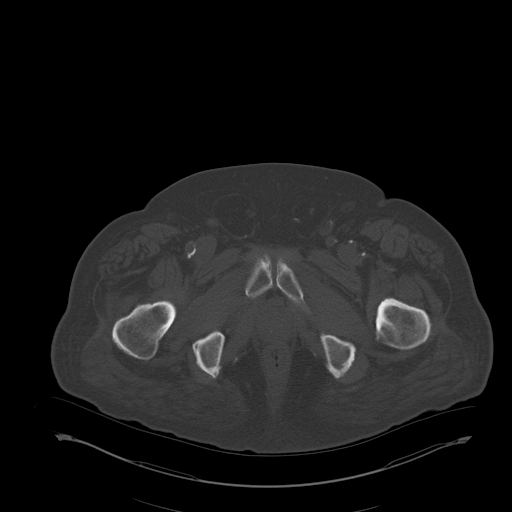
[im 20/96  soft-tissue]
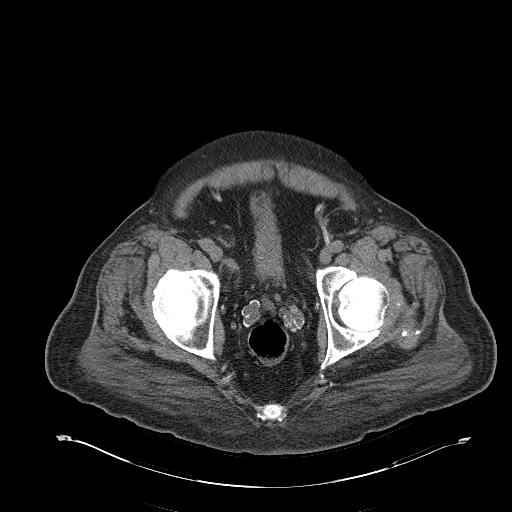
[im 29/96  soft-tissue]
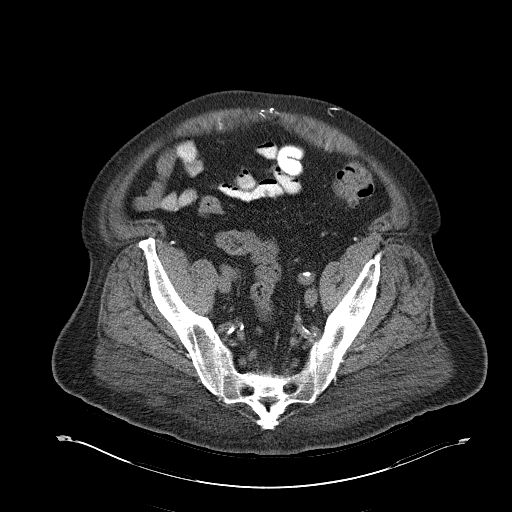
[im 39/96  soft-tissue]
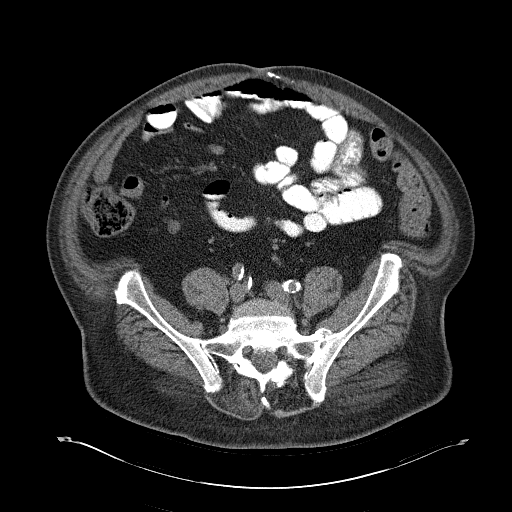
[im 48/96  soft-tissue]
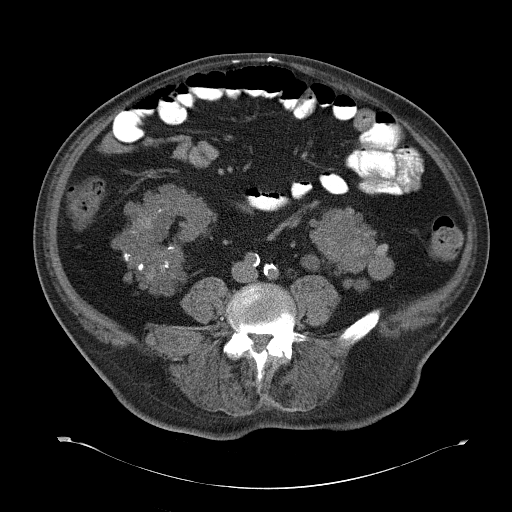
[im 58/96  soft-tissue]
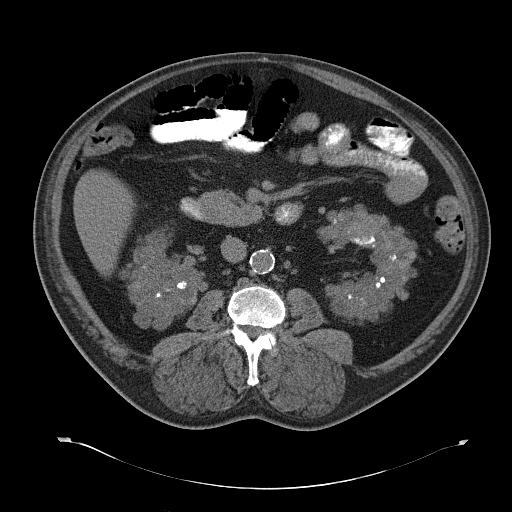
[im 67/96  soft-tissue]
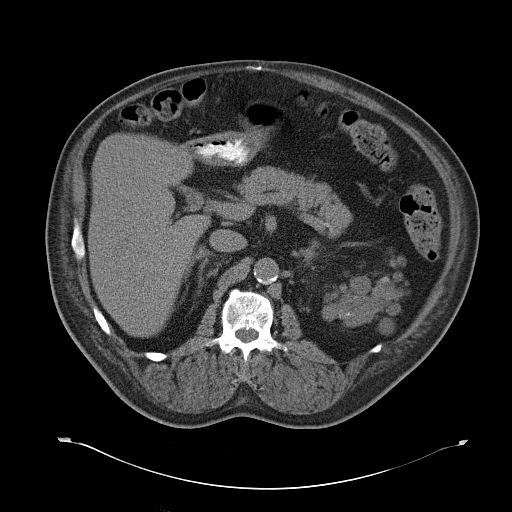
[im 77/96  soft-tissue]
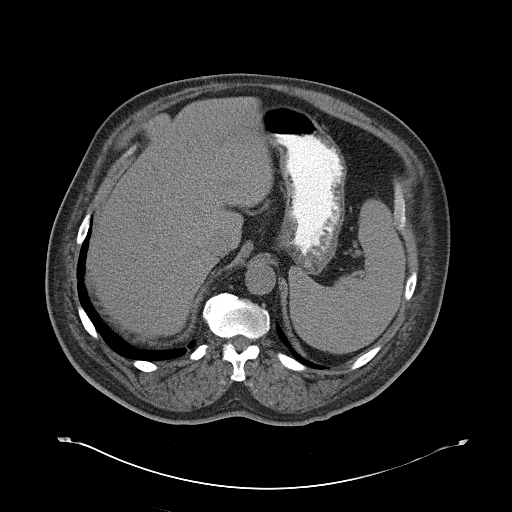
[im 77/96  lung]
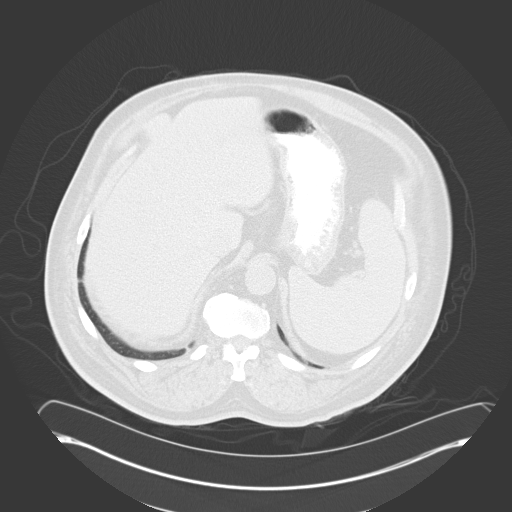
[im 81/96  lung]
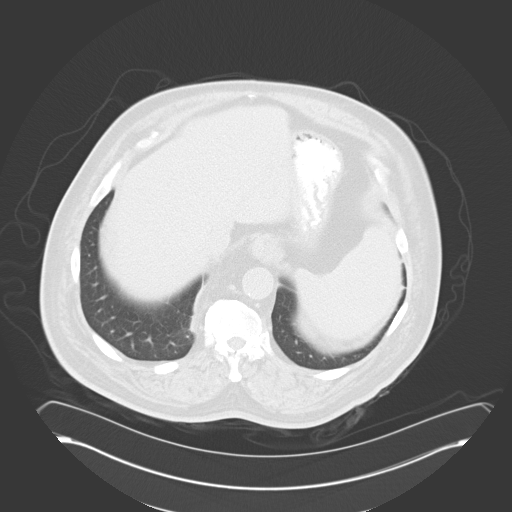
[im 86/96  soft-tissue]
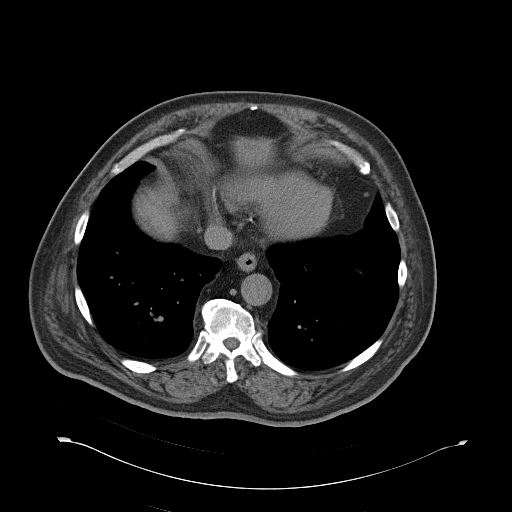
[im 86/96  lung]
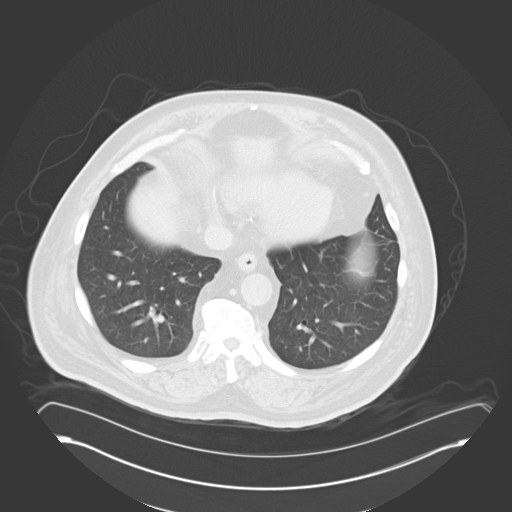
[im 86/96  bone]
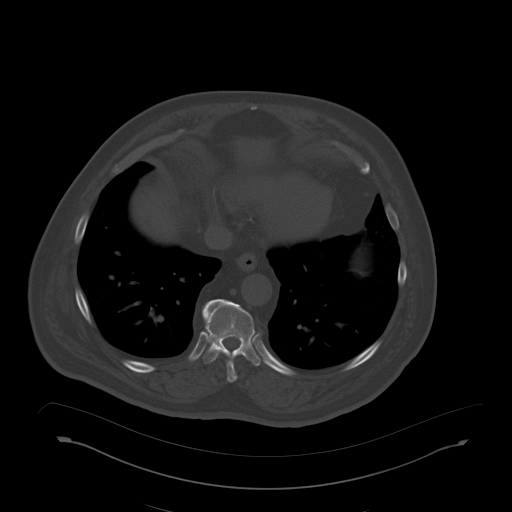
[im 91/96  lung]
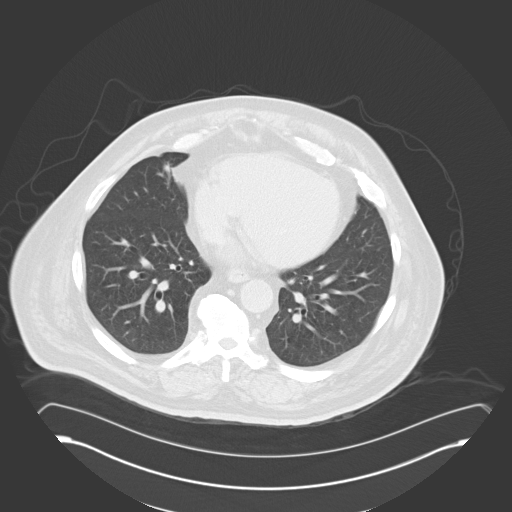

[Series 5: mpr cor (id) · coronal · 0.84mm/px · 3 of 114 slices shown]
[im 23/114  soft-tissue]
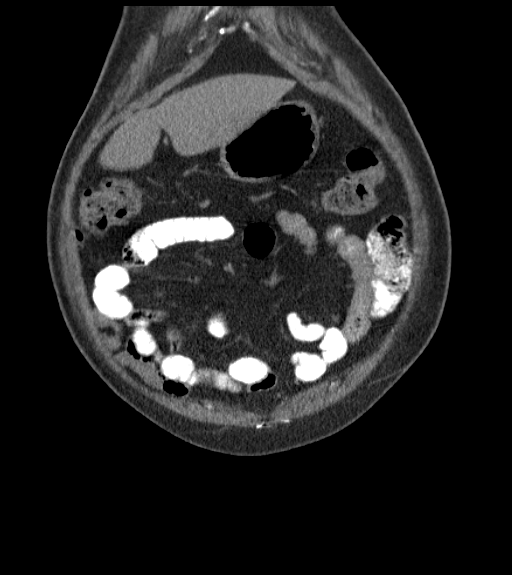
[im 46/114  soft-tissue]
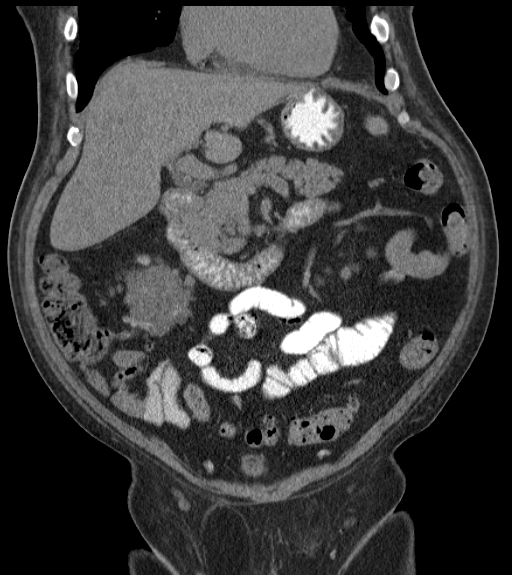
[im 68/114  soft-tissue]
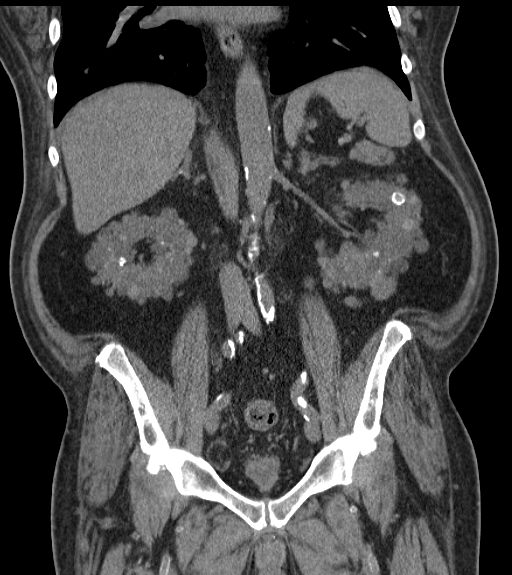

[14 of 46 positions shown; findings below may reference images not displayed]

FINDINGS: Lower chest: Minor scattered basilar atelectasis. Normal heart size.
No pericardial or pleural effusion. Small hiatal hernia suspected.
Degenerative changes of the lower thoracic spine.

Abdomen: Gallbladder is not visualized. Liver, biliary system,
pancreas, and spleen within normal limits for age and noncontrast
imaging.

Kidneys demonstrate extensive polycystic kidney disease. Several
cysts demonstrate hyper attenuation compatible with protein or
hemorrhage. Several cysts are also calcified. No renal obstruction
or hydronephrosis. Stable mild thickening of the adrenal glands
bilaterally, suspect hyperplasia.

Negative for bowel obstruction, dilatation, ileus, or free air.
Postop changes of the abdominal wall. No ventral hernia evident.

Normal appendix in right lower quadrant.

No abdominal free fluid, fluid collection, hemorrhage, abscess, or
adenopathy.

Aortic iliac atherosclerosis noted without aneurysm or
retroperitoneal hemorrhage.

Pelvis: Colonic diverticulosis noted diffusely. No pelvic free
fluid, fluid collection, hemorrhage, or adenopathy. Vascular
calcifications noted. Small fat containing inguinal hernias
bilaterally. In the rectal region, there is a left perirectal
low-attenuation area extending into the gluteal soft tissues. This
roughly measures 4.2 x 2.5 cm, image 15 series 3. This is
ill-defined by noncontrast imaging but suspicious for a perirectal
abscess.

Diffuse degenerative changes of the visualized spine, SI joints and
hips. No acute osseous finding.
IMPRESSION: 4.2 x 5.2 cm right perirectal thick-walled hypodense fluid
collection by noncontrast imaging, suspect perirectal abscess.

Bilateral polycystic kidney disease.

Aortoiliac atherosclerosis without aneurysm

Colonic diverticulosis

Normal appendix

Fat containing inguinal hernias bilaterally

## 2016-02-10 IMAGING — CR DG CHEST 1V PORT
1 series · 1 of 1 positions shown · non-contrast
Comparison: 03/17/2015

CLINICAL DATA: Evaluate endotracheal tube position

EXAM:
PORTABLE CHEST - 1 VIEW

[portable]
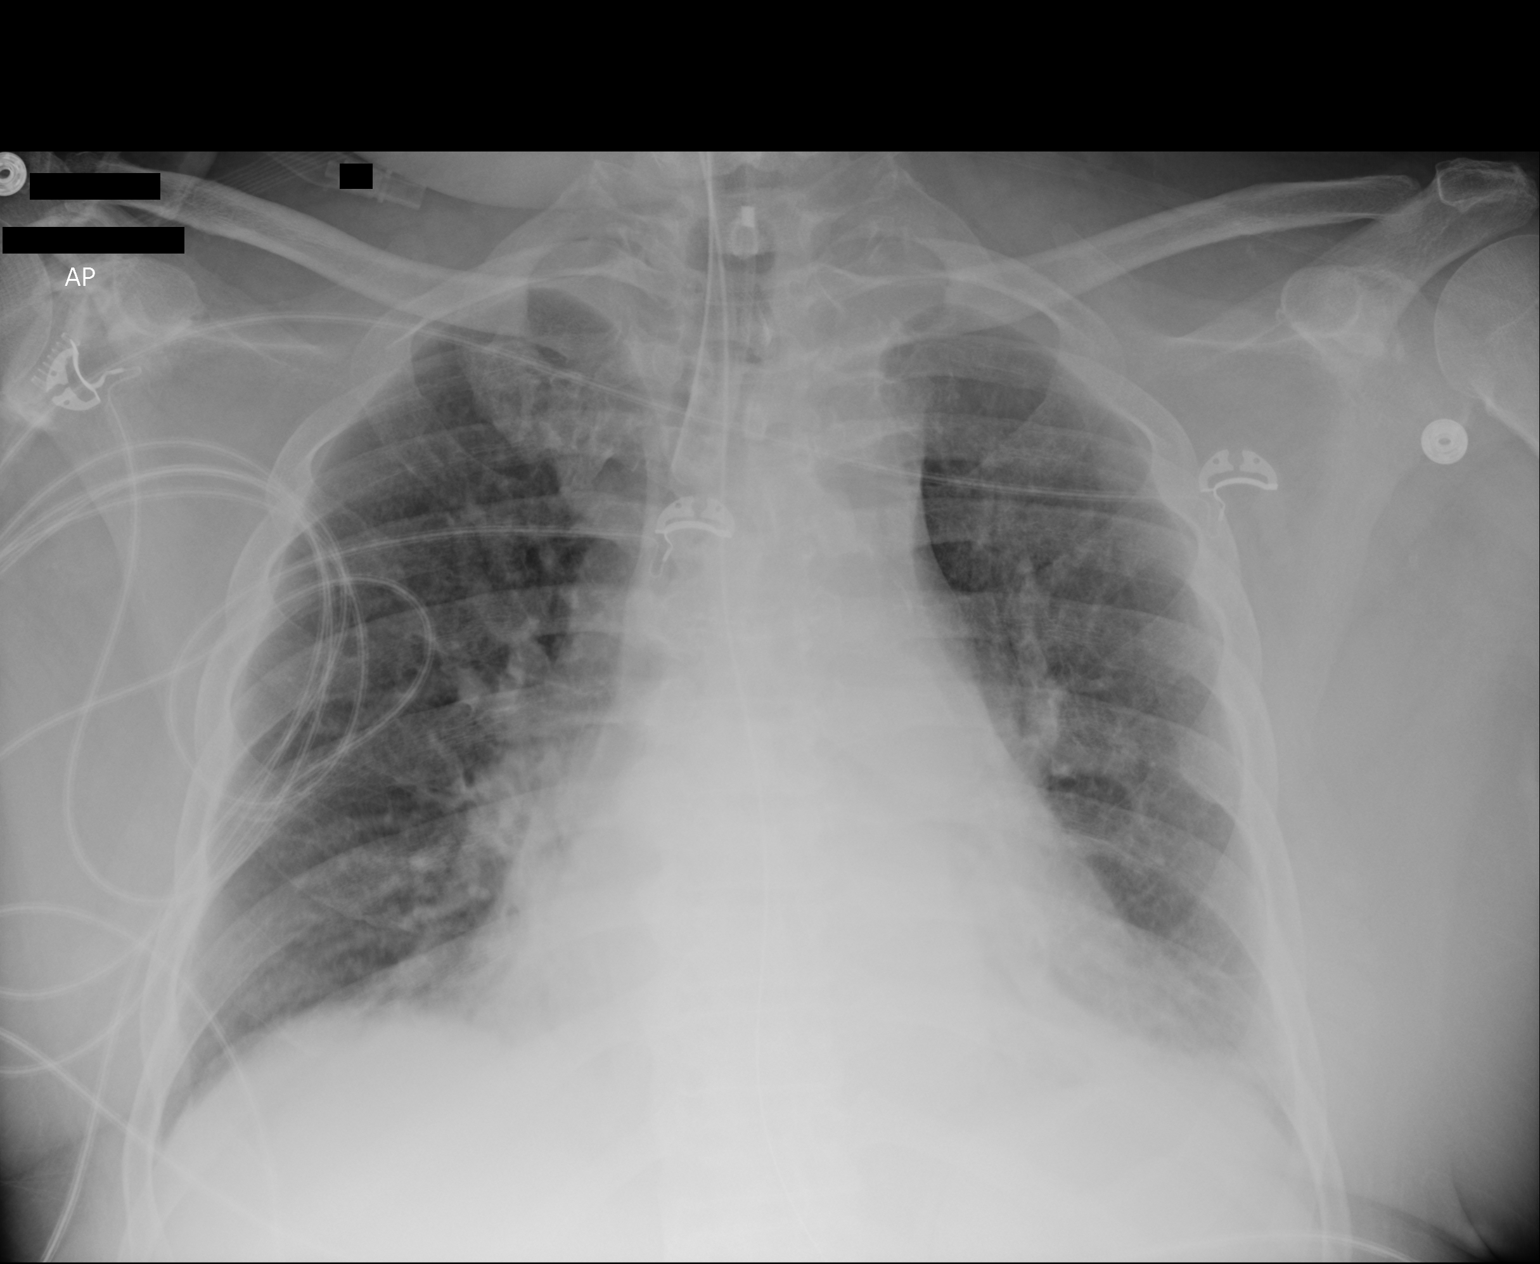

[1 of 1 positions shown; findings below may reference images not displayed]

FINDINGS: Endotracheal tube tip is 4 cm above the carinal. NG tube crosses the
gastroesophageal junction with tip not visualized. Mild to moderate
bilateral lower lobe atelectasis.
IMPRESSION: Support devices as described.  Bibasilar atelectasis.
